# Patient Record
Sex: Female | Born: 1935
Health system: Southern US, Community
[De-identification: ages and names within clinical notes are randomized; demographics above are authoritative.]

## PROBLEM LIST (undated history)

## (undated) DIAGNOSIS — E785 Hyperlipidemia, unspecified: Secondary | ICD-10-CM

## (undated) DIAGNOSIS — F419 Anxiety disorder, unspecified: Secondary | ICD-10-CM

## (undated) DIAGNOSIS — I10 Essential (primary) hypertension: Secondary | ICD-10-CM

## (undated) DIAGNOSIS — K219 Gastro-esophageal reflux disease without esophagitis: Secondary | ICD-10-CM

## (undated) DIAGNOSIS — R42 Dizziness and giddiness: Secondary | ICD-10-CM

## (undated) DIAGNOSIS — H269 Unspecified cataract: Secondary | ICD-10-CM

## (undated) HISTORY — DX: Dizziness and giddiness: R42

## (undated) HISTORY — DX: Gastro-esophageal reflux disease without esophagitis: K21.9

## (undated) HISTORY — DX: Essential (primary) hypertension: I10

## (undated) HISTORY — DX: Anxiety disorder, unspecified: F41.9

## (undated) HISTORY — PX: SQUAMOUS CELL CARCINOMA EXCISION: SHX2433

## (undated) HISTORY — PX: CATARACT EXTRACTION: SUR2

## (undated) HISTORY — PX: EYE SURGERY: SHX253

## (undated) HISTORY — PX: KNEE ARTHROSCOPY: SUR90

## (undated) HISTORY — PX: ROTATOR CUFF REPAIR: SHX139

## (undated) HISTORY — DX: Unspecified cataract: H26.9

## (undated) HISTORY — DX: Hyperlipidemia, unspecified: E78.5

## (undated) HISTORY — PX: TONSILLECTOMY: SUR1361

---

## 1978-08-28 HISTORY — PX: ABDOMINAL HYSTERECTOMY: SHX81

## 1999-04-28 ENCOUNTER — Other Ambulatory Visit: Admission: RE | Admit: 1999-04-28 | Discharge: 1999-04-28 | Payer: Self-pay | Admitting: Family Medicine

## 1999-05-19 ENCOUNTER — Inpatient Hospital Stay (HOSPITAL_COMMUNITY): Admission: RE | Admit: 1999-05-19 | Discharge: 1999-05-21 | Payer: Self-pay | Admitting: Obstetrics and Gynecology

## 2002-07-29 ENCOUNTER — Other Ambulatory Visit: Admission: RE | Admit: 2002-07-29 | Discharge: 2002-07-29 | Payer: Self-pay | Admitting: Obstetrics and Gynecology

## 2003-10-05 ENCOUNTER — Encounter: Admission: RE | Admit: 2003-10-05 | Discharge: 2003-10-05 | Payer: Self-pay | Admitting: Orthopedic Surgery

## 2004-09-06 ENCOUNTER — Other Ambulatory Visit: Admission: RE | Admit: 2004-09-06 | Discharge: 2004-09-06 | Payer: Self-pay | Admitting: Obstetrics and Gynecology

## 2009-01-19 ENCOUNTER — Ambulatory Visit: Payer: Self-pay | Admitting: Internal Medicine

## 2009-01-27 ENCOUNTER — Ambulatory Visit: Payer: Self-pay | Admitting: Internal Medicine

## 2010-02-03 ENCOUNTER — Ambulatory Visit (HOSPITAL_BASED_OUTPATIENT_CLINIC_OR_DEPARTMENT_OTHER): Admission: RE | Admit: 2010-02-03 | Discharge: 2010-02-04 | Payer: Self-pay | Admitting: Orthopedic Surgery

## 2010-11-14 LAB — POCT I-STAT 4, (NA,K, GLUC, HGB,HCT)
HCT: 42 % (ref 36.0–46.0)
Hemoglobin: 14.3 g/dL (ref 12.0–15.0)
Potassium: 3.7 mEq/L (ref 3.5–5.1)
Sodium: 142 mEq/L (ref 135–145)

## 2011-02-27 ENCOUNTER — Ambulatory Visit (HOSPITAL_BASED_OUTPATIENT_CLINIC_OR_DEPARTMENT_OTHER)
Admission: RE | Admit: 2011-02-27 | Discharge: 2011-02-27 | Disposition: A | Discharge status: Home / Self Care | Payer: PRIVATE HEALTH INSURANCE | Source: Ambulatory Visit | Attending: Orthopedic Surgery | Admitting: Orthopedic Surgery

## 2011-02-27 DIAGNOSIS — IMO0002 Reserved for concepts with insufficient information to code with codable children: Secondary | ICD-10-CM | POA: Insufficient documentation

## 2011-02-27 DIAGNOSIS — R9431 Abnormal electrocardiogram [ECG] [EKG]: Secondary | ICD-10-CM | POA: Insufficient documentation

## 2011-02-27 DIAGNOSIS — X58XXXA Exposure to other specified factors, initial encounter: Secondary | ICD-10-CM | POA: Insufficient documentation

## 2011-02-27 DIAGNOSIS — Z01810 Encounter for preprocedural cardiovascular examination: Secondary | ICD-10-CM | POA: Insufficient documentation

## 2011-02-27 DIAGNOSIS — S83289A Other tear of lateral meniscus, current injury, unspecified knee, initial encounter: Secondary | ICD-10-CM | POA: Insufficient documentation

## 2011-02-27 DIAGNOSIS — Z01812 Encounter for preprocedural laboratory examination: Secondary | ICD-10-CM | POA: Insufficient documentation

## 2011-02-27 LAB — POCT I-STAT 4, (NA,K, GLUC, HGB,HCT): HCT: 41 % (ref 36.0–46.0)

## 2011-03-09 NOTE — Op Note (Signed)
  NAMELEXANDRA, RETTKE             ACCOUNT NO.:  [id number]  MEDICAL RECORD NO.:  [id number]  LOCATION:                                 FACILITY:  PHYSICIAN:  Marlowe Kays, M.D.  DATE OF BIRTH:  24-May-1933  DATE OF PROCEDURE:  02/27/2011 DATE OF DISCHARGE:                              OPERATIVE REPORT   PREOPERATIVE DIAGNOSES:  Torn medial and lateral menisci, left knee.  POSTOPERATIVE DIAGNOSIS:  Torn medial and lateral menisci, left knee.  OPERATION:  Left knee arthroscopy with partial medial and lateral meniscectomy.  SURGEON:  Marlowe Kays, MD  ASSISTANT:  Nurse.  ANESTHESIA:  General.  PATHOLOGY AND JUSTIFICATION FOR PROCEDURE:  Because of painful left knee, she had an MRI performed on Jan 18, 2011, demonstrating the torn menisci and consequently she is here today for the above-mentioned surgery.  PROCEDURE:  Satisfied general anesthesia, Ace wrap and knee support to right lower extremity, pneumatic tourniquet applied to left lower extremity with leg Esmarch'd out nonsterilely and tourniquet inflated to 300 mmHg, thigh stabilizer applied and left leg was prepped with DuraPrep from stabilizer to ankle and draped in sterile field.  Time-out performed.  Superior medial saline inflow.  First through the anterolateral portal, medial compartment of knee joint was evaluated. There was some reactive synovitis anteriorly, which I resected with a 3.5 shaver.  ACL was noted to be intact.  Posteriorly, she had fairly extensive tear involving the entire posterior third, which I resected back to a stable rim with a combination of baskets and shaving down until smooth with a 3.5 shaver.  When this had been completed, I looked up the medial gutter and suprapatellar area.  There was some minimal wear of the patella, which was pictured but nothing arthroscopically treatable was noted.  I then reversed portals laterally.  There was a tear that appeared to be a small amount of  the lateral portion of the ACL, but also the anterior horn of the lateral meniscus as depicted on the MRI.  She also had fairly extensive tear along the mid and posterior curve areas of the lateral meniscus.  I shaved all this down until smooth with 3.5 shaver and also trimmed a small amount of the posterior curve irregularity with baskets.  The joint was then irrigated until clear and all fluid possibly removed.  I closed 2 entry portals with 4-0 nylon and then injected 20 cc of 0.5% Marcaine with adrenaline through the inflow apparatus which I closed, and I closed this portal with 4-0 nylon as well.  Betadine, Adaptic, dry sterile dressing were applied.  Tourniquet was released and at the time of this dictation she was on her way to recovery room in satisfactory condition with no known complications.          ______________________________ Marlowe Kays, M.D.     JA/MEDQ  D:  02/27/2011  T:  02/27/2011  Job:  [id number]  Electronically Signed by Marlowe Kays M.D. on 03/09/2011 12:17:27 PM

## 2012-04-22 ENCOUNTER — Other Ambulatory Visit: Payer: Self-pay | Admitting: Family Medicine

## 2012-04-22 DIAGNOSIS — Z1231 Encounter for screening mammogram for malignant neoplasm of breast: Secondary | ICD-10-CM

## 2012-05-14 ENCOUNTER — Ambulatory Visit: Payer: PRIVATE HEALTH INSURANCE

## 2013-01-08 ENCOUNTER — Ambulatory Visit (INDEPENDENT_AMBULATORY_CARE_PROVIDER_SITE_OTHER): Payer: Medicare Other | Admitting: Physician Assistant

## 2013-01-08 ENCOUNTER — Encounter: Payer: Self-pay | Admitting: Physician Assistant

## 2013-01-08 VITALS — BP 131/73 | HR 79 | Temp 98.2°F | Ht 64.5 in | Wt 134.0 lb

## 2013-01-08 DIAGNOSIS — I10 Essential (primary) hypertension: Secondary | ICD-10-CM | POA: Insufficient documentation

## 2013-01-08 DIAGNOSIS — K219 Gastro-esophageal reflux disease without esophagitis: Secondary | ICD-10-CM | POA: Insufficient documentation

## 2013-01-08 DIAGNOSIS — F411 Generalized anxiety disorder: Secondary | ICD-10-CM

## 2013-01-08 DIAGNOSIS — N959 Unspecified menopausal and perimenopausal disorder: Secondary | ICD-10-CM

## 2013-01-08 DIAGNOSIS — E785 Hyperlipidemia, unspecified: Secondary | ICD-10-CM

## 2013-01-08 LAB — BASIC METABOLIC PANEL WITH GFR
BUN: 10 mg/dL (ref 6–23)
CO2: 26 mEq/L (ref 19–32)
Calcium: 9.4 mg/dL (ref 8.4–10.5)
Creat: 0.69 mg/dL (ref 0.50–1.10)

## 2013-01-08 LAB — HEPATIC FUNCTION PANEL
ALT: 10 U/L (ref 0–35)
AST: 17 U/L (ref 0–37)
Albumin: 3.9 g/dL (ref 3.5–5.2)
Alkaline Phosphatase: 66 U/L (ref 39–117)
Total Bilirubin: 0.5 mg/dL (ref 0.3–1.2)
Total Protein: 6.5 g/dL (ref 6.0–8.3)

## 2013-01-08 LAB — LIPID PANEL
Cholesterol: 253 mg/dL — ABNORMAL HIGH (ref 0–200)
HDL: 61 mg/dL (ref >39)
LDL Cholesterol: 153 mg/dL — ABNORMAL HIGH (ref 0–99)
Triglycerides: 197 mg/dL — ABNORMAL HIGH (ref <150)

## 2013-01-08 MED ORDER — DIAZEPAM 2 MG PO TABS: 2.0000 mg | ORAL_TABLET | Freq: Two times a day (BID) | ORAL | Status: DC | PRN

## 2013-01-08 MED ORDER — LISINOPRIL 10 MG PO TABS: 10.0000 mg | ORAL_TABLET | Freq: Every day | ORAL | Status: DC

## 2013-01-08 MED ORDER — ESTRADIOL 1 MG PO TABS: 1.0000 mg | ORAL_TABLET | Freq: Every day | ORAL | Status: DC

## 2013-01-08 MED ORDER — OMEPRAZOLE 20 MG PO CPDR: 20.0000 mg | DELAYED_RELEASE_CAPSULE | Freq: Every day | ORAL | Status: DC

## 2013-01-08 NOTE — Patient Instructions (Signed)

## 2013-01-08 NOTE — Progress Notes (Signed)
Subjective:     Patient ID: Christina Silva, female   DOB: [birth year], 79 y.o.   MRN: [id number]  Hypertension This is a chronic problem. The current episode started more than 1 year ago. The problem has been resolved since onset. The problem is controlled. Risk factors for coronary artery disease include dyslipidemia, family history and post-menopausal state. Past treatments include ACE inhibitors and diuretics. There are no compliance problems.      Review of Systems  All other systems reviewed and are negative.       Objective:   Physical Exam  Neck: Trachea normal. No JVD present. Carotid bruit is not present.  Cardiovascular: Normal rate, regular rhythm, S1 normal, S2 normal and normal heart sounds.   Pulmonary/Chest: Effort normal and breath sounds normal.  No lower ext edema     Assessment:     1. GERD (gastroesophageal reflux disease)   2. Other and unspecified hyperlipidemia   3. HTN (hypertension)   4. Anxiety state, unspecified   5. Menopausal and perimenopausal disorder        Plan:     Cont current meds Complete labs today Appt for mammo/dexa Pt refuses colonoscopy F/U in 6 mos

## 2013-01-14 ENCOUNTER — Telehealth: Payer: Self-pay | Admitting: Physician Assistant

## 2013-01-14 NOTE — Telephone Encounter (Signed)
Called to discuss labs but patient was not at home.  Left message with her husband to have her call back tomorrow for results.

## 2013-01-15 ENCOUNTER — Telehealth: Payer: Self-pay | Admitting: Physician Assistant

## 2013-01-15 NOTE — Telephone Encounter (Signed)
Patient aware. Copy is sent in the mail she will review it and let us know later is she wants to go on a statin

## 2013-01-16 ENCOUNTER — Encounter: Payer: Self-pay | Admitting: *Deleted

## 2013-03-12 ENCOUNTER — Other Ambulatory Visit: Payer: Medicare Other

## 2013-03-12 ENCOUNTER — Ambulatory Visit: Payer: Medicare Other

## 2013-06-30 ENCOUNTER — Ambulatory Visit (INDEPENDENT_AMBULATORY_CARE_PROVIDER_SITE_OTHER): Payer: Medicare Other | Admitting: General Practice

## 2013-06-30 ENCOUNTER — Encounter: Payer: Self-pay | Admitting: General Practice

## 2013-06-30 VITALS — BP 140/76 | HR 80 | Temp 97.3°F | Ht 64.5 in | Wt 137.0 lb

## 2013-06-30 DIAGNOSIS — F411 Generalized anxiety disorder: Secondary | ICD-10-CM

## 2013-06-30 DIAGNOSIS — I10 Essential (primary) hypertension: Secondary | ICD-10-CM

## 2013-06-30 DIAGNOSIS — Z780 Asymptomatic menopausal state: Secondary | ICD-10-CM

## 2013-06-30 DIAGNOSIS — E785 Hyperlipidemia, unspecified: Secondary | ICD-10-CM

## 2013-06-30 DIAGNOSIS — N951 Menopausal and female climacteric states: Secondary | ICD-10-CM

## 2013-06-30 DIAGNOSIS — K219 Gastro-esophageal reflux disease without esophagitis: Secondary | ICD-10-CM

## 2013-06-30 DIAGNOSIS — Z09 Encounter for follow-up examination after completed treatment for conditions other than malignant neoplasm: Secondary | ICD-10-CM

## 2013-06-30 LAB — POCT CBC
Granulocyte percent: 55.6 %G (ref 37–80)
Lymph, poc: 1.6 (ref 0.6–3.4)
MCV: 86.1 fL (ref 80–97)
MPV: 6.7 fL (ref 0–99.8)
POC LYMPH PERCENT: 35.6 %L (ref 10–50)
Platelet Count, POC: 315 10*3/uL (ref 142–424)
RBC: 4.7 M/uL (ref 4.04–5.48)
RDW, POC: 13.1 %
WBC: 4.5 10*3/uL — AB (ref 4.6–10.2)

## 2013-06-30 MED ORDER — OMEPRAZOLE 20 MG PO CPDR: 20.0000 mg | DELAYED_RELEASE_CAPSULE | Freq: Every day | ORAL | Status: DC

## 2013-06-30 MED ORDER — LISINOPRIL 10 MG PO TABS: 10.0000 mg | ORAL_TABLET | Freq: Every day | ORAL | Status: DC

## 2013-06-30 MED ORDER — ESTRADIOL 1 MG PO TABS: 1.0000 mg | ORAL_TABLET | Freq: Every day | ORAL | Status: DC

## 2013-06-30 MED ORDER — DIAZEPAM 2 MG PO TABS: 2.0000 mg | ORAL_TABLET | Freq: Two times a day (BID) | ORAL | Status: DC | PRN

## 2013-06-30 NOTE — Patient Instructions (Signed)

## 2013-06-30 NOTE — Progress Notes (Signed)
  Subjective:    Patient ID: Christina Silva, female    DOB: 1932-08-31, 79 y.o.   MRN: [id number]  HPI Patient presents today for chronic health follow up. She has a history of hypertension, anxiety, vasomotor symptoms, and hyperlipidemia. She reports medications are effective and mood swings are well controlled.     Review of Systems  Constitutional: Negative for fever and chills.  Respiratory: Negative for cough, chest tightness, shortness of breath and wheezing.   Cardiovascular: Negative for chest pain and palpitations.  Gastrointestinal: Negative for abdominal pain, diarrhea, constipation and blood in stool.  Genitourinary: Negative for dysuria, hematuria and difficulty urinating.  Musculoskeletal: Negative for back pain, neck pain and neck stiffness.  Neurological: Negative for dizziness, weakness and headaches.       Objective:   Physical Exam  Constitutional: She is oriented to person, place, and time. She appears well-developed and well-nourished.  HENT:  Head: Normocephalic and atraumatic.  Right Ear: External ear normal.  Left Ear: External ear normal.  Nose: Nose normal.  Mouth/Throat: Oropharynx is clear and moist.  Eyes: EOM are normal. Pupils are equal, round, and reactive to light.  Neck: Normal range of motion. Neck supple. No thyromegaly present.  Cardiovascular: Normal rate, regular rhythm and normal heart sounds.   Pulmonary/Chest: Effort normal and breath sounds normal. No respiratory distress. She exhibits no tenderness.  Abdominal: Soft. Bowel sounds are normal. She exhibits no distension. There is no tenderness.  Musculoskeletal: She exhibits no edema and no tenderness.  Lymphadenopathy:    She has no cervical adenopathy.  Neurological: She is alert and oriented to person, place, and time.  Skin: Skin is warm and dry.  Psychiatric: She has a normal mood and affect.          Assessment & Plan:  1. Hyperlipidemia  - NMR, lipoprofile  2.  Hypertension  - CMP14+EGFR - lisinopril (PRINIVIL,ZESTRIL) 10 MG tablet; Take 1 tablet (10 mg total) by mouth daily.  Dispense: 30 tablet; Refill: 5  3. GERD (gastroesophageal reflux disease)  - omeprazole (PRILOSEC) 20 MG capsule; Take 1 capsule (20 mg total) by mouth daily.  Dispense: 30 capsule; Refill: 5  4. Generalized anxiety disorder  - diazepam (VALIUM) 2 MG tablet; Take 1 tablet (2 mg total) by mouth every 12 (twelve) hours as needed for anxiety.  Dispense: 60 tablet; Refill: 1  5. Follow-up exam, 3-6 months since previous exam  - POCT CBC  6. Menopause   7. Menopausal symptoms  - estradiol (ESTRACE) 1 MG tablet; Take 1 tablet (1 mg total) by mouth daily.  Dispense: 30 tablet; Refill: 5 -Continue all current medications Labs pending F/u in 6 months Discussed exercise and diet  Patient verbalized understanding Coralie Keens, FNP-C

## 2013-07-02 LAB — CMP14+EGFR
ALT: 9 IU/L (ref 0–32)
AST: 15 IU/L (ref 0–40)
CO2: 25 mmol/L (ref 18–29)
Calcium: 9.7 mg/dL (ref 8.6–10.2)
Chloride: 101 mmol/L (ref 97–108)
GFR calc non Af Amer: 84 mL/min/{1.73_m2} (ref >59)
Glucose: 89 mg/dL (ref 65–99)
Potassium: 4.6 mmol/L (ref 3.5–5.2)
Sodium: 141 mmol/L (ref 134–144)
Total Protein: 6.6 g/dL (ref 6.0–8.5)

## 2013-07-02 LAB — NMR, LIPOPROFILE
HDL Cholesterol by NMR: 71 mg/dL (ref >=40)
LDL Particle Number: 2238 nmol/L — ABNORMAL HIGH (ref <1000)
LDLC SERPL CALC-MCNC: 138 mg/dL — ABNORMAL HIGH (ref <100)
Small LDL Particle Number: 801 nmol/L — ABNORMAL HIGH (ref <=527)
Triglycerides by NMR: 247 mg/dL — ABNORMAL HIGH (ref <150)

## 2013-07-08 ENCOUNTER — Other Ambulatory Visit: Payer: Self-pay

## 2013-07-08 DIAGNOSIS — F411 Generalized anxiety disorder: Secondary | ICD-10-CM

## 2013-07-08 NOTE — Telephone Encounter (Signed)
Done on 06/30/13  Mae but on [employer] so it didn't go through  Last seen 06/30/13  Mae  If approved route to nurse to call into [facility]

## 2013-07-09 MED ORDER — DIAZEPAM 2 MG PO TABS: 2.0000 mg | ORAL_TABLET | Freq: Two times a day (BID) | ORAL | Status: DC | PRN

## 2013-07-09 NOTE — Telephone Encounter (Signed)
Please call into pharmacy. thx 

## 2013-07-10 NOTE — Telephone Encounter (Signed)
Rx called to pharmacy

## 2013-07-15 ENCOUNTER — Other Ambulatory Visit: Payer: Self-pay | Admitting: *Deleted

## 2013-07-15 DIAGNOSIS — I10 Essential (primary) hypertension: Secondary | ICD-10-CM

## 2013-07-15 MED ORDER — LISINOPRIL 10 MG PO TABS: 10.0000 mg | ORAL_TABLET | Freq: Every day | ORAL | Status: DC

## 2013-12-11 ENCOUNTER — Other Ambulatory Visit: Payer: Self-pay | Admitting: Nurse Practitioner

## 2014-01-07 ENCOUNTER — Other Ambulatory Visit: Payer: Self-pay | Admitting: General Practice

## 2014-02-09 ENCOUNTER — Other Ambulatory Visit: Payer: Self-pay | Admitting: Nurse Practitioner

## 2014-02-09 ENCOUNTER — Other Ambulatory Visit: Payer: Self-pay | Admitting: General Practice

## 2014-02-26 ENCOUNTER — Encounter (INDEPENDENT_AMBULATORY_CARE_PROVIDER_SITE_OTHER): Payer: Self-pay

## 2014-02-26 ENCOUNTER — Ambulatory Visit (INDEPENDENT_AMBULATORY_CARE_PROVIDER_SITE_OTHER): Payer: Medicare Other | Admitting: Nurse Practitioner

## 2014-02-26 ENCOUNTER — Encounter: Payer: Self-pay | Admitting: Nurse Practitioner

## 2014-02-26 VITALS — BP 104/56 | HR 68 | Temp 97.8°F | Ht 64.0 in | Wt 136.0 lb

## 2014-02-26 DIAGNOSIS — K219 Gastro-esophageal reflux disease without esophagitis: Secondary | ICD-10-CM

## 2014-02-26 DIAGNOSIS — E785 Hyperlipidemia, unspecified: Secondary | ICD-10-CM

## 2014-02-26 DIAGNOSIS — I10 Essential (primary) hypertension: Secondary | ICD-10-CM

## 2014-02-26 DIAGNOSIS — F411 Generalized anxiety disorder: Secondary | ICD-10-CM

## 2014-02-26 MED ORDER — OMEPRAZOLE 20 MG PO CPDR: DELAYED_RELEASE_CAPSULE | ORAL | Status: DC

## 2014-02-26 MED ORDER — DIAZEPAM 2 MG PO TABS: 2.0000 mg | ORAL_TABLET | Freq: Two times a day (BID) | ORAL | Status: DC | PRN

## 2014-02-26 MED ORDER — LISINOPRIL 10 MG PO TABS: ORAL_TABLET | ORAL | Status: DC

## 2014-02-26 NOTE — Patient Instructions (Signed)

## 2014-02-26 NOTE — Progress Notes (Signed)
Subjective:    Patient ID: Christina Silva, female    DOB: 03/23/1933, 80 y.o.   MRN: [id number]  Patient here today for follow up of chronic medical problems.  Hypertension This is a chronic problem. The current episode started more than 1 year ago. The problem has been resolved since onset. The problem is controlled. Pertinent negatives include no blurred vision, chest pain, headaches, neck pain, palpitations, shortness of breath or sweats. Risk factors for coronary artery disease include post-menopausal state. Past treatments include ACE inhibitors. The current treatment provides moderate improvement. There are no compliance problems.   Hyperlipidemia This is a chronic problem. The current episode started more than 1 year ago. The problem is uncontrolled. Recent lipid tests were reviewed and are high. She has no history of diabetes, hypothyroidism or obesity. Pertinent negatives include no chest pain or shortness of breath. She is currently on no antihyperlipidemic treatment. There are no compliance problems.   GERD Patient taking omeprazole daily which keeps her symptoms under control GAD Valium as needed- no side effects  Review of Systems  Eyes: Negative for blurred vision.  Respiratory: Negative for shortness of breath.   Cardiovascular: Negative for chest pain and palpitations.  Musculoskeletal: Negative for neck pain.  Neurological: Negative for headaches.       Objective:   Physical Exam  Constitutional: She is oriented to person, place, and time. She appears well-developed and well-nourished.  HENT:  Nose: Nose normal.  Mouth/Throat: Oropharynx is clear and moist.  Eyes: EOM are normal.  Neck: Trachea normal, normal range of motion and full passive range of motion without pain. Neck supple. No JVD present. Carotid bruit is not present. No thyromegaly present.  Cardiovascular: Normal rate, regular rhythm, normal heart sounds and intact distal pulses.  Exam reveals no  gallop and no friction rub.   No murmur heard. Pulmonary/Chest: Effort normal and breath sounds normal.  Abdominal: Soft. Bowel sounds are normal. She exhibits no distension and no mass. There is no tenderness.  Musculoskeletal: Normal range of motion.  Lymphadenopathy:    She has no cervical adenopathy.  Neurological: She is alert and oriented to person, place, and time. She has normal reflexes.  Skin: Skin is warm and dry.  Psychiatric: She has a normal mood and affect. Her behavior is normal. Judgment and thought content normal.   BP 104/56  Pulse 68  Temp(Src) 97.8 F (36.6 C) (Oral)  Ht 5' 4" (1.626 m)  Wt 136 lb (61.689 kg)  BMI 23.33 kg/m2        Assessment & Plan:   1. Essential hypertension   2. Other and unspecified hyperlipidemia   3. Gastroesophageal reflux disease without esophagitis   4. Anxiety state, unspecified   5. Generalized anxiety disorder    Orders Placed This Encounter  Procedures  . CMP14+EGFR  . NMR, lipoprofile   Meds ordered this encounter  Medications  . diazepam (VALIUM) 2 MG tablet    Sig: Take 1 tablet (2 mg total) by mouth every 12 (twelve) hours as needed for anxiety.    Dispense:  60 tablet    Refill:  1    Order Specific Question:  Supervising Provider    Answer:  Chipper Herb [[id number]]  . omeprazole (PRILOSEC) 20 MG capsule    Sig: TAKE ONE (1) CAPSULE EACH DAY    Dispense:  30 capsule    Refill:  5    Order Specific Question:  Supervising Provider    Answer:  Redge Gainer W [[id number]]  . lisinopril (PRINIVIL,ZESTRIL) 10 MG tablet    Sig: TAKE ONE (1) TABLET EACH DAY    Dispense:  30 tablet    Refill:  5    Order Specific Question:  Supervising Provider    Answer:  Chipper Herb [[id number]]    Labs pending Health maintenance reviewed Diet and exercise encouraged Continue all meds Follow up  In 3 months    [place], FNP

## 2014-02-27 LAB — CMP14+EGFR
A/G RATIO: 1.8 (ref 1.1–2.5)
ALBUMIN: 4.2 g/dL (ref 3.5–4.8)
ALT: 10 IU/L (ref 0–32)
AST: 18 IU/L (ref 0–40)
Alkaline Phosphatase: 64 IU/L (ref 39–117)
BUN/Creatinine Ratio: 19 (ref 11–26)
BUN: 12 mg/dL (ref 8–27)
CALCIUM: 9.5 mg/dL (ref 8.7–10.3)
CO2: 24 mmol/L (ref 18–29)
CREATININE: 0.62 mg/dL (ref 0.57–1.00)
Chloride: 102 mmol/L (ref 97–108)
GFR calc Af Amer: 100 mL/min/{1.73_m2} (ref >59)
GFR, EST NON AFRICAN AMERICAN: 87 mL/min/{1.73_m2} (ref >59)
GLOBULIN, TOTAL: 2.3 g/dL (ref 1.5–4.5)
Glucose: 89 mg/dL (ref 65–99)
Potassium: 4.6 mmol/L (ref 3.5–5.2)
SODIUM: 139 mmol/L (ref 134–144)
TOTAL PROTEIN: 6.5 g/dL (ref 6.0–8.5)
Total Bilirubin: 0.3 mg/dL (ref 0.0–1.2)

## 2014-02-27 LAB — NMR, LIPOPROFILE
CHOLESTEROL: 242 mg/dL — AB (ref 100–199)
HDL CHOLESTEROL BY NMR: 68 mg/dL (ref >39)
HDL PARTICLE NUMBER: 46.9 umol/L (ref >=30.5)
LDL Particle Number: 1317 nmol/L — ABNORMAL HIGH (ref <1000)
LDL Size: 19.7 nm (ref >20.5)
LDLC SERPL CALC-MCNC: 144 mg/dL — ABNORMAL HIGH (ref 0–99)
LP-IR Score: 54 — ABNORMAL HIGH (ref <=45)
SMALL LDL PARTICLE NUMBER: 480 nmol/L (ref <=527)
TRIGLYCERIDES BY NMR: 149 mg/dL (ref 0–149)

## 2014-03-02 ENCOUNTER — Telehealth: Payer: Self-pay | Admitting: *Deleted

## 2014-03-02 NOTE — Telephone Encounter (Signed)
Aware of results. 

## 2014-03-02 NOTE — Telephone Encounter (Signed)
Message copied by Shelbie Ammons on Mon Mar 02, 2014  3:49 PM ------      Message from: [name]      Created: Mon Mar 02, 2014  7:40 AM       Kidney and liver function stable      LDL particle numbers are much better but LDL unchanged      Continue current meds- low fat diet and exercise and recheck in 3 months      If no better in 3 months we may need to  Make changes to meds ------

## 2014-03-16 ENCOUNTER — Other Ambulatory Visit: Payer: Self-pay | Admitting: General Practice

## 2014-07-09 ENCOUNTER — Other Ambulatory Visit: Payer: Self-pay | Admitting: Nurse Practitioner

## 2014-08-18 ENCOUNTER — Other Ambulatory Visit: Payer: Self-pay | Admitting: Nurse Practitioner

## 2014-08-18 ENCOUNTER — Telehealth: Payer: Self-pay | Admitting: *Deleted

## 2014-08-18 NOTE — Telephone Encounter (Signed)
Please call in valium with 0 refills

## 2014-08-18 NOTE — Telephone Encounter (Signed)
Last seen 02/26/14  MMM If approved route to nurse to call into [facility]

## 2014-08-18 NOTE — Telephone Encounter (Signed)
Script for valium called in to [facility].

## 2014-10-13 ENCOUNTER — Other Ambulatory Visit: Payer: Self-pay | Admitting: Nurse Practitioner

## 2014-10-13 NOTE — Telephone Encounter (Signed)
Last seen 02/26/14  MMM

## 2014-10-13 NOTE — Telephone Encounter (Signed)
no more refills without being seen  

## 2014-11-16 ENCOUNTER — Other Ambulatory Visit: Payer: Self-pay | Admitting: Nurse Practitioner

## 2014-11-17 DIAGNOSIS — C44320 Squamous cell carcinoma of skin of unspecified parts of face: Secondary | ICD-10-CM | POA: Diagnosis not present

## 2014-11-17 DIAGNOSIS — D485 Neoplasm of uncertain behavior of skin: Secondary | ICD-10-CM | POA: Diagnosis not present

## 2014-11-17 DIAGNOSIS — D044 Carcinoma in situ of skin of scalp and neck: Secondary | ICD-10-CM | POA: Diagnosis not present

## 2014-11-17 DIAGNOSIS — C44329 Squamous cell carcinoma of skin of other parts of face: Secondary | ICD-10-CM | POA: Diagnosis not present

## 2014-11-17 DIAGNOSIS — D0461 Carcinoma in situ of skin of right upper limb, including shoulder: Secondary | ICD-10-CM | POA: Diagnosis not present

## 2014-11-17 DIAGNOSIS — L570 Actinic keratosis: Secondary | ICD-10-CM | POA: Diagnosis not present

## 2014-11-24 ENCOUNTER — Encounter: Payer: Self-pay | Admitting: Family

## 2014-11-24 ENCOUNTER — Ambulatory Visit (INDEPENDENT_AMBULATORY_CARE_PROVIDER_SITE_OTHER): Payer: Medicare Other | Admitting: Family

## 2014-11-24 VITALS — BP 125/69 | HR 83 | Temp 98.4°F | Ht 64.0 in | Wt 137.4 lb

## 2014-11-24 DIAGNOSIS — T7840XA Allergy, unspecified, initial encounter: Secondary | ICD-10-CM

## 2014-11-24 MED ORDER — METHYLPREDNISOLONE ACETATE 80 MG/ML IJ SUSP
80.0000 mg | Freq: Once | INTRAMUSCULAR | Status: AC
  Administered 2014-11-24: 80 mg via INTRAMUSCULAR

## 2014-11-24 MED ORDER — METOPROLOL SUCCINATE ER 25 MG PO TB24: 25.0000 mg | ORAL_TABLET | Freq: Every day | ORAL | Status: DC

## 2014-11-24 MED ORDER — METHYLPREDNISOLONE (PAK) 4 MG PO TABS: ORAL_TABLET | ORAL | Status: DC

## 2014-11-24 NOTE — Patient Instructions (Signed)
Anaphylactic Reaction An anaphylactic reaction is a sudden, severe allergic reaction that involves the whole body. It can be life threatening. A hospital stay is often required. People with asthma, eczema, or hay fever are slightly more likely to have an anaphylactic reaction. CAUSES  An anaphylactic reaction may be caused by anything to which you are allergic. After being exposed to the allergic substance, your immune system becomes sensitized to it. When you are exposed to that allergic substance again, an allergic reaction can occur. Common causes of an anaphylactic reaction include:  Medicines.  Foods, especially peanuts, wheat, shellfish, milk, and eggs.  Insect bites or stings.  Blood products.  Chemicals, such as dyes, latex, and contrast material used for imaging tests. SYMPTOMS  When an allergic reaction occurs, the body releases histamine and other substances. These substances cause symptoms such as tightening of the airway. Symptoms often develop within seconds or minutes of exposure. Symptoms may include:  Skin rash or hives.  Itching.  Chest tightness.  Swelling of the eyes, tongue, or lips.  Trouble breathing or swallowing.  Lightheadedness or fainting.  Anxiety or confusion.  Stomach pains, vomiting, or diarrhea.  Nasal congestion.  A fast or irregular heartbeat (palpitations). DIAGNOSIS  Diagnosis is based on your history of recent exposure to allergic substances, your symptoms, and a physical exam. Your caregiver may also perform blood or urine tests to confirm the diagnosis. TREATMENT  Epinephrine medicine is the main treatment for an anaphylactic reaction. Other medicines that may be used for treatment include antihistamines, steroids, and albuterol. In severe cases, fluids and medicine to support blood pressure may be given through an intravenous line (IV). Even if you improve after treatment, you need to be observed to make sure your condition does not get  worse. This may require a stay in the hospital. HOME CARE INSTRUCTIONS   Wear a medical alert bracelet or necklace stating your allergy.  You and your family must learn how to use an anaphylaxis kit or give an epinephrine injection to temporarily treat an emergency allergic reaction. Always carry your epinephrine injection or anaphylaxis kit with you. This can be lifesaving if you have a severe reaction.  Do not drive or perform tasks after treatment until the medicines used to treat your reaction have worn off, or until your caregiver says it is okay.  If you have hives or a rash:  Take medicines as directed by your caregiver.  You may use an over-the-counter antihistamine (diphenhydramine) as needed.  Apply cold compresses to the skin or take baths in cool water. Avoid hot baths or showers. SEEK MEDICAL CARE IF:   You develop symptoms of an allergic reaction to a new substance. Symptoms may start right away or minutes later.  You develop a rash, hives, or itching.  You develop new symptoms. SEEK IMMEDIATE MEDICAL CARE IF:   You have swelling of the mouth, difficulty breathing, or wheezing.  You have a tight feeling in your chest or throat.  You develop hives, swelling, or itching all over your body.  You develop severe vomiting or diarrhea.  You feel faint or pass out. This is an emergency. Use your epinephrine injection or anaphylaxis kit as you have been instructed. Call your local emergency services (911 in [place] Even if you improve after the injection, you need to be examined at a hospital emergency department. MAKE SURE YOU:   Understand these instructions.  Will watch your condition.  Will get help right away if you are not   doing well or get worse. Document Released: 08/14/2005 Document Revised: 08/19/2013 Document Reviewed: 11/15/2011 Exit[employer] Patient Information 2015 [employer]. This information is not intended to replace advice given to you by your health  care provider. Make sure you discuss any questions you have with your health care provider.  

## 2014-11-24 NOTE — Progress Notes (Signed)
   Subjective:    Patient ID: [name], female    DOB: 1932/09/13, 81 y.o.   MRN: [id number]  Allergic Reaction This is a new problem. The current episode started today. The problem occurs constantly. The problem is unchanged. The problem is moderate. Pertinent negatives include no chest pain, chest pressure, diarrhea, eye redness, globus sensation, itching, rash, vomiting or wheezing. (Pt's top lip swollen) Swelling is present on the lips. The treatment provided mild relief.      Review of Systems  Constitutional: Negative.   HENT: Negative.   Eyes: Negative.  Negative for redness.  Respiratory: Negative.  Negative for shortness of breath and wheezing.   Cardiovascular: Negative.  Negative for chest pain and palpitations.  Gastrointestinal: Negative.  Negative for vomiting and diarrhea.  Endocrine: Negative.   Genitourinary: Negative.   Musculoskeletal: Negative.   Skin: Negative for itching and rash.  Neurological: Negative.  Negative for headaches.  Hematological: Negative.   Psychiatric/Behavioral: Negative.   All other systems reviewed and are negative.      Objective:   Physical Exam  Constitutional: She is oriented to person, place, and time. She appears well-developed and well-nourished. No distress.  HENT:  Head: Normocephalic and atraumatic.  Right Ear: External ear normal.  Mouth/Throat: Oropharynx is clear and moist.  Top lip very swollen and tight  Eyes: Pupils are equal, round, and reactive to light.  Neck: Normal range of motion. Neck supple. No thyromegaly present.  Cardiovascular: Normal rate, regular rhythm, normal heart sounds and intact distal pulses.   No murmur heard. Pulmonary/Chest: Effort normal and breath sounds normal. No respiratory distress. She has no wheezes.  Abdominal: Soft. Bowel sounds are normal. She exhibits no distension. There is no tenderness.  Musculoskeletal: Normal range of motion. She exhibits no edema or tenderness.    Neurological: She is alert and oriented to person, place, and time. She has normal reflexes. No cranial nerve deficit.  Skin: Skin is warm and dry.  Psychiatric: She has a normal mood and affect. Her behavior is normal. Judgment and thought content normal.  Vitals reviewed.   BP 125/69 mmHg  Pulse 83  Temp(Src) 98.4 F (36.9 C) (Oral)  Ht 5\' 4"  (1.626 m)  Wt 137 lb 6.4 oz (62.324 kg)  BMI 23.57 kg/m2       Assessment & Plan:  1. Allergic reaction, initial encounter Avoid allergens- I believe this is lisinopril- Added to pt's allergy list- Pt to be careful of eating or doing anything she has done the last 12 hours or so to makes sure this a reaction to lisinopril -Pt to take benadryl prn -RTO in 2 weeks to look at BP - methylPREDNISolone acetate (DEPO-MEDROL) injection 80 mg; Inject 1 mL (80 mg total) into the muscle once. - methylPREDNIsolone (MEDROL DOSPACK) 4 MG tablet; follow package directions  Dispense: 21 tablet; Refill: 0  Evelina Dun, FNP

## 2014-12-22 DIAGNOSIS — C44329 Squamous cell carcinoma of skin of other parts of face: Secondary | ICD-10-CM | POA: Diagnosis not present

## 2015-01-11 ENCOUNTER — Other Ambulatory Visit: Payer: Self-pay | Admitting: Nurse Practitioner

## 2015-02-04 ENCOUNTER — Other Ambulatory Visit: Payer: Self-pay | Admitting: Nurse Practitioner

## 2015-03-14 ENCOUNTER — Emergency Department (HOSPITAL_COMMUNITY): Payer: Medicare Other

## 2015-03-14 ENCOUNTER — Encounter (HOSPITAL_COMMUNITY): Payer: Self-pay | Admitting: *Deleted

## 2015-03-14 ENCOUNTER — Emergency Department (HOSPITAL_COMMUNITY)
Admission: EM | Admit: 2015-03-14 | Discharge: 2015-03-15 | Disposition: A | Discharge status: Home / Self Care | Payer: Medicare Other | Attending: Emergency Medicine | Admitting: Emergency Medicine

## 2015-03-14 DIAGNOSIS — R0602 Shortness of breath: Secondary | ICD-10-CM | POA: Diagnosis not present

## 2015-03-14 DIAGNOSIS — Z8639 Personal history of other endocrine, nutritional and metabolic disease: Secondary | ICD-10-CM | POA: Diagnosis not present

## 2015-03-14 DIAGNOSIS — Z79899 Other long term (current) drug therapy: Secondary | ICD-10-CM | POA: Insufficient documentation

## 2015-03-14 DIAGNOSIS — R42 Dizziness and giddiness: Secondary | ICD-10-CM

## 2015-03-14 DIAGNOSIS — R531 Weakness: Secondary | ICD-10-CM | POA: Diagnosis present

## 2015-03-14 DIAGNOSIS — I10 Essential (primary) hypertension: Secondary | ICD-10-CM | POA: Insufficient documentation

## 2015-03-14 DIAGNOSIS — K219 Gastro-esophageal reflux disease without esophagitis: Secondary | ICD-10-CM | POA: Diagnosis not present

## 2015-03-14 DIAGNOSIS — Z87891 Personal history of nicotine dependence: Secondary | ICD-10-CM | POA: Insufficient documentation

## 2015-03-14 DIAGNOSIS — R404 Transient alteration of awareness: Secondary | ICD-10-CM | POA: Diagnosis not present

## 2015-03-14 LAB — CBC WITH DIFFERENTIAL/PLATELET
Basophils Absolute: 0 10*3/uL (ref 0.0–0.1)
Basophils Relative: 1 % (ref 0–1)
EOS PCT: 3 % (ref 0–5)
Eosinophils Absolute: 0.2 10*3/uL (ref 0.0–0.7)
HEMATOCRIT: 39.4 % (ref 36.0–46.0)
HEMOGLOBIN: 12.9 g/dL (ref 12.0–15.0)
LYMPHS ABS: 2.2 10*3/uL (ref 0.7–4.0)
Lymphocytes Relative: 36 % (ref 12–46)
MCH: 29.5 pg (ref 26.0–34.0)
MCHC: 32.7 g/dL (ref 30.0–36.0)
MCV: 90.2 fL (ref 78.0–100.0)
MONO ABS: 0.5 10*3/uL (ref 0.1–1.0)
MONOS PCT: 9 % (ref 3–12)
NEUTROS ABS: 3.2 10*3/uL (ref 1.7–7.7)
NEUTROS PCT: 51 % (ref 43–77)
Platelets: 269 10*3/uL (ref 150–400)
RBC: 4.37 MIL/uL (ref 3.87–5.11)
RDW: 13 % (ref 11.5–15.5)
WBC: 6.1 10*3/uL (ref 4.0–10.5)

## 2015-03-14 LAB — BRAIN NATRIURETIC PEPTIDE: B Natriuretic Peptide: 49.4 pg/mL (ref 0.0–100.0)

## 2015-03-14 LAB — COMPREHENSIVE METABOLIC PANEL
ALBUMIN: 3.3 g/dL — AB (ref 3.5–5.0)
ALT: 13 U/L — ABNORMAL LOW (ref 14–54)
AST: 21 U/L (ref 15–41)
Alkaline Phosphatase: 53 U/L (ref 38–126)
Anion gap: 8 (ref 5–15)
BUN: 8 mg/dL (ref 6–20)
CALCIUM: 8.9 mg/dL (ref 8.9–10.3)
CO2: 24 mmol/L (ref 22–32)
CREATININE: 0.72 mg/dL (ref 0.44–1.00)
Chloride: 106 mmol/L (ref 101–111)
GFR calc Af Amer: >60 mL/min (ref >60)
GFR calc non Af Amer: >60 mL/min (ref >60)
Glucose, Bld: 110 mg/dL — ABNORMAL HIGH (ref 65–99)
Potassium: 3.3 mmol/L — ABNORMAL LOW (ref 3.5–5.1)
Sodium: 138 mmol/L (ref 135–145)
Total Bilirubin: 0.6 mg/dL (ref 0.3–1.2)
Total Protein: 6.3 g/dL — ABNORMAL LOW (ref 6.5–8.1)

## 2015-03-14 LAB — TROPONIN I

## 2015-03-14 MED ORDER — LABETALOL HCL 5 MG/ML IV SOLN
10.0000 mg | Freq: Once | INTRAVENOUS | Status: AC
  Administered 2015-03-14: 10 mg via INTRAVENOUS
  Filled 2015-03-14: qty 4

## 2015-03-14 NOTE — Discharge Instructions (Signed)
Dizziness °Dizziness is a common problem. It is a feeling of unsteadiness or light-headedness. You may feel like you are about to faint. Dizziness can lead to injury if you stumble or fall. A person of any age group can suffer from dizziness, but dizziness is more common in older adults. °CAUSES  °Dizziness can be caused by many different things, including: °· Middle ear problems. °· Standing for too long. °· Infections. °· An allergic reaction. °· Aging. °· An emotional response to something, such as the sight of blood. °· Side effects of medicines. °· Tiredness. °· Problems with circulation or blood pressure. °· Excessive use of alcohol or medicines, or illegal drug use. °· Breathing too fast (hyperventilation). °· An irregular heart rhythm (arrhythmia). °· A low red blood cell count (anemia). °· Pregnancy. °· Vomiting, diarrhea, fever, or other illnesses that cause body fluid loss (dehydration). °· Diseases or conditions such as Parkinson's disease, high blood pressure (hypertension), diabetes, and thyroid problems. °· Exposure to extreme heat. °DIAGNOSIS  °Your health care provider will ask about your symptoms, perform a physical exam, and perform an electrocardiogram (ECG) to record the electrical activity of your heart. Your health care provider may also perform other heart or blood tests to determine the cause of your dizziness. These may include: °· Transthoracic echocardiogram (TTE). During echocardiography, sound waves are used to evaluate how blood flows through your heart. °· Transesophageal echocardiogram (TEE). °· Cardiac monitoring. This allows your health care provider to monitor your heart rate and rhythm in real time. °· Holter monitor. This is a portable device that records your heartbeat and can help diagnose heart arrhythmias. It allows your health care provider to track your heart activity for several days if needed. °· Stress tests by exercise or by giving medicine that makes the heart beat  faster. °TREATMENT  °Treatment of dizziness depends on the cause of your symptoms and can vary greatly. °HOME CARE INSTRUCTIONS  °· Drink enough fluids to keep your urine clear or pale yellow. This is especially important in very hot weather. In older adults, it is also important in cold weather. °· Take your medicine exactly as directed if your dizziness is caused by medicines. When taking blood pressure medicines, it is especially important to get up slowly. °· Rise slowly from chairs and steady yourself until you feel okay. °· In the morning, first sit up on the side of the bed. When you feel okay, stand slowly while holding onto something until you know your balance is fine. °· Move your legs often if you need to stand in one place for a long time. Tighten and relax your muscles in your legs while standing. °· Have someone stay with you for 1-2 days if dizziness continues to be a problem. Do this until you feel you are well enough to stay alone. Have the person call your health care provider if he or she notices changes in you that are concerning. °· Do not drive or use heavy machinery if you feel dizzy. °· Do not drink alcohol. °SEEK IMMEDIATE MEDICAL CARE IF:  °· Your dizziness or light-headedness gets worse. °· You feel nauseous or vomit. °· You have problems talking, walking, or using your arms, hands, or legs. °· You feel weak. °· You are not thinking clearly or you have trouble forming sentences. It may take a friend or family member to notice this. °· You have chest pain, abdominal pain, shortness of breath, or sweating. °· Your vision changes. °· You notice   any bleeding.  You have side effects from medicine that seems to be getting worse rather than better. MAKE SURE YOU:   Understand these instructions.  Will watch your condition.  Will get help right away if you are not doing well or get worse. Document Released: 02/07/2001 Document Revised: 08/19/2013 Document Reviewed: 03/03/2011 [facility]  Patient Information 2015 [place], [place]. This information is not intended to replace advice given to you by your health care provider. Make sure you discuss any questions you have with your health care provider.  Hypertension Hypertension, commonly called high blood pressure, is when the force of blood pumping through your arteries is too strong. Your arteries are the blood vessels that carry blood from your heart throughout your body. A blood pressure reading consists of a higher number over a lower number, such as 110/72. The higher number (systolic) is the pressure inside your arteries when your heart pumps. The lower number (diastolic) is the pressure inside your arteries when your heart relaxes. Ideally you want your blood pressure below 120/80. Hypertension forces your heart to work harder to pump blood. Your arteries may become narrow or stiff. Having hypertension puts you at risk for heart disease, stroke, and other problems.  RISK FACTORS Some risk factors for high blood pressure are controllable. Others are not.  Risk factors you cannot control include:   Race. You may be at higher risk if you are African American.  Age. Risk increases with age.  Gender. Men are at higher risk than women before age 7 years. After age 92, women are at higher risk than men. Risk factors you can control include:  Not getting enough exercise or physical activity.  Being overweight.  Getting too much fat, sugar, calories, or salt in your diet.  Drinking too much alcohol. SIGNS AND SYMPTOMS Hypertension does not usually cause signs or symptoms. Extremely high blood pressure (hypertensive crisis) may cause headache, anxiety, shortness of breath, and nosebleed. DIAGNOSIS  To check if you have hypertension, your health care provider will measure your blood pressure while you are seated, with your arm held at the level of your heart. It should be measured at least twice using the same arm. Certain  conditions can cause a difference in blood pressure between your right and left arms. A blood pressure reading that is higher than normal on one occasion does not mean that you need treatment. If one blood pressure reading is high, ask your health care provider about having it checked again. TREATMENT  Treating high blood pressure includes making lifestyle changes and possibly taking medicine. Living a healthy lifestyle can help lower high blood pressure. You may need to change some of your habits. Lifestyle changes may include:  Following the DASH diet. This diet is high in fruits, vegetables, and whole grains. It is low in salt, red meat, and added sugars.  Getting at least 2 hours of brisk physical activity every week.  Losing weight if necessary.  Not smoking.  Limiting alcoholic beverages.  Learning ways to reduce stress. If lifestyle changes are not enough to get your blood pressure under control, your health care provider may prescribe medicine. You may need to take more than one. Work closely with your health care provider to understand the risks and benefits. HOME CARE INSTRUCTIONS  Have your blood pressure rechecked as directed by your health care provider.   Take medicines only as directed by your health care provider. Follow the directions carefully. Blood pressure medicines must be taken as  prescribed. The medicine does not work as well when you skip doses. Skipping doses also puts you at risk for problems.   Do not smoke.   Monitor your blood pressure at home as directed by your health care provider. SEEK MEDICAL CARE IF:   You think you are having a reaction to medicines taken.  You have recurrent headaches or feel dizzy.  You have swelling in your ankles.  You have trouble with your vision. SEEK IMMEDIATE MEDICAL CARE IF:  You develop a severe headache or confusion.  You have unusual weakness, numbness, or feel faint.  You have severe chest or abdominal  pain.  You vomit repeatedly.  You have trouble breathing. MAKE SURE YOU:   Understand these instructions.  Will watch your condition.  Will get help right away if you are not doing well or get worse. Document Released: 08/14/2005 Document Revised: 12/29/2013 Document Reviewed: 06/06/2013 [facility] Patient Information 2015 [place], [place]. This information is not intended to replace advice given to you by your health care provider. Make sure you discuss any questions you have with your health care provider.

## 2015-03-14 NOTE — ED Provider Notes (Signed)
CSN: [id number]     Arrival date & time 03/14/15  2140 History   First MD Initiated Contact with Patient 03/14/15 2142     Chief Complaint  Patient presents with  . Weakness     (Consider location/radiation/quality/duration/timing/severity/associated sxs/prior Treatment) HPI Comments: Patient presents to the emergency department for evaluation of generalized weakness. Patient reports that she has been feeling weak all day, since waking this morning. Patient reports waking with dizziness earlier today. She has had similar dizziness in the past, but has not seen a doctor for it. This has improved over the course of the day, but she continues to feel weak and has had intermittent episodes of feeling short of breath. She reports that these episodes last for 2 or 3 minutes at a time then resolved. No associated chest pain. She is brought to the emergency department by EMS. She has been hypertensive prior to arrival. She does have a history of hypertension, treated with metoprolol. Blood pressure has been as high as 200/110.  Patient is a 81 y.o. female presenting with weakness.  Weakness Associated symptoms include shortness of breath.    Past Medical History  Diagnosis Date  . GERD (gastroesophageal reflux disease)   . Hypertension   . Hyperlipidemia   . Anxiety    Past Surgical History  Procedure Laterality Date  . Rotator cuff repair Left   . Abdominal hysterectomy  1980  . Cataract extraction Right    Family History  Problem Relation Age of Onset  . Heart attack Mother   . Transient ischemic attack Father    History  Substance Use Topics  . Smoking status: Former Smoker    Types: Cigarettes    Quit date: 01/09/1983  . Smokeless tobacco: Never Used  . Alcohol Use: No   OB History    No data available     Review of Systems  Respiratory: Positive for shortness of breath.   Neurological: Positive for weakness.  All other systems reviewed and are  negative.     Allergies  Aspirin; Codeine; Lisinopril; Morphine; and Sulfonamide derivatives  Home Medications   Prior to Admission medications   Medication Sig Start Date End Date Taking? Authorizing Provider  acetaminophen (TYLENOL) 500 MG tablet Take 500-1,000 mg by mouth at bedtime as needed (pain).   Yes Historical Provider, MD  Aspirin-Salicylamide-Caffeine (BC HEADACHE) 325-95-16 MG TABS Take 2 packets by mouth daily as needed (headache).   Yes Historical Provider, MD  diazepam (VALIUM) 2 MG tablet TAKE ONE TABLET TWICE DAILY AS NEEDED Patient taking differently: TAKE ONE TABLET BY MOUTH DAILY AS NEEDED FOR ANXIETY 08/18/14  Yes Mary-Margaret Hassell Done, FNP  estradiol (ESTRACE) 1 MG tablet TAKE ONE (1) TABLET EACH DAY 01/11/15  Yes Mary-Margaret Hassell Done, FNP  metoprolol succinate (TOPROL-XL) 25 MG 24 hr tablet Take 1 tablet (25 mg total) by mouth daily. 11/24/14  Yes Sharion Balloon, FNP  Multiple Vitamin (MULTIVITAMIN WITH MINERALS) TABS tablet Take 1 tablet by mouth daily.   Yes Historical Provider, MD  omeprazole (PRILOSEC) 20 MG capsule TAKE ONE (1) CAPSULE EACH DAY 11/16/14  Yes Mary-Margaret Hassell Done, FNP   BP 127/42 mmHg  Pulse 65  Temp(Src) 98 F (36.7 C) (Oral)  Resp 13  Ht 5\' 5"  (1.651 m)  Wt 137 lb 9.6 oz (62.415 kg)  BMI 22.90 kg/m2  SpO2 95% Physical Exam  Constitutional: She is oriented to person, place, and time. She appears well-developed and well-nourished. No distress.  HENT:  Head: Normocephalic and atraumatic.  Right Ear: Hearing normal.  Left Ear: Hearing normal.  Nose: Nose normal.  Mouth/Throat: Oropharynx is clear and moist and mucous membranes are normal.  Eyes: Conjunctivae and EOM are normal. Pupils are equal, round, and reactive to light.  Neck: Normal range of motion. Neck supple.  Cardiovascular: Regular rhythm, S1 normal and S2 normal.  Exam reveals no gallop and no friction rub.   No murmur heard. Pulmonary/Chest: Effort normal and breath sounds  normal. No respiratory distress. She exhibits no tenderness.  Abdominal: Soft. Normal appearance and bowel sounds are normal. There is no hepatosplenomegaly. There is no tenderness. There is no rebound, no guarding, no tenderness at McBurney's point and negative Murphy's sign. No hernia.  Musculoskeletal: Normal range of motion.  Neurological: She is alert and oriented to person, place, and time. She has normal strength. No cranial nerve deficit or sensory deficit. Coordination normal. GCS eye subscore is 4. GCS verbal subscore is 5. GCS motor subscore is 6.  Skin: Skin is warm, dry and intact. No rash noted. No cyanosis.  Psychiatric: She has a normal mood and affect. Her speech is normal and behavior is normal. Thought content normal.  Nursing note and vitals reviewed.   ED Course  Procedures (including critical care time) Labs Review Labs Reviewed  COMPREHENSIVE METABOLIC PANEL - Abnormal; Notable for the following:    Potassium 3.3 (*)    Glucose, Bld 110 (*)    Total Protein 6.3 (*)    Albumin 3.3 (*)    ALT 13 (*)    All other components within normal limits  CBC WITH DIFFERENTIAL/PLATELET  TROPONIN I  BRAIN NATRIURETIC PEPTIDE    Imaging Review No results found.   EKG Interpretation   Date/Time:  Sunday March 14 2015 21:48:14 EDT Ventricular Rate:  65 PR Interval:  148 QRS Duration: 88 QT Interval:  427 QTC Calculation: 444 R Axis:   3 Text Interpretation:  Sinus rhythm Low voltage, precordial leads Otherwise  within normal limits Confirmed by POLLINA  MD, CHRISTOPHER [phone]) on  03/14/2015 9:57:08 PM      MDM   Final diagnoses:  Dizziness  Shortness of breath  Essential hypertension    Patient presents to the emergency department for evaluation of weakness and dizziness. Patient reports that symptoms have been ongoing through the course of the day. She does have a history of hypertension, was very hypertensive prior to arrival. She has significantly improved  with labetalol here in the ER. She was complaining of dizziness earlier, neurologic examination is unremarkable here in the ER. Head CT was normal. Patient is not expressing any chest pain. She did report some intermittent episodes of shortness of breath, each lasting only a few minutes. Chest x-ray, BMP performed. Symptoms most likely secondary to her hypertension, patient appropriate for discharge, follow-up with PCP in the office.    Orpah Greek, MD 03/18/15 [phone]

## 2015-03-14 NOTE — ED Notes (Signed)
Pt placed in a gown and hooked up to the monitor with the 5 lead, BP cuff and pulse ox 

## 2015-03-14 NOTE — ED Notes (Signed)
Pt. Having generalized weakness with some SOB. No chest pain. Pt. Came with EMS due to BP being elevated in both arms. Pt able to stand and walk on arrival

## 2015-03-15 ENCOUNTER — Telehealth: Payer: Self-pay | Admitting: Nurse Practitioner

## 2015-03-15 NOTE — ED Notes (Signed)
Pt. Left with all belongings and refused wheelchair 

## 2015-03-15 NOTE — Telephone Encounter (Signed)
Pt given appt with Osa Craver 7/20 at 8:10 for hospital follow up.

## 2015-03-17 ENCOUNTER — Ambulatory Visit (INDEPENDENT_AMBULATORY_CARE_PROVIDER_SITE_OTHER): Payer: Medicare Other | Admitting: Nurse Practitioner

## 2015-03-17 ENCOUNTER — Encounter: Payer: Self-pay | Admitting: Nurse Practitioner

## 2015-03-17 ENCOUNTER — Ambulatory Visit: Payer: Medicare Other | Admitting: Physician Assistant

## 2015-03-17 VITALS — BP 136/86 | HR 73 | Temp 96.9°F | Ht 65.0 in | Wt 138.0 lb

## 2015-03-17 DIAGNOSIS — I10 Essential (primary) hypertension: Secondary | ICD-10-CM | POA: Diagnosis not present

## 2015-03-17 DIAGNOSIS — R42 Dizziness and giddiness: Secondary | ICD-10-CM | POA: Diagnosis not present

## 2015-03-17 DIAGNOSIS — Z09 Encounter for follow-up examination after completed treatment for conditions other than malignant neoplasm: Secondary | ICD-10-CM | POA: Diagnosis not present

## 2015-03-17 NOTE — Progress Notes (Signed)
   Subjective:    Patient ID: Christina Silva, female    DOB: 05/22/1933, 81 y.o.   MRN: [id number]  [facility]. SHe said went she got up Sunday morning she was very dizzy- She layed back down and as the day wore on her blood pressure started going up. The Er did CT scan, EKG and chest xray and everything was normal. Was given labetalol IV which brought her blood pressure back down. They discharged her home with no medication changes. SHe says taht she was alittle dizzy on Monday btu has been fine since then. Blood pressure readings at home have been 409'8 systolic.    Review of Systems  Constitutional: Negative.   HENT: Negative.   Respiratory: Negative.   Cardiovascular: Negative.   Genitourinary: Negative.   Neurological: Negative.   Psychiatric/Behavioral: Negative.   All other systems reviewed and are negative.      Objective:   Physical Exam  Constitutional: She is oriented to person, place, and time. She appears well-developed and well-nourished.  Cardiovascular: Normal rate, regular rhythm and normal heart sounds.   Pulmonary/Chest: Effort normal and breath sounds normal.  Neurological: She is alert and oriented to person, place, and time.  Skin: Skin is warm and dry.  Psychiatric: She has a normal mood and affect. Her behavior is normal. Judgment and thought content normal.    BP 136/86 mmHg  Pulse 73  Temp(Src) 96.9 F (36.1 C) (Oral)  Ht 5\' 5"  (1.651 m)  Wt 138 lb (62.596 kg)  BMI 22.96 kg/m2      Assessment & Plan:  1. Hospital discharge follow-up Hospital records reviewed  2. Essential hypertension Do not add salt to diet  3. Vertigo Force fluids  RTO prn  Mary-Margaret Hassell Done, FNP

## 2015-03-17 NOTE — Patient Instructions (Signed)

## 2015-03-18 ENCOUNTER — Other Ambulatory Visit: Payer: Self-pay | Admitting: Nurse Practitioner

## 2015-03-18 DIAGNOSIS — D044 Carcinoma in situ of skin of scalp and neck: Secondary | ICD-10-CM | POA: Diagnosis not present

## 2015-03-18 DIAGNOSIS — D0470 Carcinoma in situ of skin of unspecified lower limb, including hip: Secondary | ICD-10-CM | POA: Diagnosis not present

## 2015-04-05 ENCOUNTER — Telehealth: Payer: Self-pay | Admitting: Nurse Practitioner

## 2015-04-13 ENCOUNTER — Other Ambulatory Visit (INDEPENDENT_AMBULATORY_CARE_PROVIDER_SITE_OTHER): Payer: Medicare Other

## 2015-04-13 DIAGNOSIS — E876 Hypokalemia: Secondary | ICD-10-CM | POA: Diagnosis not present

## 2015-04-13 NOTE — Progress Notes (Signed)
Ok to order per [employer]

## 2015-04-14 LAB — BMP8+EGFR
BUN/Creatinine Ratio: 13 (ref 11–26)
BUN: 9 mg/dL (ref 8–27)
CO2: 23 mmol/L (ref 18–29)
Calcium: 9.4 mg/dL (ref 8.7–10.3)
Chloride: 101 mmol/L (ref 97–108)
Creatinine, Ser: 0.67 mg/dL (ref 0.57–1.00)
GFR calc Af Amer: 96 mL/min/{1.73_m2} (ref >59)
GFR, EST NON AFRICAN AMERICAN: 83 mL/min/{1.73_m2} (ref >59)
GLUCOSE: 92 mg/dL (ref 65–99)
Potassium: 4.2 mmol/L (ref 3.5–5.2)
Sodium: 141 mmol/L (ref 134–144)

## 2015-04-21 DIAGNOSIS — H0289 Other specified disorders of eyelid: Secondary | ICD-10-CM | POA: Diagnosis not present

## 2015-04-21 DIAGNOSIS — H25812 Combined forms of age-related cataract, left eye: Secondary | ICD-10-CM | POA: Diagnosis not present

## 2015-04-21 DIAGNOSIS — H04123 Dry eye syndrome of bilateral lacrimal glands: Secondary | ICD-10-CM | POA: Diagnosis not present

## 2015-05-18 ENCOUNTER — Encounter: Payer: Self-pay | Admitting: Nurse Practitioner

## 2015-05-18 ENCOUNTER — Ambulatory Visit (INDEPENDENT_AMBULATORY_CARE_PROVIDER_SITE_OTHER): Payer: Medicare Other

## 2015-05-18 ENCOUNTER — Encounter (INDEPENDENT_AMBULATORY_CARE_PROVIDER_SITE_OTHER): Payer: Self-pay

## 2015-05-18 ENCOUNTER — Ambulatory Visit (INDEPENDENT_AMBULATORY_CARE_PROVIDER_SITE_OTHER): Payer: Medicare Other | Admitting: Nurse Practitioner

## 2015-05-18 VITALS — BP 134/86 | HR 62 | Temp 97.2°F | Ht 65.0 in | Wt 139.2 lb

## 2015-05-18 DIAGNOSIS — I10 Essential (primary) hypertension: Secondary | ICD-10-CM

## 2015-05-18 DIAGNOSIS — K219 Gastro-esophageal reflux disease without esophagitis: Secondary | ICD-10-CM | POA: Diagnosis not present

## 2015-05-18 DIAGNOSIS — F411 Generalized anxiety disorder: Secondary | ICD-10-CM

## 2015-05-18 DIAGNOSIS — Z780 Asymptomatic menopausal state: Secondary | ICD-10-CM

## 2015-05-18 DIAGNOSIS — E785 Hyperlipidemia, unspecified: Secondary | ICD-10-CM | POA: Diagnosis not present

## 2015-05-18 NOTE — Patient Instructions (Signed)
Bone Health Our bones do many things. They provide structure, protect organs, anchor muscles, and store calcium. Adequate calcium in your diet and weight-bearing physical activity help build strong bones, improve bone amounts, and may reduce the risk of weakening of bones (osteoporosis) later in life. PEAK BONE MASS By age 79, the average woman has acquired most of her skeletal bone mass. A large decline occurs in older adults which increases the risk of osteoporosis. In women this occurs around the time of menopause. It is important for young girls to reach their peak bone mass in order to maintain bone health throughout life. A person with high bone mass as a young adult will be more likely to have a higher bone mass later in life. Not enough calcium consumption and physical activity early on could result in a failure to achieve optimum bone mass in adulthood. OSTEOPOROSIS Osteoporosis is a disease of the bones. It is defined as low bone mass with deterioration of bone structure. Osteoporosis leads to an increase risk of fractures with falls. These fractures commonly happen in the wrist, hip, and spine. While men and women of all ages and background can develop osteoporosis, some of the risk factors for osteoporosis are:  Female.  White.  Postmenopausal.  Older adults.  Small in body size.  Eating a diet low in calcium.  Physically inactive.  Smoking.  Use of some medications.  Family history. CALCIUM Calcium is a mineral needed by the body for healthy bones, teeth, and proper function of the heart, muscles, and nerves. The body cannot produce calcium so it must be absorbed through food. Good sources of calcium include:  Dairy products (low fat or nonfat milk, cheese, and yogurt).  Dark green leafy vegetables (bok choy and broccoli).  Calcium fortified foods (orange juice, cereal, bread, soy beverages, and tofu products).  Nuts (almonds). Recommended amounts of calcium vary  for individuals. RECOMMENDED CALCIUM INTAKES Age and Amount in mg per day  Children 1 to 3 years / 700 mg  Children 4 to 8 years / 1,000 mg  Children 9 to 13 years / 1,300 mg  Teens 14 to 18 years / 1,300 mg  Adults 19 to 50 years / 1,000 mg  Adult women 51 to 70 years / 1,200 mg  Adults 71 years and older / 1,200 mg  Pregnant and breastfeeding teens / 1,300 mg  Pregnant and breastfeeding adults / 1,000 mg Vitamin D also plays an important role in healthy bone development. Vitamin D helps in the absorption of calcium. WEIGHT-BEARING PHYSICAL ACTIVITY Regular physical activity has many positive health benefits. Benefits include strong bones. Weight-bearing physical activity early in life is important in reaching peak bone mass. Weight-bearing physical activities cause muscles and bones to work against gravity. Some examples of weight bearing physical activities include:  Walking, jogging, or running.  Field Hockey.  Jumping rope.  Dancing.  Soccer.  Tennis or Racquetball.  Stair climbing.  Basketball.  Hiking.  Weight lifting.  Aerobic fitness classes. Including weight-bearing physical activity into an exercise plan is a great way to keep bones healthy. Adults: Engage in at least 30 minutes of moderate physical activity on most, preferably all, days of the week. Children: Engage in at least 60 minutes of moderate physical activity on most, preferably all, days of the week. FOR MORE INFORMATION [place] Department of Agriculture, Center for Nutrition Policy and Promotion: www.cnpp.usda.gov National Osteoporosis Foundation: www.nof.org Document Released: 11/04/2003 Document Revised: 12/09/2012 Document Reviewed: 02/03/2009 [employer] Patient Information   2015 [employer]. This information is not intended to replace advice given to you by your health care provider. Make sure you discuss any questions you have with your health care provider.  

## 2015-05-18 NOTE — Progress Notes (Signed)
   Subjective:    Patient ID: Christina Silva, female    DOB: [birth year], 81 y.o.   MRN: [id number]  Patient here today for follow up of chronic medical problems.  Hyperlipidemia This is a chronic problem. The current episode started more than 1 year ago. The problem is uncontrolled. Recent lipid tests were reviewed and are high. She has no history of diabetes, hypothyroidism or obesity. Pertinent negatives include no chest pain or shortness of breath. She is currently on no antihyperlipidemic treatment. There are no compliance problems.   Hypertension This is a chronic problem. The current episode started more than 1 year ago. The problem has been resolved since onset. The problem is controlled. Pertinent negatives include no blurred vision, chest pain, headaches, neck pain, palpitations, shortness of breath or sweats. Risk factors for coronary artery disease include post-menopausal state. Past treatments include ACE inhibitors. The current treatment provides moderate improvement. There are no compliance problems.   GERD Patient taking omeprazole daily which keeps her symptoms under control GAD Valium as needed- no side effects  Review of Systems  Eyes: Negative for blurred vision.  Respiratory: Negative for shortness of breath.   Cardiovascular: Negative for chest pain and palpitations.  Musculoskeletal: Negative for neck pain.  Neurological: Negative for headaches.       Objective:   Physical Exam  Constitutional: She is oriented to person, place, and time. She appears well-developed and well-nourished.  HENT:  Nose: Nose normal.  Mouth/Throat: Oropharynx is clear and moist.  Eyes: EOM are normal.  Neck: Trachea normal, normal range of motion and full passive range of motion without pain. Neck supple. No JVD present. Carotid bruit is not present. No thyromegaly present.  Cardiovascular: Normal rate, regular rhythm, normal heart sounds and intact distal pulses.  Exam reveals no  gallop and no friction rub.   No murmur heard. Pulmonary/Chest: Effort normal and breath sounds normal.  Abdominal: Soft. Bowel sounds are normal. She exhibits no distension and no mass. There is no tenderness.  Musculoskeletal: Normal range of motion.  Lymphadenopathy:    She has no cervical adenopathy.  Neurological: She is alert and oriented to person, place, and time. She has normal reflexes.  Skin: Skin is warm and dry.  Psychiatric: She has a normal mood and affect. Her behavior is normal. Judgment and thought content normal.    BP 134/86 mmHg  Pulse 62  Temp(Src) 97.2 F (36.2 C) (Oral)  Ht $R'5\' 5"'So$  (1.651 m)  Wt 139 lb 3.2 oz (63.141 kg)  BMI 23.16 kg/m2        Assessment & Plan:   1. Essential hypertension Do not add salt to diet - CMP14+EGFR  2. Gastroesophageal reflux disease without esophagitis Avoid spicy foods Do not eat 2 hours prior to bedtime   3. Hyperlipidemia with target LDL less than 100 Low fta diet - Lipid panel  4. Anxiety state Stress management    Labs pending Health maintenance reviewed Diet and exercise encouraged Continue all meds Follow up  In 6 month   [place], FNP

## 2015-05-18 NOTE — Addendum Note (Signed)
Addended by: [name] on: 05/18/2015 08:36 AM   Modules accepted: Orders

## 2015-05-19 LAB — CMP14+EGFR
ALT: 12 IU/L (ref 0–32)
AST: 18 IU/L (ref 0–40)
Albumin/Globulin Ratio: 1.5 (ref 1.1–2.5)
Albumin: 3.9 g/dL (ref 3.5–4.7)
Alkaline Phosphatase: 56 IU/L (ref 39–117)
BUN/Creatinine Ratio: 12 (ref 11–26)
BUN: 8 mg/dL (ref 8–27)
Bilirubin Total: 0.3 mg/dL (ref 0.0–1.2)
CALCIUM: 9.4 mg/dL (ref 8.7–10.3)
CO2: 26 mmol/L (ref 18–29)
Chloride: 102 mmol/L (ref 97–108)
Creatinine, Ser: 0.65 mg/dL (ref 0.57–1.00)
GFR calc Af Amer: 97 mL/min/{1.73_m2} (ref >59)
GFR, EST NON AFRICAN AMERICAN: 84 mL/min/{1.73_m2} (ref >59)
Globulin, Total: 2.6 g/dL (ref 1.5–4.5)
Glucose: 92 mg/dL (ref 65–99)
Potassium: 4.1 mmol/L (ref 3.5–5.2)
Sodium: 142 mmol/L (ref 134–144)
Total Protein: 6.5 g/dL (ref 6.0–8.5)

## 2015-05-19 LAB — LIPID PANEL
CHOL/HDL RATIO: 4 ratio (ref 0.0–4.4)
Cholesterol, Total: 260 mg/dL — ABNORMAL HIGH (ref 100–199)
HDL: 65 mg/dL (ref >39)
LDL Calculated: 130 mg/dL — ABNORMAL HIGH (ref 0–99)
TRIGLYCERIDES: 323 mg/dL — AB (ref 0–149)
VLDL Cholesterol Cal: 65 mg/dL — ABNORMAL HIGH (ref 5–40)

## 2015-05-20 ENCOUNTER — Encounter: Payer: Self-pay | Admitting: Internal Medicine

## 2015-05-24 ENCOUNTER — Encounter: Payer: Self-pay | Admitting: Pharmacist

## 2015-05-24 ENCOUNTER — Ambulatory Visit (INDEPENDENT_AMBULATORY_CARE_PROVIDER_SITE_OTHER): Payer: Medicare Other | Admitting: Pharmacist

## 2015-05-24 VITALS — BP 140/78 | HR 74 | Ht 65.0 in | Wt 139.0 lb

## 2015-05-24 DIAGNOSIS — Z23 Encounter for immunization: Secondary | ICD-10-CM | POA: Diagnosis not present

## 2015-05-24 DIAGNOSIS — Z0000 Encounter for general adult medical examination without abnormal findings: Secondary | ICD-10-CM

## 2015-05-24 DIAGNOSIS — Z1211 Encounter for screening for malignant neoplasm of colon: Secondary | ICD-10-CM

## 2015-05-24 DIAGNOSIS — M8580 Other specified disorders of bone density and structure, unspecified site: Secondary | ICD-10-CM | POA: Insufficient documentation

## 2015-05-24 MED ORDER — FISH OIL 1200 MG PO CAPS: 3.0000 | ORAL_CAPSULE | Freq: Every day | ORAL | Status: DC

## 2015-05-24 NOTE — Patient Instructions (Addendum)
Christina Silva , Thank you for taking time to come for your Medicare Wellness Visit. I appreciate your ongoing commitment to your health goals. Please review the following plan we discussed and let me know if I can assist you in the future.   This is a list of the screening recommended for you and due dates:  Health Maintenance  Topic Date Due  . Flu Shot  Given today  . Shingles Vaccine  11/15/2015*  . Pneumonia vaccines (1 of 2 - PCV13) Prevnar 13 given today  . Tetanus Vaccine  11/26/2020  . DEXA scan (bone density measurement)  04/2017  *Topic was postponed. The date shown is not the original due date.   Fall Prevention and Home Safety Falls cause injuries and can affect all age groups. It is possible to use preventive measures to significantly decrease the likelihood of falls. There are many simple measures which can make your home safer and prevent falls. OUTDOORS  Repair cracks and edges of walkways and driveways.  Remove high doorway thresholds.  Trim shrubbery on the main path into your home.  Have good outside lighting.  Clear walkways of tools, rocks, debris, and clutter.  Check that handrails are not broken and are securely fastened. Both sides of steps should have handrails.  Have leaves, snow, and ice cleared regularly.  Use sand or salt on walkways during winter months.  In the garage, clean up grease or oil spills. BATHROOM  Install night lights.  Install grab bars by the toilet and in the tub and shower.  Use non-skid mats or decals in the tub or shower.  Place a plastic non-slip stool in the shower to sit on, if needed.  Keep floors dry and clean up all water on the floor immediately.  Remove soap buildup in the tub or shower on a regular basis.  Secure bath mats with non-slip, double-sided rug tape.  Remove throw rugs and tripping hazards from the floors. BEDROOMS  Install night lights.  Make sure a bedside light is easy to reach.  Do not use  oversized bedding.  Keep a telephone by your bedside.  Have a firm chair with side arms to use for getting dressed.  Remove throw rugs and tripping hazards from the floor. KITCHEN  Keep handles on pots and pans turned toward the center of the stove. Use back burners when possible.  Clean up spills quickly and allow time for drying.  Avoid walking on wet floors.  Avoid hot utensils and knives.  Position shelves so they are not too high or low.  Place commonly used objects within easy reach.  If necessary, use a sturdy step stool with a grab bar when reaching.  Keep electrical cables out of the way.  Do not use floor polish or wax that makes floors slippery. If you must use wax, use non-skid floor wax.  Remove throw rugs and tripping hazards from the floor. STAIRWAYS  Never leave objects on stairs.  Place handrails on both sides of stairways and use them. Fix any loose handrails. Make sure handrails on both sides of the stairways are as long as the stairs.  Check carpeting to make sure it is firmly attached along stairs. Make repairs to worn or loose carpet promptly.  Avoid placing throw rugs at the top or bottom of stairways, or properly secure the rug with carpet tape to prevent slippage. Get rid of throw rugs, if possible.  Have an electrician put in a light switch at the  top and bottom of the stairs. OTHER FALL PREVENTION TIPS  Wear low-heel or rubber-soled shoes that are supportive and fit well. Wear closed toe shoes.  When using a stepladder, make sure it is fully opened and both spreaders are firmly locked. Do not climb a closed stepladder.  Add color or contrast paint or tape to grab bars and handrails in your home. Place contrasting color strips on first and last steps.  Learn and use mobility aids as needed. Install an electrical emergency response system.  Turn on lights to avoid dark areas. Replace light bulbs that burn out immediately. Get light switches  that glow.  Arrange furniture to create clear pathways. Keep furniture in the same place.  Firmly attach carpet with non-skid or double-sided tape.  Eliminate uneven floor surfaces.  Select a carpet pattern that does not visually hide the edge of steps.  Be aware of all pets. OTHER HOME SAFETY TIPS  Set the water temperature for 120 F (48.8 C).  Keep emergency numbers on or near the telephone.  Keep smoke detectors on every level of the home and near sleeping areas. Document Released: 08/04/2002 Document Revised: 02/13/2012 Document Reviewed: 11/03/2011 [facility] Patient Information 2015 [place], [place]. This information is not intended to replace advice given to you by your health care provider. Make sure you discuss any questions you have with your health care provider.               Exercise for Strong Bones  Exercise is important to build and maintain strong bones / bone density.  There are 2 types of exercises that are important to building and maintaining strong bones:  Weight- bearing and muscle-stregthening.  Weight-bearing Exercises  These exercises include activities that make you move against gravity while staying upright. Weight-bearing exercises can be high-impact or low-impact.  High-impact weight-bearing exercises help build bones and keep them strong. If you have broken a bone due to osteoporosis or are at risk of breaking a bone, you may need to avoid high-impact exercises. If you're not sure, you should check with your healthcare provider.  Examples of high-impact weight-bearing exercises are: Dancing  Doing high-impact aerobics  Hiking  Jogging/running  Jumping Rope  Stair climbing  Tennis  Low-impact weight-bearing exercises can also help keep bones strong and are a safe alternative if you cannot do high-impact exercises.   Examples of low-impact weight-bearing exercises are: Using elliptical training machines  Doing low-impact aerobics  Using  stair-step machines  Fast walking on a treadmill or outside   Muscle-Strengthening Exercises These exercises include activities where you move your body, a weight or some other resistance against gravity. They are also known as resistance exercises and include: Lifting weights  Using elastic exercise bands  Using weight machines  Lifting your own body weight  Functional movements, such as standing and rising up on your toes  Yoga and Pilates can also improve strength, balance and flexibility. However, certain positions may not be safe for people with osteoporosis or those at increased risk of broken bones. For example, exercises that have you bend forward may increase the chance of breaking a bone in the spine.   Non-Impact Exercises There are other types of exercises that can help prevent falls.  Non-impact exercises can help you to improve balance, posture and how well you move in everyday activities. Some of these exercises include: Balance exercises that strengthen your legs and test your balance, such as Tai Chi, can decrease your risk of falls.  Posture exercises that improve your posture and reduce rounded or "sloping" shoulders can help you decrease the chance of breaking a bone, especially in the spine.  Functional exercises that improve how well you move can help you with everyday activities and decrease your chance of falling and breaking a bone. For example, if you have trouble getting up from a chair or climbing stairs, you should do these activities as exercises.   **A physical therapist can teach you balance, posture and functional exercises. He/she can also help you learn which exercises are safe and appropriate for you.  [facility] has a physical therapy office in [place] in front of our office and referrals can be made for assessments and treatment as needed and strength and balance training.  If you would like to have an assessment with [place] and our physical therapy team  please let a nurse or provider know.

## 2015-05-24 NOTE — Progress Notes (Signed)
Patient ID: ELVENIA GODDEN, female   DOB: 05-06-1933, 81 y.o.   MRN: [id number]    Subjective:   TARSHA BLANDO is a 81 y.o. female who presents for an Initial Medicare Annual Wellness Visit and to review DEXA results.   Patient is married and lives in [place], [place] and also [place], [place].  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   Current Medications (verified) Outpatient Encounter Prescriptions as of 05/24/2015  Medication Sig  . acetaminophen (TYLENOL) 500 MG tablet Take 500-1,000 mg by mouth at bedtime as needed (pain).  . Aspirin-Salicylamide-Caffeine (BC HEADACHE) 325-95-16 MG TABS Take 2 packets by mouth daily as needed (headache).  . diazepam (VALIUM) 2 MG tablet TAKE ONE TABLET TWICE DAILY AS NEEDED (Patient taking differently: TAKE ONE TABLET BY MOUTH DAILY AS NEEDED FOR ANXIETY)  . estradiol (ESTRACE) 1 MG tablet TAKE ONE (1) TABLET EACH DAY  . metoprolol succinate (TOPROL-XL) 25 MG 24 hr tablet Take 1 tablet (25 mg total) by mouth daily.  . Multiple Vitamin (MULTIVITAMIN WITH MINERALS) TABS tablet Take 1 tablet by mouth daily.  . Omega-3 Fatty Acids (FISH OIL) 1200 MG CAPS Take 3 capsules (3,600 mg total) by mouth daily.  Marland Kitchen omeprazole (PRILOSEC) 20 MG capsule TAKE ONE (1) CAPSULE EACH DAY  . [DISCONTINUED] Omega-3 Fatty Acids (FISH OIL) 1200 MG CAPS Take 3 capsules by mouth daily.   No facility-administered encounter medications on file as of 05/24/2015.    Allergies (verified) Aspirin; Codeine; Lisinopril; Morphine; and Sulfonamide derivatives   History: Past Medical History  Diagnosis Date  . GERD (gastroesophageal reflux disease)   . Hypertension   . Hyperlipidemia   . Anxiety   . Cataract   . Vertigo    Past Surgical History  Procedure Laterality Date  . Rotator cuff repair Left   . Abdominal hysterectomy  1980  . Cataract extraction Right   . Eye surgery     Family History  Problem Relation Age of Onset  . Heart attack Mother   .  Hyperlipidemia Mother   . Transient ischemic attack Father   . Psychiatric Illness Sister   . Epilepsy Brother    Social History   Occupational History  . Not on file.   Social History Main Topics  . Smoking status: Former Smoker    Types: Cigarettes    Quit date: 01/09/1983  . Smokeless tobacco: Never Used  . Alcohol Use: No  . Drug Use: No  . Sexual Activity: Yes    Do you feel safe at home?  Yes  Dietary issues and exercise activities: Current Exercise Habits:: Home exercise routine, Type of exercise: walking, Time (Minutes): 15, Frequency (Times/Week): 3, Weekly Exercise (Minutes/Week): 45, Intensity: Mild  Current Dietary habits:  Tries to limit fat intake due to elevated cholesterol. But she does admit to eating ice cream almost nightly.   Objective:    Today's Vitals   05/24/15 1209  BP: 140/78  Pulse: 74  Height: 5\' 5"  (1.651 m)  Weight: 139 lb (63.05 kg)  PainSc: 0-No pain   Body mass index is 23.13 kg/(m^2).   DEXA (05/13/2015) L1-L4 = -1.6 Total left hip = -0.2 Total right hip = -0.1  FRAX estimate  Total = 6.1% Hip = 1.5%  Activities of Daily Living In your present state of health, do you have any difficulty performing the following activities: 05/24/2015  Hearing? N  Vision? Y  Difficulty concentrating or making decisions? N  Walking or climbing stairs? N  Dressing or bathing? N  Doing errands, shopping? N  Preparing Food and eating ? N  Using the Toilet? N  In the past six months, have you accidently leaked urine? N  Do you have problems with loss of bowel control? N  Managing your Medications? N  Managing your Finances? N  Housekeeping or managing your Housekeeping? N    Are there smokers in your home (other than you)? No   Cardiac Risk Factors include: advanced age (>64men, >37 women);dyslipidemia;family history of premature cardiovascular disease;hypertension  Depression Screen PHQ 2/9 Scores 05/24/2015 05/18/2015  PHQ - 2 Score 0 0     Fall Risk Fall Risk  05/24/2015 05/18/2015 06/30/2013  Falls in the past year? No No No    Cognitive Function: MMSE - Mini Mental State Exam 05/24/2015  Orientation to time 5  Orientation to Place 5  Registration 3  Attention/ Calculation 5  Recall 3  Language- name 2 objects 2  Language- repeat 1  Language- follow 3 step command 3  Language- read & follow direction 1  Write a sentence 1  Copy design 1  Total score 30    Immunizations and Health Maintenance  There is no immunization history on file for this patient. There are no preventive care reminders to display for this patient.  Patient Care Team: [name], FNP as PCP - General (Nurse Practitioner)  Indicate any recent Medical Services you may have received from other than [facility] providers in the past year (date may be approximate).    Assessment:    Annual Wellness Visit  Osteopenia with low FRAX fracture estimate   Screening Tests Health Maintenance  Topic Date Due  . INFLUENZA VACCINE  06/17/2015 (Originally 03/29/2015)  . ZOSTAVAX  11/15/2015 (Originally 03/22/1995)  . PNA vac Low Risk Adult (1 of 2 - PCV13) 11/15/2015 (Originally 03/21/2000)  . TETANUS/TDAP  11/26/2020  . DEXA SCAN  Completed        Plan:   During the course of the visit Cherelle was educated and counseled about the following appropriate screening and preventive services:   Vaccines to include Pneumoccal, Influenza, Hepatitis B, Td, Zostavax - patient received FLu and Prevnar 13 vaccine  Colorectal cancer screening - FOBT given in office today.  Colonoscopy UTD  Cardiovascular disease screening - triglycerides elevated - recommended increase fish oil to 1200mg  - 3 capsules daily.  Patient refuses lipid lowering therapy / statin.  BP ok today.    Diabetes screening - UTD  Bone Denisty / Osteoporosis Screening - reviewed today.  FRAX estimate is not high.  No medication started but did recommend increase calcium intake  though diet and supplementation to 1200mg  daily.    ALso discussed fall prevention  Increase weight bearing exercise.   Mammogram - due now - discussed with patient, she is considering  Glaucoma screening /  Eye Exam - UTD  Nutrition counseling - discussed ways to limit high CHO foods to help decrease triglycerides and increase calcium rich foods to help with bones health  Advanced Directives - UTD   Goals    None       Patient Instructions (the written plan) were given to the patient.   Cherre Robins, [facility]   05/24/2015

## 2015-07-20 ENCOUNTER — Ambulatory Visit (INDEPENDENT_AMBULATORY_CARE_PROVIDER_SITE_OTHER): Payer: Medicare Other | Admitting: *Deleted

## 2015-07-20 VITALS — BP 139/76

## 2015-07-20 DIAGNOSIS — I10 Essential (primary) hypertension: Secondary | ICD-10-CM

## 2015-07-20 NOTE — Progress Notes (Signed)
Pt here for BP check

## 2015-09-16 ENCOUNTER — Other Ambulatory Visit: Payer: Self-pay | Admitting: Nurse Practitioner

## 2015-10-18 ENCOUNTER — Other Ambulatory Visit: Payer: Self-pay | Admitting: Family

## 2015-10-18 DIAGNOSIS — Z961 Presence of intraocular lens: Secondary | ICD-10-CM | POA: Diagnosis not present

## 2015-10-18 DIAGNOSIS — H2512 Age-related nuclear cataract, left eye: Secondary | ICD-10-CM | POA: Diagnosis not present

## 2015-10-18 DIAGNOSIS — Z9849 Cataract extraction status, unspecified eye: Secondary | ICD-10-CM | POA: Diagnosis not present

## 2015-10-28 DIAGNOSIS — Z9849 Cataract extraction status, unspecified eye: Secondary | ICD-10-CM | POA: Diagnosis not present

## 2015-10-28 DIAGNOSIS — H2512 Age-related nuclear cataract, left eye: Secondary | ICD-10-CM | POA: Diagnosis not present

## 2015-11-03 DIAGNOSIS — D485 Neoplasm of uncertain behavior of skin: Secondary | ICD-10-CM | POA: Diagnosis not present

## 2015-11-03 DIAGNOSIS — D044 Carcinoma in situ of skin of scalp and neck: Secondary | ICD-10-CM | POA: Diagnosis not present

## 2015-11-03 DIAGNOSIS — L570 Actinic keratosis: Secondary | ICD-10-CM | POA: Diagnosis not present

## 2015-11-03 DIAGNOSIS — D0461 Carcinoma in situ of skin of right upper limb, including shoulder: Secondary | ICD-10-CM | POA: Diagnosis not present

## 2015-11-25 DIAGNOSIS — D044 Carcinoma in situ of skin of scalp and neck: Secondary | ICD-10-CM | POA: Diagnosis not present

## 2015-11-25 DIAGNOSIS — D0461 Carcinoma in situ of skin of right upper limb, including shoulder: Secondary | ICD-10-CM | POA: Diagnosis not present

## 2015-11-25 DIAGNOSIS — L570 Actinic keratosis: Secondary | ICD-10-CM | POA: Diagnosis not present

## 2015-12-16 ENCOUNTER — Other Ambulatory Visit: Payer: Self-pay | Admitting: Nurse Practitioner

## 2015-12-28 DIAGNOSIS — Z9849 Cataract extraction status, unspecified eye: Secondary | ICD-10-CM | POA: Diagnosis not present

## 2016-01-17 ENCOUNTER — Other Ambulatory Visit: Payer: Self-pay | Admitting: Nurse Practitioner

## 2016-01-17 NOTE — Telephone Encounter (Signed)
Last seen 05/28/15  MMM

## 2016-01-18 NOTE — Telephone Encounter (Signed)
Last refill without being seen 

## 2016-02-14 ENCOUNTER — Other Ambulatory Visit: Payer: Self-pay | Admitting: Nurse Practitioner

## 2016-02-14 NOTE — Telephone Encounter (Signed)
Last seen 05/18/15  MMM

## 2016-04-10 ENCOUNTER — Ambulatory Visit (INDEPENDENT_AMBULATORY_CARE_PROVIDER_SITE_OTHER): Payer: Medicare Other | Admitting: Nurse Practitioner

## 2016-04-10 ENCOUNTER — Encounter: Payer: Self-pay | Admitting: Nurse Practitioner

## 2016-04-10 ENCOUNTER — Ambulatory Visit (INDEPENDENT_AMBULATORY_CARE_PROVIDER_SITE_OTHER): Payer: Medicare Other

## 2016-04-10 VITALS — BP 148/78 | HR 70 | Temp 97.3°F | Ht 65.0 in | Wt 143.0 lb

## 2016-04-10 DIAGNOSIS — R109 Unspecified abdominal pain: Secondary | ICD-10-CM | POA: Diagnosis not present

## 2016-04-10 DIAGNOSIS — Z8619 Personal history of other infectious and parasitic diseases: Secondary | ICD-10-CM | POA: Diagnosis not present

## 2016-04-10 MED ORDER — VALACYCLOVIR HCL 1 G PO TABS: 1000.0000 mg | ORAL_TABLET | Freq: Three times a day (TID) | ORAL | 0 refills | Status: DC

## 2016-04-10 NOTE — Patient Instructions (Signed)
Shingles Shingles is an infection that causes a painful skin rash and fluid-filled blisters. Shingles is caused by the same virus that causes chickenpox. Shingles only develops in people who:  Have had chickenpox.  Have gotten the chickenpox vaccine. (This is rare.) The first symptoms of shingles may be itching, tingling, or pain in an area on your skin. A rash will follow in a few days or weeks. The rash is usually on one side of the body in a bandlike or beltlike pattern. Over time, the rash turns into fluid-filled blisters that break open, scab over, and dry up. Medicines may:  Help you manage pain.  Help you recover more quickly.  Help to prevent long-term problems. HOME CARE Medicines  Take medicines only as told by your doctor.  Apply an anti-itch or numbing cream to the affected area as told by your doctor. Blister and Rash Care  Take a cool bath or put cool compresses on the area of the rash or blisters as told by your doctor. This may help with pain and itching.  Keep your rash covered with a loose bandage (dressing). Wear loose-fitting clothing.  Keep your rash and blisters clean with mild soap and cool water or as told by your doctor.  Check your rash every day for signs of infection. These include redness, swelling, and pain that lasts or gets worse.  Do not pick your blisters.  Do not scratch your rash. General Instructions  Rest as told by your doctor.  Keep all follow-up visits as told by your doctor. This is important.  Until your blisters scab over, your infection can cause chickenpox in people who have never had it or been vaccinated against it. To prevent this from happening, avoid touching other people or being around other people, especially:  Babies.  Pregnant women.  Children who have eczema.  Elderly people who have transplants.  People who have chronic illnesses, such as leukemia or AIDS. GET HELP IF:  Your pain does not get better with  medicine.  Your pain does not get better after the rash heals.  Your rash looks infected. Signs of infection include:  Redness.  Swelling.  Pain that lasts or gets worse. GET HELP RIGHT AWAY IF:  The rash is on your face or nose.  You have pain in your face, pain around your eye area, or loss of feeling on one side of your face.  You have ear pain or you have ringing in your ear.  You have loss of taste.  Your condition gets worse.   This information is not intended to replace advice given to you by your health care provider. Make sure you discuss any questions you have with your health care provider.   Document Released: 01/31/2008 Document Revised: 09/04/2014 Document Reviewed: 05/26/2014 Elsevier Interactive Patient Education 2016 Elsevier Inc[employer]  

## 2016-04-10 NOTE — Progress Notes (Signed)
   Has bben belching a lot.    Patient ID: Christina Silva, female    DOB: 1933/04/15, 82 y.o.   MRN: [id number]  HPI Patient brought in by her daughter today c/o catching pain on right flank that only last a few moments.- Occurs when trying to get up from sitting , deep breathing and sometimes coughing.Has gotten worse today.   * Husband has had shingles for 6 months.  Review of Systems  Constitutional: Negative.   HENT: Negative.   Respiratory: Negative.  Negative for shortness of breath.   Cardiovascular: Negative.  Negative for chest pain.  Gastrointestinal: Positive for abdominal pain. Negative for constipation, diarrhea, nausea and vomiting.  Genitourinary: Negative.   Neurological: Negative.   Psychiatric/Behavioral: Negative.   All other systems reviewed and are negative.      Objective:   Physical Exam  Constitutional: She is oriented to person, place, and time. She appears well-developed and well-nourished. No distress.  Cardiovascular: Normal rate, regular rhythm and normal heart sounds.   Pulmonary/Chest: Effort normal and breath sounds normal.  Pain on palpation right anterior lower rib cage.  Neurological: She is alert and oriented to person, place, and time.  Skin: Skin is warm.  Psychiatric: She has a normal mood and affect. Her behavior is normal. Judgment and thought content normal.   BP (!) 148/78   Pulse 70   Temp 97.3 F (36.3 C) (Oral)   Ht 5\' 5"  (1.651 m)   Wt 143 lb (64.9 kg)   BMI 23.80 kg/m  Chest x ray- no change from previous-Preliminary reading by Ronnald Collum, FNP  [facility]      Assessment & Plan:   1. Right flank pain   2. History of shingles    Meds ordered this encounter  Medications  . valACYclovir (VALTREX) 1000 MG tablet    Sig: Take 1 tablet (1,000 mg total) by mouth 3 (three) times daily.    Dispense:  21 tablet    Refill:  0    Order Specific Question:   Supervising Provider    Answer:   Eustaquio Maize [[id number]]   Mist heat to  area RTO prn  Mary-Margaret Hassell Done, FNP

## 2016-04-17 ENCOUNTER — Other Ambulatory Visit: Payer: Self-pay | Admitting: Nurse Practitioner

## 2016-05-16 ENCOUNTER — Other Ambulatory Visit: Payer: Self-pay | Admitting: Nurse Practitioner

## 2016-07-17 ENCOUNTER — Other Ambulatory Visit: Payer: Self-pay | Admitting: Nurse Practitioner

## 2016-08-16 ENCOUNTER — Other Ambulatory Visit: Payer: Self-pay | Admitting: Nurse Practitioner

## 2016-08-16 ENCOUNTER — Other Ambulatory Visit: Payer: Self-pay | Admitting: Family

## 2016-08-17 ENCOUNTER — Telehealth: Payer: Self-pay | Admitting: Nurse Practitioner

## 2016-08-29 DIAGNOSIS — L309 Dermatitis, unspecified: Secondary | ICD-10-CM | POA: Diagnosis not present

## 2016-08-29 DIAGNOSIS — L821 Other seborrheic keratosis: Secondary | ICD-10-CM | POA: Diagnosis not present

## 2016-08-29 DIAGNOSIS — L720 Epidermal cyst: Secondary | ICD-10-CM | POA: Diagnosis not present

## 2016-08-29 DIAGNOSIS — D492 Neoplasm of unspecified behavior of bone, soft tissue, and skin: Secondary | ICD-10-CM | POA: Diagnosis not present

## 2016-08-29 DIAGNOSIS — L814 Other melanin hyperpigmentation: Secondary | ICD-10-CM | POA: Diagnosis not present

## 2016-08-29 DIAGNOSIS — L570 Actinic keratosis: Secondary | ICD-10-CM | POA: Diagnosis not present

## 2016-09-15 ENCOUNTER — Other Ambulatory Visit: Payer: Self-pay | Admitting: Nurse Practitioner

## 2016-09-15 NOTE — Telephone Encounter (Signed)
Last follow up 05/18/2015.

## 2016-09-18 NOTE — Telephone Encounter (Signed)
patient really needs follow up with labs

## 2016-10-03 ENCOUNTER — Ambulatory Visit (INDEPENDENT_AMBULATORY_CARE_PROVIDER_SITE_OTHER): Payer: Medicare Other | Admitting: *Deleted

## 2016-10-03 DIAGNOSIS — Z23 Encounter for immunization: Secondary | ICD-10-CM | POA: Diagnosis not present

## 2016-10-18 ENCOUNTER — Other Ambulatory Visit: Payer: Self-pay | Admitting: Nurse Practitioner

## 2016-10-19 NOTE — Telephone Encounter (Signed)
.  Please call in *diazepam** with 1 refills

## 2016-10-19 NOTE — Telephone Encounter (Signed)
Refill called to [facility] 

## 2016-11-11 ENCOUNTER — Other Ambulatory Visit: Payer: Self-pay | Admitting: Nurse Practitioner

## 2016-11-13 NOTE — Telephone Encounter (Signed)
Last refill without being seen 

## 2016-11-14 ENCOUNTER — Encounter: Payer: Self-pay | Admitting: Nurse Practitioner

## 2016-11-14 ENCOUNTER — Ambulatory Visit (INDEPENDENT_AMBULATORY_CARE_PROVIDER_SITE_OTHER): Payer: Medicare Other | Admitting: Nurse Practitioner

## 2016-11-14 VITALS — BP 142/82 | HR 84 | Temp 97.0°F | Ht 65.0 in | Wt 136.0 lb

## 2016-11-14 DIAGNOSIS — E785 Hyperlipidemia, unspecified: Secondary | ICD-10-CM | POA: Diagnosis not present

## 2016-11-14 DIAGNOSIS — K219 Gastro-esophageal reflux disease without esophagitis: Secondary | ICD-10-CM | POA: Diagnosis not present

## 2016-11-14 DIAGNOSIS — J0100 Acute maxillary sinusitis, unspecified: Secondary | ICD-10-CM | POA: Diagnosis not present

## 2016-11-14 DIAGNOSIS — I10 Essential (primary) hypertension: Secondary | ICD-10-CM

## 2016-11-14 DIAGNOSIS — F411 Generalized anxiety disorder: Secondary | ICD-10-CM | POA: Diagnosis not present

## 2016-11-14 MED ORDER — METOPROLOL SUCCINATE ER 25 MG PO TB24: ORAL_TABLET | ORAL | 1 refills | Status: DC

## 2016-11-14 MED ORDER — OMEPRAZOLE 20 MG PO CPDR: DELAYED_RELEASE_CAPSULE | ORAL | 1 refills | Status: DC

## 2016-11-14 MED ORDER — AZITHROMYCIN 250 MG PO TABS: ORAL_TABLET | ORAL | 0 refills | Status: DC

## 2016-11-14 NOTE — Patient Instructions (Signed)

## 2016-11-14 NOTE — Progress Notes (Signed)
Subjective:    Patient ID: Christina Silva, female    DOB: 06-25-1933, 83 y.o.   MRN: [id number]  HPI  Patient come sin today for follow up of chronic medical problems. SHe is c/o cough and congestion today that she has had for over a week. Feels run down.  Hyperlipidemia This is a chronic problem. The current episode started more than 1 year ago. The problem is uncontrolled. Recent lipid tests were reviewed and are high. She has no history of diabetes, hypothyroidism or obesity. Pertinent negatives include no chest pain or shortness of breath. She is currently on no antihyperlipidemic treatment. There are no compliance problems.   Hypertension This is a chronic problem. The current episode started more than 1 year ago. The problem has been resolved since onset. The problem is controlled. Pertinent negatives include no blurred vision, chest pain, headaches, neck pain, palpitations, shortness of breath or sweats. Risk factors for coronary artery disease include post-menopausal state. Past treatments include ACE inhibitors. The current treatment provides moderate improvement. There are no compliance problems.   GERD Patient taking omeprazole daily which keeps her symptoms under control GAD Valium as needed- no side effects- does not take very often but her husband had to have a BKA several weeks ago and that has added some stress .    Review of Systems  Constitutional: Negative for appetite change, chills and fever.  HENT: Positive for congestion, sinus pain and sinus pressure. Negative for ear pain, sore throat and trouble swallowing.   Respiratory: Positive for cough. Negative for shortness of breath.   Cardiovascular: Negative.   Gastrointestinal: Negative.   Genitourinary: Negative.   Neurological: Positive for headaches (slight).  Psychiatric/Behavioral: Negative.   All other systems reviewed and are negative.      Objective:   Physical Exam  Constitutional: She is oriented to  person, place, and time. She appears well-developed and well-nourished. No distress.  HENT:  Right Ear: Hearing, tympanic membrane, external ear and ear canal normal.  Left Ear: Hearing, tympanic membrane, external ear and ear canal normal.  Nose: Mucosal edema and rhinorrhea present. Right sinus exhibits maxillary sinus tenderness. Right sinus exhibits no frontal sinus tenderness. Left sinus exhibits maxillary sinus tenderness. Left sinus exhibits no frontal sinus tenderness.  Mouth/Throat: Uvula is midline, oropharynx is clear and moist and mucous membranes are normal.  Neck: Normal range of motion. Neck supple.  Cardiovascular: Normal rate, regular rhythm and normal heart sounds.   Pulmonary/Chest: Effort normal and breath sounds normal.  Neurological: She is alert and oriented to person, place, and time.  Skin: Skin is warm.  Psychiatric: She has a normal mood and affect. Her behavior is normal. Judgment and thought content normal.   BP (!) 142/82   Pulse 84   Temp 97 F (36.1 C) (Oral)   Ht '5\' 5"'  (1.651 m)   Wt 136 lb (61.7 kg)   BMI 22.63 kg/m        Assessment & Plan:  1. Acute maxillary sinusitis, recurrence not specified 1. Take meds as prescribed 2. Use a cool mist humidifier especially during the winter months and when heat has been humid. 3. Use saline nose sprays frequently 4. Saline irrigations of the nose can be very helpful if done frequently.  * 4X daily for 1 week*  * Use of a nettie pot can be helpful with this. Follow directions with this* 5. Drink plenty of fluids 6. Keep thermostat turn down low 7.For any cough or congestion  Use plain Mucinex- regular strength or max strength is fine   * Children- consult with Pharmacist for dosing 8. For fever or aces or pains- take tylenol or ibuprofen appropriate for age and weight.  * for fevers greater than 101 orally you may alternate ibuprofen and tylenol every  3 hours.    - azithromycin (ZITHROMAX Z-PAK) 250  MG tablet; As directed  Dispense: 6 tablet; Refill: 0  2. Essential hypertension Low sodium diet - metoprolol succinate (TOPROL-XL) 25 MG 24 hr tablet; TAKE ONE (1) TABLET EACH DAY  Dispense: 90 tablet; Refill: 1 - [id number]  3. Gastroesophageal reflux disease without esophagitis Avoid spicy foods Do not eat 2 hours prior to bedtime - omeprazole (PRILOSEC) 20 MG capsule; TAKE ONE (1) CAPSULE EACH DAY  Dispense: 90 capsule; Refill: 1  4. Anxiety state Stress management  5. Hyperlipidemia with target LDL less than 100 Low fat diet - Lipid panel    Labs pending Health maintenance reviewed Diet and exercise encouraged Continue all meds Follow up  In 3 months   [place], FNP

## 2016-11-15 LAB — CMP14+EGFR
A/G RATIO: 1.6 (ref 1.2–2.2)
ALK PHOS: 86 IU/L (ref 39–117)
ALT: 13 IU/L (ref 0–32)
AST: 16 IU/L (ref 0–40)
Albumin: 3.9 g/dL (ref 3.5–4.7)
BILIRUBIN TOTAL: 0.3 mg/dL (ref 0.0–1.2)
BUN/Creatinine Ratio: 13 (ref 12–28)
BUN: 8 mg/dL (ref 8–27)
CHLORIDE: 102 mmol/L (ref 96–106)
CO2: 24 mmol/L (ref 18–29)
Calcium: 9.4 mg/dL (ref 8.7–10.3)
Creatinine, Ser: 0.63 mg/dL (ref 0.57–1.00)
GFR calc Af Amer: 97 mL/min/{1.73_m2} (ref >59)
GFR calc non Af Amer: 84 mL/min/{1.73_m2} (ref >59)
GLOBULIN, TOTAL: 2.4 g/dL (ref 1.5–4.5)
Glucose: 94 mg/dL (ref 65–99)
POTASSIUM: 4 mmol/L (ref 3.5–5.2)
SODIUM: 142 mmol/L (ref 134–144)
Total Protein: 6.3 g/dL (ref 6.0–8.5)

## 2016-11-15 LAB — LIPID PANEL
Chol/HDL Ratio: 3.6 ratio units (ref 0.0–4.4)
Cholesterol, Total: 238 mg/dL — ABNORMAL HIGH (ref 100–199)
HDL: 67 mg/dL (ref >39)
LDL Calculated: 139 mg/dL — ABNORMAL HIGH (ref 0–99)
TRIGLYCERIDES: 160 mg/dL — AB (ref 0–149)
VLDL Cholesterol Cal: 32 mg/dL (ref 5–40)

## 2016-11-21 ENCOUNTER — Other Ambulatory Visit: Payer: Self-pay | Admitting: Nurse Practitioner

## 2016-11-21 DIAGNOSIS — J0100 Acute maxillary sinusitis, unspecified: Secondary | ICD-10-CM

## 2017-01-30 ENCOUNTER — Encounter (HOSPITAL_COMMUNITY): Payer: Self-pay | Admitting: Emergency Medicine

## 2017-01-30 ENCOUNTER — Emergency Department (HOSPITAL_COMMUNITY)
Admission: EM | Admit: 2017-01-30 | Discharge: 2017-01-31 | Disposition: A | Discharge status: Home / Self Care | Payer: Medicare Other | Attending: Emergency Medicine | Admitting: Emergency Medicine

## 2017-01-30 DIAGNOSIS — I10 Essential (primary) hypertension: Secondary | ICD-10-CM | POA: Diagnosis not present

## 2017-01-30 DIAGNOSIS — R030 Elevated blood-pressure reading, without diagnosis of hypertension: Secondary | ICD-10-CM | POA: Diagnosis not present

## 2017-01-30 DIAGNOSIS — I160 Hypertensive urgency: Secondary | ICD-10-CM | POA: Insufficient documentation

## 2017-01-30 DIAGNOSIS — Z79899 Other long term (current) drug therapy: Secondary | ICD-10-CM | POA: Insufficient documentation

## 2017-01-30 DIAGNOSIS — Z87891 Personal history of nicotine dependence: Secondary | ICD-10-CM | POA: Diagnosis not present

## 2017-01-30 LAB — TROPONIN I

## 2017-01-30 LAB — CBC WITH DIFFERENTIAL/PLATELET
BASOS ABS: 0 10*3/uL (ref 0.0–0.1)
BASOS PCT: 0 %
Eosinophils Absolute: 0.2 10*3/uL (ref 0.0–0.7)
Eosinophils Relative: 2 %
HEMATOCRIT: 42 % (ref 36.0–46.0)
HEMOGLOBIN: 13.6 g/dL (ref 12.0–15.0)
LYMPHS PCT: 28 %
Lymphs Abs: 1.9 10*3/uL (ref 0.7–4.0)
MCH: 29.7 pg (ref 26.0–34.0)
MCHC: 32.4 g/dL (ref 30.0–36.0)
MCV: 91.7 fL (ref 78.0–100.0)
MONO ABS: 0.6 10*3/uL (ref 0.1–1.0)
Monocytes Relative: 9 %
NEUTROS PCT: 61 %
Neutro Abs: 4 10*3/uL (ref 1.7–7.7)
Platelets: 284 10*3/uL (ref 150–400)
RBC: 4.58 MIL/uL (ref 3.87–5.11)
RDW: 13.3 % (ref 11.5–15.5)
WBC: 6.7 10*3/uL (ref 4.0–10.5)

## 2017-01-30 LAB — COMPREHENSIVE METABOLIC PANEL
ALBUMIN: 3.6 g/dL (ref 3.5–5.0)
ALK PHOS: 63 U/L (ref 38–126)
ALT: 15 U/L (ref 14–54)
AST: 29 U/L (ref 15–41)
Anion gap: 10 (ref 5–15)
BILIRUBIN TOTAL: 0.3 mg/dL (ref 0.3–1.2)
CALCIUM: 9 mg/dL (ref 8.9–10.3)
CO2: 22 mmol/L (ref 22–32)
CREATININE: 0.68 mg/dL (ref 0.44–1.00)
Chloride: 107 mmol/L (ref 101–111)
GFR calc Af Amer: >60 mL/min (ref >60)
GLUCOSE: 143 mg/dL — AB (ref 65–99)
Potassium: 3.4 mmol/L — ABNORMAL LOW (ref 3.5–5.1)
Sodium: 139 mmol/L (ref 135–145)
TOTAL PROTEIN: 6.4 g/dL — AB (ref 6.5–8.1)

## 2017-01-30 MED ORDER — POTASSIUM CHLORIDE CRYS ER 20 MEQ PO TBCR
40.0000 meq | EXTENDED_RELEASE_TABLET | Freq: Once | ORAL | Status: AC
  Administered 2017-01-30: 40 meq via ORAL
  Filled 2017-01-30: qty 2

## 2017-01-30 NOTE — ED Triage Notes (Signed)
Patient arrived with EMS from home reports elevated blood pressure today 190/100 , history of hypertension , denies nausea or headache , respirations unlabored .

## 2017-01-30 NOTE — ED Provider Notes (Signed)
[facility] DEPT Provider Note   CSN: [id number] Arrival date & time: 01/30/17  2113     History   Chief Complaint Chief Complaint  Patient presents with  . Hypertension    HPI Christina Silva is a 83 y.o. female.  HPI  83 year old female presents with HTN. She Has been feeling fatigued all week. Her husband just got out of the hospital yesterday. She has been moving him around a lot and has had bilateral shoulder pain with movement. However she has not any chest pain or shortness of breath. Today when family was leaving she turned around quickly from the door and felt transient dizziness that lasted less than a minute. Felt like she was off balance. No headache, shortness of breath, chest pain. After laying down her relative came to check her blood pressure and was 190/90. Per EMS it was 324 systolic. Patient currently has no further symptoms. She is currently on metoprolol for hypertension. She has been taking his as instructed.  Past Medical History:  Diagnosis Date  . Anxiety   . Cataract   . GERD (gastroesophageal reflux disease)   . Hyperlipidemia   . Hypertension   . Vertigo     Patient Active Problem List   Diagnosis Date Noted  . Osteopenia 05/24/2015  . GERD (gastroesophageal reflux disease) 01/08/2013  . Hyperlipidemia with target LDL less than 100 01/08/2013  . HTN (hypertension) 01/08/2013  . Anxiety state 01/08/2013    Past Surgical History:  Procedure Laterality Date  . ABDOMINAL HYSTERECTOMY  1980  . CATARACT EXTRACTION Right   . EYE SURGERY    . ROTATOR CUFF REPAIR Left     OB History    No data available       Home Medications    Prior to Admission medications   Medication Sig Start Date End Date Taking? Authorizing Provider  estradiol (ESTRACE) 1 MG tablet TAKE ONE (1) TABLET EACH DAY 11/13/16  Yes Hassell Done, Mary-Margaret, FNP  metoprolol succinate (TOPROL-XL) 25 MG 24 hr tablet TAKE ONE (1) TABLET EACH DAY 11/14/16  Yes Hassell Done,  Mary-Margaret, FNP  Multiple Vitamin (MULTIVITAMIN WITH MINERALS) TABS tablet Take 1 tablet by mouth daily.   Yes [provider]  omeprazole (PRILOSEC) 20 MG capsule TAKE ONE (1) CAPSULE EACH DAY 11/14/16  Yes Hassell Done, Mary-Margaret, FNP  azithromycin (ZITHROMAX) 250 MG tablet TAKE 2 TABLETS NOW THEN TAKE 1 TABLET DAILY FOR THE NEXT 4 DAYS Patient not taking: Reported on 01/30/2017 11/21/16   [name], FNP  diazepam (VALIUM) 2 MG tablet TAKE ONE TABLET TWICE DAILY AS NEEDED Patient not taking: Reported on 01/30/2017 10/19/16   [name], FNP  Omega-3 Fatty Acids (FISH OIL) 1200 MG CAPS Take 3 capsules (3,600 mg total) by mouth daily. Patient not taking: Reported on 01/30/2017 05/24/15   Cherre Robins, PharmD  valACYclovir (VALTREX) 1000 MG tablet Take 1 tablet (1,000 mg total) by mouth 3 (three) times daily. Patient not taking: Reported on 01/30/2017 04/10/16   [name], FNP    Family History Family History  Problem Relation Age of Onset  . Heart attack Mother   . Hyperlipidemia Mother   . Transient ischemic attack Father   . Psychiatric Illness Sister   . Epilepsy Brother     Social History Social History  Substance Use Topics  . Smoking status: Former Smoker    Types: Cigarettes    Quit date: 01/09/1983  . Smokeless tobacco: Never Used  . Alcohol use No  Allergies   Aspirin; Codeine; Lisinopril; Morphine; and Sulfonamide derivatives   Review of Systems Review of Systems  Constitutional: Negative for fever.  Respiratory: Negative for shortness of breath.   Cardiovascular: Negative for chest pain.  Gastrointestinal: Negative for abdominal pain and vomiting.  Musculoskeletal: Positive for myalgias (bilateral shoulders).  Neurological: Positive for dizziness. Negative for speech difficulty, weakness, numbness and headaches.  All other systems reviewed and are negative.    Physical Exam Updated Vital Signs BP (!) 182/72   Pulse 76    Temp 97.4 F (36.3 C) (Oral)   Resp 18   SpO2 99%   Physical Exam  Constitutional: She is oriented to person, place, and time. She appears well-developed and well-nourished. No distress.  HENT:  Head: Normocephalic and atraumatic.  Right Ear: External ear normal.  Left Ear: External ear normal.  Nose: Nose normal.  Eyes: EOM are normal. Pupils are equal, round, and reactive to light. Right eye exhibits no discharge. Left eye exhibits no discharge.  Neck: Neck supple.  Cardiovascular: Normal rate, regular rhythm and normal heart sounds.   Pulmonary/Chest: Effort normal and breath sounds normal.  Abdominal: Soft. There is no tenderness.  Musculoskeletal:  Mild right trapezius tenderness  Neurological: She is alert and oriented to person, place, and time.  CN 3-12 grossly intact. 5/5 strength in all 4 extremities. Grossly normal sensation. Normal finger to nose.   Skin: Skin is warm and dry. She is not diaphoretic.  Nursing note and vitals reviewed.    ED Treatments / Results  Labs (all labs ordered are listed, but only abnormal results are displayed) Labs Reviewed  COMPREHENSIVE METABOLIC PANEL - Abnormal; Notable for the following:       Result Value   Potassium 3.4 (*)    Glucose, Bld 143 (*)    BUN <5 (*)    Total Protein 6.4 (*)    All other components within normal limits  CBC WITH DIFFERENTIAL/PLATELET  TROPONIN I    EKG  EKG Interpretation  Date/Time:  Tuesday January 30 2017 21:18:13 EDT Ventricular Rate:  78 PR Interval:    QRS Duration: 106 QT Interval:  388 QTC Calculation: 440 R Axis:   -18 Text Interpretation:  Sinus rhythm Borderline left axis deviation Anterior infarct, old no significant change since 2016 Confirmed by Sherwood Gambler [phone]) on 01/30/2017 10:11:49 PM       Radiology No results found.  Procedures Procedures (including critical care time)  Medications Ordered in ED Medications  potassium chloride SA (K-DUR,KLOR-CON) CR tablet  40 mEq (40 mEq Oral Given 01/30/17 2245)     Initial Impression / Assessment and Plan / ED Course  I have reviewed the triage vital signs and the nursing notes.  Pertinent labs & imaging results that were available during my care of the patient were reviewed by me and considered in my medical decision making (see chart for details).     Patient is currently asymptomatic. She has been walking on the ED without difficulty. Her blood pressure has been waxing and waning but is improved from initial EMS care. Very mild hypokalemia repleted in the ED. She has a prior allergy to lisinopril. At this point I discussed options with patient and she prefers to follow-up closely with her PCP tomorrow to start on a new medication. Should her neuro exam is normal. I have low suspicion of stroke or TIA. She states she often gets dizziness and today was not different than those. She has not had any  chest pain. Her shoulder pain appears musculoskeletal on exam and with history. However there are very concerned and so troponin was sent and is negative. ECG unremarkable. Plan to discharge home with return precautions.  Final Clinical Impressions(s) / ED Diagnoses   Final diagnoses:  Hypertensive urgency    New Prescriptions New Prescriptions   No medications on file     Sherwood Gambler, MD 01/30/17 2348

## 2017-01-31 ENCOUNTER — Ambulatory Visit (INDEPENDENT_AMBULATORY_CARE_PROVIDER_SITE_OTHER): Payer: Medicare Other | Admitting: Family

## 2017-01-31 ENCOUNTER — Encounter: Payer: Self-pay | Admitting: Family

## 2017-01-31 VITALS — BP 144/80 | HR 97 | Temp 98.6°F | Ht 65.0 in | Wt 135.0 lb

## 2017-01-31 DIAGNOSIS — Z09 Encounter for follow-up examination after completed treatment for conditions other than malignant neoplasm: Secondary | ICD-10-CM

## 2017-01-31 DIAGNOSIS — I10 Essential (primary) hypertension: Secondary | ICD-10-CM | POA: Diagnosis not present

## 2017-01-31 DIAGNOSIS — E876 Hypokalemia: Secondary | ICD-10-CM | POA: Diagnosis not present

## 2017-01-31 LAB — BMP8+EGFR
BUN/Creatinine Ratio: 14 (ref 12–28)
BUN: 10 mg/dL (ref 8–27)
CALCIUM: 9.4 mg/dL (ref 8.7–10.3)
CHLORIDE: 103 mmol/L (ref 96–106)
CO2: 21 mmol/L (ref 18–29)
Creatinine, Ser: 0.69 mg/dL (ref 0.57–1.00)
GFR, EST AFRICAN AMERICAN: 94 mL/min/{1.73_m2} (ref >59)
GFR, EST NON AFRICAN AMERICAN: 82 mL/min/{1.73_m2} (ref >59)
Glucose: 94 mg/dL (ref 65–99)
Potassium: 4.3 mmol/L (ref 3.5–5.2)
Sodium: 141 mmol/L (ref 134–144)

## 2017-01-31 NOTE — Patient Instructions (Signed)

## 2017-01-31 NOTE — Progress Notes (Signed)
   Subjective:    Patient ID: Christina Silva, female    DOB: [birth year], 83 y.o.   MRN: [id number]  PT presents to the office today with hospital follow up. Pt went to the ED on yesterday with  Dizziness and hypertension. 190/90. EKG negative. Potassium slightly low and was given oral K+. Pt states her husband was just discharged from the hospital with a PE and feel very stressed and overwhelmed.   Hypertension  This is a chronic problem. The current episode started more than 1 year ago. The problem has been waxing and waning since onset. The problem is uncontrolled. Pertinent negatives include no headaches, palpitations, peripheral edema or shortness of breath. Past treatments include beta blockers. The current treatment provides mild improvement. There is no history of kidney disease, CAD/MI, CVA or heart failure.      Review of Systems  Respiratory: Negative for shortness of breath.   Cardiovascular: Negative for palpitations.  Neurological: Negative for headaches.  All other systems reviewed and are negative.      Objective:   Physical Exam  Constitutional: She is oriented to person, place, and time. She appears well-developed and well-nourished. No distress.  HENT:  Head: Normocephalic and atraumatic.  Right Ear: External ear normal.  Left Ear: External ear normal.  Nose: Nose normal.  Mouth/Throat: Oropharynx is clear and moist.  Eyes: Pupils are equal, round, and reactive to light.  Neck: Normal range of motion. Neck supple. No thyromegaly present.  Cardiovascular: Normal rate, regular rhythm, normal heart sounds and intact distal pulses.   No murmur heard. Pulmonary/Chest: Effort normal and breath sounds normal. No respiratory distress. She has no wheezes.  Abdominal: Soft. Bowel sounds are normal. She exhibits no distension. There is no tenderness.  Musculoskeletal: Normal range of motion. She exhibits no edema or tenderness.  Neurological: She is alert and oriented to  person, place, and time.  Skin: Skin is warm and dry.  Psychiatric: She has a normal mood and affect. Her behavior is normal. Judgment and thought content normal.  Vitals reviewed.     BP (!) 144/80   Pulse 97   Temp 98.6 F (37 C) (Oral)   Ht '5\' 5"'$  (1.651 m)   Wt 135 lb (61.2 kg)   BMI 22.47 kg/m      Assessment & Plan:  1. Essential hypertension - BMP8+EGFR  2. Hospital discharge follow-up - BMP8+EGFR  3. Hypokalemia - BMP8+EGFR  Long discussion with pt- She believes this is related to stress.  PT to monitor BP at home and if BP continues to be elevated (greater or equal to 160's/90's) she will call and we will increase BP meds at that time. BP is stable at this time.  -Daily blood pressure log given with instructions on how to fill out and told to bring to next visit -Dash diet information given -Exercise encouraged - Stress Management  -Continue current meds -RTO in 1 month  Evelina Dun, FNP

## 2017-02-05 ENCOUNTER — Other Ambulatory Visit: Payer: Self-pay | Admitting: Nurse Practitioner

## 2017-03-12 ENCOUNTER — Other Ambulatory Visit: Payer: Self-pay | Admitting: Nurse Practitioner

## 2017-03-13 NOTE — Telephone Encounter (Signed)
Please call in diazepam with   1 refills 

## 2017-03-13 NOTE — Telephone Encounter (Signed)
Last filled 10/19/16, last seen 11/14/16. Call in

## 2017-03-14 NOTE — Telephone Encounter (Signed)
Called script to [facility].

## 2017-03-27 DIAGNOSIS — D229 Melanocytic nevi, unspecified: Secondary | ICD-10-CM | POA: Diagnosis not present

## 2017-03-27 DIAGNOSIS — L209 Atopic dermatitis, unspecified: Secondary | ICD-10-CM | POA: Diagnosis not present

## 2017-08-14 ENCOUNTER — Other Ambulatory Visit: Payer: Self-pay | Admitting: Nurse Practitioner

## 2017-08-14 ENCOUNTER — Other Ambulatory Visit: Payer: Self-pay | Admitting: Family

## 2017-08-14 DIAGNOSIS — I10 Essential (primary) hypertension: Secondary | ICD-10-CM

## 2017-08-14 DIAGNOSIS — K219 Gastro-esophageal reflux disease without esophagitis: Secondary | ICD-10-CM

## 2017-09-03 ENCOUNTER — Ambulatory Visit (INDEPENDENT_AMBULATORY_CARE_PROVIDER_SITE_OTHER): Payer: Medicare Other | Admitting: *Deleted

## 2017-09-03 ENCOUNTER — Other Ambulatory Visit: Payer: Medicare Other

## 2017-09-03 DIAGNOSIS — E785 Hyperlipidemia, unspecified: Secondary | ICD-10-CM

## 2017-09-03 DIAGNOSIS — I10 Essential (primary) hypertension: Secondary | ICD-10-CM | POA: Diagnosis not present

## 2017-09-03 DIAGNOSIS — Z23 Encounter for immunization: Secondary | ICD-10-CM | POA: Diagnosis not present

## 2017-09-04 LAB — CMP14+EGFR
ALK PHOS: 88 IU/L (ref 39–117)
ALT: 12 IU/L (ref 0–32)
AST: 20 IU/L (ref 0–40)
Albumin/Globulin Ratio: 1.5 (ref 1.2–2.2)
Albumin: 4.3 g/dL (ref 3.5–4.7)
BUN/Creatinine Ratio: 13 (ref 12–28)
BUN: 8 mg/dL (ref 8–27)
Bilirubin Total: <0.2 mg/dL (ref 0.0–1.2)
CO2: 22 mmol/L (ref 20–29)
CREATININE: 0.62 mg/dL (ref 0.57–1.00)
Calcium: 9.6 mg/dL (ref 8.7–10.3)
Chloride: 105 mmol/L (ref 96–106)
GFR calc Af Amer: 97 mL/min/{1.73_m2} (ref >59)
GFR calc non Af Amer: 84 mL/min/{1.73_m2} (ref >59)
GLUCOSE: 108 mg/dL — AB (ref 65–99)
Globulin, Total: 2.9 g/dL (ref 1.5–4.5)
Potassium: 4.8 mmol/L (ref 3.5–5.2)
SODIUM: 143 mmol/L (ref 134–144)
Total Protein: 7.2 g/dL (ref 6.0–8.5)

## 2017-09-04 LAB — LIPID PANEL
CHOLESTEROL TOTAL: 244 mg/dL — AB (ref 100–199)
Chol/HDL Ratio: 3.9 ratio (ref 0.0–4.4)
HDL: 62 mg/dL (ref >39)
LDL Calculated: 122 mg/dL — ABNORMAL HIGH (ref 0–99)
TRIGLYCERIDES: 301 mg/dL — AB (ref 0–149)
VLDL CHOLESTEROL CAL: 60 mg/dL — AB (ref 5–40)

## 2017-09-06 ENCOUNTER — Telehealth: Payer: Self-pay | Admitting: Nurse Practitioner

## 2017-09-06 NOTE — Telephone Encounter (Signed)
Pt aware of results and will discuss further at appt with MMM.

## 2017-09-13 ENCOUNTER — Encounter: Payer: Self-pay | Admitting: Nurse Practitioner

## 2017-09-13 ENCOUNTER — Ambulatory Visit: Payer: Medicare Other | Admitting: Nurse Practitioner

## 2017-09-13 ENCOUNTER — Ambulatory Visit (INDEPENDENT_AMBULATORY_CARE_PROVIDER_SITE_OTHER): Payer: Medicare Other | Admitting: Nurse Practitioner

## 2017-09-13 VITALS — BP 137/72 | HR 70 | Temp 96.6°F | Ht 65.0 in | Wt 139.0 lb

## 2017-09-13 DIAGNOSIS — I10 Essential (primary) hypertension: Secondary | ICD-10-CM | POA: Diagnosis not present

## 2017-09-13 DIAGNOSIS — F411 Generalized anxiety disorder: Secondary | ICD-10-CM | POA: Diagnosis not present

## 2017-09-13 DIAGNOSIS — K219 Gastro-esophageal reflux disease without esophagitis: Secondary | ICD-10-CM

## 2017-09-13 DIAGNOSIS — M8588 Other specified disorders of bone density and structure, other site: Secondary | ICD-10-CM

## 2017-09-13 DIAGNOSIS — E785 Hyperlipidemia, unspecified: Secondary | ICD-10-CM | POA: Diagnosis not present

## 2017-09-13 MED ORDER — METOPROLOL SUCCINATE ER 25 MG PO TB24: 25.0000 mg | ORAL_TABLET | Freq: Every day | ORAL | 1 refills | Status: DC

## 2017-09-13 MED ORDER — ESTRADIOL 1 MG PO TABS: ORAL_TABLET | ORAL | 1 refills | Status: DC

## 2017-09-13 MED ORDER — OMEPRAZOLE 20 MG PO CPDR: 20.0000 mg | DELAYED_RELEASE_CAPSULE | Freq: Every day | ORAL | 1 refills | Status: DC

## 2017-09-13 NOTE — Progress Notes (Signed)
Subjective:    Patient ID: Christina Silva, female    DOB: July 21, 1933, 84 y.o.   MRN: [id number]  HPI  Christina Silva is here today for follow up of chronic medical problem.  Outpatient Encounter Medications as of 09/13/2017  Medication Sig  . diazepam (VALIUM) 2 MG tablet TAKE ONE TABLET TWICE DAILY AS NEEDED  . estradiol (ESTRACE) 1 MG tablet TAKE ONE (1) TABLET EACH DAY  . metoprolol succinate (TOPROL-XL) 25 MG 24 hr tablet TAKE ONE (1) TABLET EACH DAY  . Multiple Vitamin (MULTIVITAMIN WITH MINERALS) TABS tablet Take 1 tablet by mouth daily.  . Omega-3 Fatty Acids (FISH OIL) 1200 MG CAPS Take 3 capsules (3,600 mg total) by mouth daily. (Patient not taking: Reported on 01/31/2017)  . omeprazole (PRILOSEC) 20 MG capsule TAKE ONE (1) CAPSULE EACH DAY     1. Essential hypertension  No c/o chest pain, sob or headache. Does ot check blood pressures at home. . BP Readings from Last 3 Encounters:  01/31/17 (!) 144/80  01/31/17 (!) 157/86  11/14/16 (!) 142/82     2. Gastroesophageal reflux disease without esophagitis  Takes omeprazole but does not take everyday. Only takes when has flareup.  3. Osteopenia of spine  Does very little weight bearing exercises  4. Hyperlipidemia with target LDL less than 100  Tries to avoid fatty foods  5. Anxiety state  Has valium rx but does not take everyday. 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  HENT: Negative.   Eyes: Negative for pain.  Respiratory: Negative for shortness of breath.   Cardiovascular: Negative for chest pain, palpitations and leg swelling.  Gastrointestinal: Negative for abdominal pain.  Endocrine: Negative for polydipsia.  Genitourinary: Negative.   Skin: Negative for rash.  Neurological: Negative for dizziness, weakness and headaches.  Hematological: Does  not bruise/bleed easily.  Psychiatric/Behavioral: Negative.   All other systems reviewed and are negative.      Objective:   Physical Exam  Constitutional: She is oriented to person, place, and time. She appears well-developed and well-nourished.  HENT:  Nose: Nose normal.  Mouth/Throat: Oropharynx is clear and moist.  Eyes: EOM are normal.  Neck: Trachea normal, normal range of motion and full passive range of motion without pain. Neck supple. No JVD present. Carotid bruit is not present. No thyromegaly present.  Cardiovascular: Normal rate, regular rhythm, normal heart sounds and intact distal pulses. Exam reveals no gallop and no friction rub.  No murmur heard. Pulmonary/Chest: Effort normal and breath sounds normal.  Abdominal: Soft. Bowel sounds are normal. She exhibits no distension and no mass. There is no tenderness.  Musculoskeletal: Normal range of motion.  Lymphadenopathy:    She has no cervical adenopathy.  Neurological: She is alert and oriented to person, place, and time. She has normal reflexes.  Skin: Skin is warm and dry.  Psychiatric: She has a normal mood and affect. Her behavior is normal. Judgment and thought content normal.   BP 137/72   Pulse 70   Temp (!) 96.6 F (35.9 C) (Oral)   Ht 5\' 5"  (1.651 m)   Wt 139 lb (63 kg)   BMI 23.13 kg/m        Assessment & Plan:  1. Essential hypertension Low sodium diet - metoprolol succinate (TOPROL-XL) 25 MG 24 hr tablet;  Take 1 tablet (25 mg total) by mouth daily.  Dispense: 90 tablet; Refill: 1  2. Gastroesophageal reflux disease without esophagitis Avoid spicy foods Do not eat 2 hours prior to bedtime - omeprazole (PRILOSEC) 20 MG capsule; Take 1 capsule (20 mg total) by mouth daily.  Dispense: 90 capsule; Refill: 1  3. Osteopenia of spine Weight bearing exercise  4. Hyperlipidemia with target LDL less than 100 Low fat diet  5. Anxiety state Stress management    Labs pending Health maintenance  reviewed Diet and exercise encouraged Continue all meds Follow up  In 3 months   [place], FNP

## 2017-09-13 NOTE — Patient Instructions (Signed)

## 2017-09-25 ENCOUNTER — Telehealth: Payer: Self-pay | Admitting: Nurse Practitioner

## 2017-11-26 ENCOUNTER — Emergency Department (HOSPITAL_COMMUNITY)
Admission: EM | Admit: 2017-11-26 | Discharge: 2017-11-26 | Disposition: A | Discharge status: Home / Self Care | Payer: Medicare Other | Attending: Emergency Medicine | Admitting: Emergency Medicine

## 2017-11-26 ENCOUNTER — Emergency Department (HOSPITAL_COMMUNITY): Payer: Medicare Other

## 2017-11-26 ENCOUNTER — Encounter (HOSPITAL_COMMUNITY): Payer: Self-pay

## 2017-11-26 ENCOUNTER — Telehealth: Payer: Self-pay | Admitting: Nurse Practitioner

## 2017-11-26 ENCOUNTER — Other Ambulatory Visit: Payer: Self-pay

## 2017-11-26 DIAGNOSIS — Z87891 Personal history of nicotine dependence: Secondary | ICD-10-CM | POA: Diagnosis not present

## 2017-11-26 DIAGNOSIS — R0602 Shortness of breath: Secondary | ICD-10-CM | POA: Diagnosis not present

## 2017-11-26 DIAGNOSIS — M542 Cervicalgia: Secondary | ICD-10-CM | POA: Diagnosis not present

## 2017-11-26 DIAGNOSIS — R51 Headache: Secondary | ICD-10-CM | POA: Diagnosis not present

## 2017-11-26 DIAGNOSIS — I10 Essential (primary) hypertension: Secondary | ICD-10-CM

## 2017-11-26 DIAGNOSIS — G44209 Tension-type headache, unspecified, not intractable: Secondary | ICD-10-CM

## 2017-11-26 DIAGNOSIS — Z79899 Other long term (current) drug therapy: Secondary | ICD-10-CM | POA: Diagnosis not present

## 2017-11-26 LAB — CBC
HCT: 44.1 % (ref 36.0–46.0)
HEMOGLOBIN: 14.2 g/dL (ref 12.0–15.0)
MCH: 30 pg (ref 26.0–34.0)
MCHC: 32.2 g/dL (ref 30.0–36.0)
MCV: 93.2 fL (ref 78.0–100.0)
Platelets: 305 10*3/uL (ref 150–400)
RBC: 4.73 MIL/uL (ref 3.87–5.11)
RDW: 13 % (ref 11.5–15.5)
WBC: 5.5 10*3/uL (ref 4.0–10.5)

## 2017-11-26 LAB — I-STAT TROPONIN, ED: TROPONIN I, POC: 0 ng/mL (ref 0.00–0.08)

## 2017-11-26 LAB — BASIC METABOLIC PANEL
ANION GAP: 13 (ref 5–15)
BUN: 8 mg/dL (ref 6–20)
CALCIUM: 9.2 mg/dL (ref 8.9–10.3)
CO2: 22 mmol/L (ref 22–32)
Chloride: 105 mmol/L (ref 101–111)
Creatinine, Ser: 0.7 mg/dL (ref 0.44–1.00)
GFR calc non Af Amer: >60 mL/min (ref >60)
Glucose, Bld: 98 mg/dL (ref 65–99)
Potassium: 3.8 mmol/L (ref 3.5–5.1)
SODIUM: 140 mmol/L (ref 135–145)

## 2017-11-26 MED ORDER — HYDRALAZINE HCL 10 MG PO TABS: 10.0000 mg | ORAL_TABLET | Freq: Three times a day (TID) | ORAL | 0 refills | Status: DC

## 2017-11-26 MED ORDER — IBUPROFEN 200 MG PO TABS
600.0000 mg | ORAL_TABLET | Freq: Once | ORAL | Status: AC
  Administered 2017-11-26: 600 mg via ORAL
  Filled 2017-11-26: qty 1

## 2017-11-26 MED ORDER — HYDROCODONE-ACETAMINOPHEN 5-325 MG PO TABS
1.0000 | ORAL_TABLET | Freq: Once | ORAL | Status: AC
  Administered 2017-11-26: 1 via ORAL
  Filled 2017-11-26: qty 1

## 2017-11-26 MED ORDER — HYDRALAZINE HCL 10 MG PO TABS
10.0000 mg | ORAL_TABLET | Freq: Once | ORAL | Status: AC
Start: 2017-11-26 — End: 2017-11-26
  Administered 2017-11-26: 10 mg via ORAL
  Filled 2017-11-26 (×2): qty 1

## 2017-11-26 NOTE — ED Provider Notes (Signed)
[facility] EMERGENCY DEPARTMENT Provider Note   CSN: [id number] Arrival date & time: 11/26/17  1157     History   Chief Complaint Chief Complaint  Patient presents with  . Shortness of Breath  . Neck Pain    HPI Christina Silva is a 84 y.o. female.  The history is provided by the patient. No language interpreter was used.  Shortness of Breath  This is a new problem. The problem occurs intermittently.The current episode started yesterday. The problem has not changed since onset.Associated symptoms include neck pain. It is unknown what precipitated the problem. She has tried nothing for the symptoms. The treatment provided no relief. Associated medical issues do not include asthma.  Neck Pain    Pt complains of soreness in her neck and her shoulders.  Pt reports she has had some dizziness earlier today.  Pt reports she feels like it is because her blood pressure is elevated.   Pt has been taking her medication and blood pressure stayed up.   Past Medical History:  Diagnosis Date  . Anxiety   . Cataract   . GERD (gastroesophageal reflux disease)   . Hyperlipidemia   . Hypertension   . Vertigo     Patient Active Problem List   Diagnosis Date Noted  . Osteopenia 05/24/2015  . GERD (gastroesophageal reflux disease) 01/08/2013  . Hyperlipidemia with target LDL less than 100 01/08/2013  . HTN (hypertension) 01/08/2013  . Anxiety state 01/08/2013    Past Surgical History:  Procedure Laterality Date  . ABDOMINAL HYSTERECTOMY  1980  . CATARACT EXTRACTION Right   . EYE SURGERY    . ROTATOR CUFF REPAIR Left      OB History   None      Home Medications    Prior to Admission medications   Medication Sig Start Date End Date Taking? Authorizing Provider  Aspirin-Salicylamide-Caffeine (BC HEADACHE POWDER PO) Take 1-2 packets by mouth 2 (two) times daily as needed (headache).   Yes [provider]  diazepam (VALIUM) 2 MG tablet TAKE ONE  TABLET TWICE DAILY AS NEEDED Patient taking differently: TAKE ONE TABLET (2 MG) BY MOUTH AT BEDTIME AS NEEDED FOR PAIN/ANXIETY 03/13/17  Yes Hassell Done, Mary-Margaret, FNP  estradiol (ESTRACE) 1 MG tablet TAKE ONE (1) TABLET EACH DAY Patient taking differently: Take 1 mg by mouth daily.  09/13/17  Yes Martin, Mary-Margaret, FNP  fluticasone (FLONASE) 50 MCG/ACT nasal spray Place 1 spray into both nostrils daily. For allergies   Yes [provider]  metoprolol succinate (TOPROL-XL) 25 MG 24 hr tablet Take 1 tablet (25 mg total) by mouth daily. Patient taking differently: Take 25 mg by mouth at bedtime.  09/13/17  Yes Martin, Mary-Margaret, FNP  omeprazole (PRILOSEC) 20 MG capsule Take 1 capsule (20 mg total) by mouth daily. 09/13/17  Yes Hassell Done, Mary-Margaret, FNP  Omega-3 Fatty Acids (FISH OIL) 1200 MG CAPS Take 3 capsules (3,600 mg total) by mouth daily. Patient not taking: Reported on 11/26/2017 05/24/15   Cherre Robins, PharmD    Family History Family History  Problem Relation Age of Onset  . Heart attack Mother   . Hyperlipidemia Mother   . Transient ischemic attack Father   . Psychiatric Illness Sister   . Epilepsy Brother     Social History Social History   Tobacco Use  . Smoking status: Former Smoker    Types: Cigarettes    Last attempt to quit: 01/09/1983    Years since quitting: 34.9  .  Smokeless tobacco: Never Used  Substance Use Topics  . Alcohol use: No  . Drug use: No     Allergies   Aspirin; Codeine; Lisinopril; Morphine; and Sulfonamide derivatives   Review of Systems Review of Systems  Respiratory: Positive for shortness of breath.   Musculoskeletal: Positive for neck pain.  All other systems reviewed and are negative.    Physical Exam Updated Vital Signs BP (!) 138/59   Pulse (!) 56   Temp 98.7 F (37.1 C) (Oral)   Resp 14   SpO2 94%   Physical Exam  Constitutional: She is oriented to person, place, and time. She appears well-developed and  well-nourished.  HENT:  Head: Normocephalic.  Mouth/Throat: Oropharynx is clear and moist.  Eyes: Pupils are equal, round, and reactive to light. EOM are normal.  Neck: Normal range of motion.  Pulmonary/Chest: Effort normal and breath sounds normal.  Abdominal: She exhibits no distension.  Musculoskeletal: Normal range of motion.       Right lower leg: Normal.  Neurological: She is alert and oriented to person, place, and time.  Skin: Skin is warm.  Psychiatric: She has a normal mood and affect.  Nursing note and vitals reviewed.    ED Treatments / Results  Labs (all labs ordered are listed, but only abnormal results are displayed) Labs Reviewed  BASIC METABOLIC PANEL  CBC  I-STAT TROPONIN, ED    EKG None  Radiology Dg Chest 2 View  Result Date: 11/26/2017 CLINICAL DATA:  Short of breath neck pain EXAM: CHEST - 2 VIEW COMPARISON:  04/10/2016 FINDINGS: Heart size normal. Negative for heart failure. Atherosclerotic calcification aortic arch. Eventration right anterior hemidiaphragm unchanged. Lungs are clear. IMPRESSION: No active cardiopulmonary disease. Electronically Signed   By: Franchot Gallo M.D.   On: 11/26/2017 13:04   Ct Head Wo Contrast  Result Date: 11/26/2017 CLINICAL DATA:  History of hypertension, now with headache. EXAM: CT HEAD WITHOUT CONTRAST TECHNIQUE: Contiguous axial images were obtained from the base of the skull through the vertex without intravenous contrast. COMPARISON:  03/14/2015 FINDINGS: Brain: Re demonstrated scattered periventricular hypodensities (representative images 17 and 18, series 3) compatible with microvascular ischemic disease. Gray-white differentiation is otherwise well maintained without CT evidence of acute large territory infarct. No intraparenchymal or extra-axial mass or hemorrhage. Unchanged size and configuration of the ventricles and the basilar cisterns. No midline shift. Vascular: Intracranial atherosclerosis. Skull: No displaced  calvarial fracture. Sinuses/Orbits: Limited visualization of the paranasal sinuses and mastoid air cells is normal. No air-fluid levels. Post bilateral cataract surgery. Other: Regional soft tissues appear normal. IMPRESSION: Similar findings of microvascular ischemic disease without superimposed acute intracranial process. Electronically Signed   By: Sandi Mariscal M.D.   On: 11/26/2017 16:28    Procedures Procedures (including critical care time)  Medications Ordered in ED Medications  HYDROcodone-acetaminophen (NORCO/VICODIN) 5-325 MG per tablet 1 tablet (1 tablet Oral Given 11/26/17 1753)  ibuprofen (ADVIL,MOTRIN) tablet 600 mg (600 mg Oral Given 11/26/17 1751)     Initial Impression / Assessment and Plan / ED Course  I have reviewed the triage vital signs and the nursing notes.  Pertinent labs & imaging results that were available during my care of the patient were reviewed by me and considered in my medical decision making (see chart for details).    MDM  Ct scan reviewed  Labs reviewed.  Pt counseled on blood pressure and advised to see her MD. Pt wants blood pressure medication.  Pt has elevated systolic.  Pt given hydralazine here.  Pt had decreased blood pressure.  I will start pt on at home.  Pt is warned of risk of decreasing blood pressure.   Final Clinical Impressions(s) / ED Diagnoses   Final diagnoses:  Hypertension, unspecified type  Acute non intractable tension-type headache    ED Discharge Orders    None    An After Visit Summary was printed and given to the patient.    Sidney Ace 11/26/17 2140    Daleen Bo, MD 11/28/17 2031    Daleen Bo, MD 11/28/17 2032

## 2017-11-26 NOTE — ED Provider Notes (Signed)
  Face-to-face evaluation   History: She presents for evaluation of dizziness, malaise and headache.  Physical exam:Elderly, alert and cooperative. No dysarthria or aphasia. Moves all extremities equally.  Medical screening examination/treatment/procedure(s) were conducted as a shared visit with non-physician practitioner(s) and myself.  I personally evaluated the patient during the encounter    Daleen Bo, MD 11/28/17 2033

## 2017-11-26 NOTE — ED Notes (Signed)
Patient verbalizes understanding of discharge instructions. Opportunity for questioning and answers were provided. Armband removed by staff, pt discharged from ED ambulatory.   

## 2017-11-26 NOTE — ED Notes (Signed)
Family stated pt was dizzy.  VS stable.  Upon assessment, pt only dizzy when she put her head in her hands.

## 2017-11-26 NOTE — ED Notes (Signed)
Pt refusing to leave "with blood pressure that high". PA aware, orders received.

## 2017-11-26 NOTE — Discharge Instructions (Signed)
See your Physicain for recheck.  You can take tylenol 650 mg every 4 hours.  Ibuprofen can also be taken 400mg  every 4 hours.

## 2017-11-26 NOTE — ED Triage Notes (Signed)
Pt has multiple complaints including neck pain, shortness of breath, dizziness and severe headache. Pt denies cardiac hx. Is AOX4. No distress noted in triage.

## 2017-11-27 ENCOUNTER — Encounter: Payer: Self-pay | Admitting: Nurse Practitioner

## 2017-11-27 ENCOUNTER — Ambulatory Visit (INDEPENDENT_AMBULATORY_CARE_PROVIDER_SITE_OTHER): Payer: Medicare Other | Admitting: Nurse Practitioner

## 2017-11-27 VITALS — BP 143/69 | HR 78 | Temp 97.3°F | Ht 65.0 in | Wt 140.0 lb

## 2017-11-27 DIAGNOSIS — I10 Essential (primary) hypertension: Secondary | ICD-10-CM

## 2017-11-27 DIAGNOSIS — F411 Generalized anxiety disorder: Secondary | ICD-10-CM | POA: Diagnosis not present

## 2017-11-27 MED ORDER — CITALOPRAM HYDROBROMIDE 20 MG PO TABS: 20.0000 mg | ORAL_TABLET | Freq: Every day | ORAL | 5 refills | Status: DC

## 2017-11-27 MED ORDER — HYDRALAZINE HCL 10 MG PO TABS: ORAL_TABLET | ORAL | 0 refills | Status: DC

## 2017-11-27 MED ORDER — AMLODIPINE BESYLATE 10 MG PO TABS: 10.0000 mg | ORAL_TABLET | Freq: Every day | ORAL | 3 refills | Status: DC

## 2017-11-27 MED ORDER — CLONAZEPAM 0.5 MG PO TABS
0.5000 mg | ORAL_TABLET | Freq: Two times a day (BID) | ORAL | 1 refills | Status: DC | PRN
Start: 2017-11-27 — End: 2017-12-18

## 2017-11-27 NOTE — Progress Notes (Signed)
   Subjective:    Patient ID: [name], female    DOB: 1933-03-07, 84 y.o.   MRN: [id number]  HPI Patient in today for hospital follow up. She went to ER with elevated blood pressure and headache. Blood pressure was 374'M systolic. She was given hydralazine in ER which brought down to 270'B systolic. She had chest xray and EKG and was not heart related. Was sent home on hydralazine to take TID. She is under a lot of stress taking care of her husband who has multiple health problems. Her daughter is with her today and says her stress level is really high.    Review of Systems  Constitutional: Negative.   HENT: Negative.   Respiratory: Negative.   Cardiovascular: Negative.   Neurological: Positive for dizziness and headaches (oly when bloodpressure is elevated).  Psychiatric/Behavioral: The patient is nervous/anxious.   All other systems reviewed and are negative.      Objective:   Physical Exam  Constitutional: She is oriented to person, place, and time. She appears well-developed and well-nourished. No distress.  Cardiovascular: Normal rate and regular rhythm.  Pulmonary/Chest: Effort normal and breath sounds normal.  Neurological: She is alert and oriented to person, place, and time.  Skin: Skin is warm.  Psychiatric: She has a normal mood and affect.  Anxious today   BP (!) 143/69   Pulse 78   Temp (!) 97.3 F (36.3 C) (Oral)   Ht 5\' 5"  (1.651 m)   Wt 140 lb (63.5 kg)   BMI 23.30 kg/m        Assessment & Plan:  1. Essential hypertension Low sodium diet Discussed potential for swelling - amLODipine (NORVASC) 10 MG tablet; Take 1 tablet (10 mg total) by mouth daily.  Dispense: 90 tablet; Refill: 3  2. GAD (generalized anxiety disorder) Stress management - citalopram (CELEXA) 20 MG tablet; Take 1 tablet (20 mg total) by mouth daily.  Dispense: 30 tablet; Refill: 5 - clonazePAM (KLONOPIN) 0.5 MG tablet; Take 1 tablet (0.5 mg total) by mouth 2 (two) times daily  as needed for anxiety.  Dispense: 60 tablet; Refill: [place], FNP

## 2017-11-27 NOTE — Patient Instructions (Signed)
Stress and Stress Management Stress is a normal reaction to life events. It is what you feel when life demands more than you are used to or more than you can handle. Some stress can be useful. For example, the stress reaction can help you catch the last bus of the day, study for a test, or meet a deadline at work. But stress that occurs too often or for too long can cause problems. It can affect your emotional health and interfere with relationships and normal daily activities. Too much stress can weaken your immune system and increase your risk for physical illness. If you already have a medical problem, stress can make it worse. What are the causes? All sorts of life events may cause stress. An event that causes stress for one person may not be stressful for another person. Major life events commonly cause stress. These may be positive or negative. Examples include losing your job, moving into a new home, getting married, having a baby, or losing a loved one. Less obvious life events may also cause stress, especially if they occur day after day or in combination. Examples include working long hours, driving in traffic, caring for children, being in debt, or being in a difficult relationship. What are the signs or symptoms? Stress may cause emotional symptoms including, the following:  Anxiety. This is feeling worried, afraid, on edge, overwhelmed, or out of control.  Anger. This is feeling irritated or impatient.  Depression. This is feeling sad, down, helpless, or guilty.  Difficulty focusing, remembering, or making decisions.  Stress may cause physical symptoms, including the following:  Aches and pains. These may affect your head, neck, back, stomach, or other areas of your body.  Tight muscles or clenched jaw.  Low energy or trouble sleeping.  Stress may cause unhealthy behaviors, including the following:  Eating to feel better (overeating) or skipping meals.  Sleeping too little,  too much, or both.  Working too much or putting off tasks (procrastination).  Smoking, drinking alcohol, or using drugs to feel better.  How is this diagnosed? Stress is diagnosed through an assessment by your health care provider. Your health care provider will ask questions about your symptoms and any stressful life events.Your health care provider will also ask about your medical history and may order blood tests or other tests. Certain medical conditions and medicine can cause physical symptoms similar to stress. Mental illness can cause emotional symptoms and unhealthy behaviors similar to stress. Your health care provider may refer you to a mental health professional for further evaluation. How is this treated? Stress management is the recommended treatment for stress.The goals of stress management are reducing stressful life events and coping with stress in healthy ways. Techniques for reducing stressful life events include the following:  Stress identification. Self-monitor for stress and identify what causes stress for you. These skills may help you to avoid some stressful events.  Time management. Set your priorities, keep a calendar of events, and learn to say "no." These tools can help you avoid making too many commitments.  Techniques for coping with stress include the following:  Rethinking the problem. Try to think realistically about stressful events rather than ignoring them or overreacting. Try to find the positives in a stressful situation rather than focusing on the negatives.  Exercise. Physical exercise can release both physical and emotional tension. The key is to find a form of exercise you enjoy and do it regularly.  Relaxation techniques. These relax the body and  mind. Examples include yoga, meditation, tai chi, biofeedback, deep breathing, progressive muscle relaxation, listening to music, being out in nature, journaling, and other hobbies. Again, the key is to find  one or more that you enjoy and can do regularly.  Healthy lifestyle. Eat a balanced diet, get plenty of sleep, and do not smoke. Avoid using alcohol or drugs to relax.  Strong support network. Spend time with family, friends, or other people you enjoy being around.Express your feelings and talk things over with someone you trust.  Counseling or talktherapy with a mental health professional may be helpful if you are having difficulty managing stress on your own. Medicine is typically not recommended for the treatment of stress.Talk to your health care provider if you think you need medicine for symptoms of stress. Follow these instructions at home:  Keep all follow-up visits as directed by your health care provider.  Take all medicines as directed by your health care provider. Contact a health care provider if:  Your symptoms get worse or you start having new symptoms.  You feel overwhelmed by your problems and can no longer manage them on your own. Get help right away if:  You feel like hurting yourself or someone else. This information is not intended to replace advice given to you by your health care provider. Make sure you discuss any questions you have with your health care provider. Document Released: 02/07/2001 Document Revised: 01/20/2016 Document Reviewed: 04/08/2013 Elsevier Interactive Patient Education  2017 Elsevier Inc.  

## 2017-11-29 ENCOUNTER — Telehealth: Payer: Self-pay | Admitting: Nurse Practitioner

## 2017-11-30 ENCOUNTER — Other Ambulatory Visit: Payer: Self-pay

## 2017-11-30 DIAGNOSIS — I10 Essential (primary) hypertension: Secondary | ICD-10-CM

## 2017-12-07 ENCOUNTER — Telehealth: Payer: Self-pay | Admitting: Nurse Practitioner

## 2017-12-07 NOTE — Telephone Encounter (Signed)
If you have been taking then not allergic to it

## 2017-12-07 NOTE — Telephone Encounter (Signed)
Patient notified. Gave an appt for Monday and advised if ankle improved to call and cancel appt. Patient verbalized understanding.

## 2017-12-07 NOTE — Telephone Encounter (Signed)
Patient states that she has been taking IBU TID and she read on the bottle that if you are allergic to ASA (which she is) that you could have an allergic reaction. She is going to stop taking the IBU, Does she need to be seen or you to check?

## 2017-12-10 ENCOUNTER — Ambulatory Visit: Payer: Medicare Other | Admitting: Nurse Practitioner

## 2017-12-18 ENCOUNTER — Ambulatory Visit (INDEPENDENT_AMBULATORY_CARE_PROVIDER_SITE_OTHER): Payer: Medicare Other | Admitting: Nurse Practitioner

## 2017-12-18 ENCOUNTER — Encounter: Payer: Self-pay | Admitting: Nurse Practitioner

## 2017-12-18 VITALS — BP 138/77 | HR 98 | Temp 97.3°F | Ht 65.0 in | Wt 135.0 lb

## 2017-12-18 DIAGNOSIS — R609 Edema, unspecified: Secondary | ICD-10-CM | POA: Diagnosis not present

## 2017-12-18 MED ORDER — FUROSEMIDE 20 MG PO TABS: 20.0000 mg | ORAL_TABLET | Freq: Every day | ORAL | 3 refills | Status: DC

## 2017-12-18 NOTE — Progress Notes (Signed)
   Subjective:    Patient ID: Christina Silva, female    DOB: 1932-11-27, 84 y.o.   MRN: [id number]   HPI  Patient come sin today c/o of ankle edema. She was scheduled to be sen last week but her husband died on the day of appointment and she had to rescheduled. She says that her feet and ankles have been swelling for about 3 weeks. It started when she was taking ibuprofen for headache from blood pressure. She has tried some HCTZ which really has not helped with swelling. She denies any sob or headache.   Review of Systems  Respiratory: Negative.  Negative for chest tightness and shortness of breath.   Cardiovascular: Positive for leg swelling. Negative for chest pain and palpitations.  Genitourinary: Negative.   Neurological: Negative.   Psychiatric/Behavioral: Negative.   All other systems reviewed and are negative.      Objective:   Physical Exam  Constitutional: She is oriented to person, place, and time. She appears well-developed and well-nourished. No distress.  Cardiovascular: Normal rate.  Pulmonary/Chest: Effort normal.  Musculoskeletal: She exhibits edema (1+ edema bil ankles).  Neurological: She is alert and oriented to person, place, and time.  Skin: Skin is warm.  Psychiatric: She has a normal mood and affect. Her behavior is normal. Judgment and thought content normal.   BP 138/77   Pulse 98   Temp (!) 97.3 F (36.3 C) (Oral)   Ht 5\' 5"  (1.651 m)   Wt 135 lb (61.2 kg)   BMI 22.47 kg/m      Assessment & Plan:   1. Peripheral edema    Meds ordered this encounter  Medications  . furosemide (LASIX) 20 MG tablet    Sig: Take 1 tablet (20 mg total) by mouth daily.    Dispense:  30 tablet    Refill:  3    Order Specific Question:   Supervising Provider    Answer:   Eustaquio Maize [[id number]]   Elevate legs when sitting RTO prn  Mary-Margaret Hassell Done, FNP

## 2017-12-18 NOTE — Patient Instructions (Signed)

## 2017-12-19 ENCOUNTER — Encounter: Payer: Self-pay | Admitting: Cardiology

## 2017-12-24 ENCOUNTER — Telehealth: Payer: Self-pay | Admitting: Nurse Practitioner

## 2017-12-24 MED ORDER — LOSARTAN POTASSIUM 100 MG PO TABS: 100.0000 mg | ORAL_TABLET | Freq: Every day | ORAL | 3 refills | Status: DC

## 2017-12-24 NOTE — Telephone Encounter (Signed)
After doing some research and talking with someone they think that the Amolidipine she was put on is causing the swelling in her ankles. They would like to maybe try a different BP med sent to [facility] in [place]. Please contact patients daughter.

## 2017-12-24 NOTE — Telephone Encounter (Signed)
Please review

## 2017-12-24 NOTE — Telephone Encounter (Signed)
Way we can tell if coming from norvasc is if swelling dos ot resolve with lasix. Is it has not then coud be coming form blood pressure med. Stop  norvasc (amlodipine) and will change to losartan 100mg  1 po daily. rx sent to pharmacy

## 2017-12-25 ENCOUNTER — Telehealth: Payer: Self-pay | Admitting: Nurse Practitioner

## 2017-12-25 NOTE — Telephone Encounter (Signed)
I changed her to losartan yesterday. Had swelling with lisinopril. Patient aware

## 2017-12-30 NOTE — Progress Notes (Signed)
Cardiology Office Note   Date:  01/02/2018   ID:  SUAD [name], DOB 09-Jun-1933, MRN [id number]  PCP:  [name], FNP  Cardiologist:   Rhodesia Stanger [place], MD   Chief Complaint  Patient presents with  . Follow-up    pt c/o elevated BP and swelling in ankles and legs      History of Present Illness: Christina Silva is a 84 y.o. female who is seen at the request of [place] FNP for evaluation of HTN and edema. She reports that she has been to the ED 3 times in the last 3 years with severely elevated BP. Her daughter reports that she has been under a lot of stress caring for her husband who was chronically ill and just recently passed away 3 weeks ago. She was having bad HAs as well but these have resolved since her husband passed. She was having swelling in her ankles and started taking lasix daily 2 weeks ago with resolution. She denies any chest pain, dyspnea, palpitations. She has an abnormal Ecg and had some type of cardiac evaluation in 2010 at [facility] where everything checked out OK. She is active and lives alone now.     Past Medical History:  Diagnosis Date  . Anxiety   . Cataract   . GERD (gastroesophageal reflux disease)   . Hyperlipidemia   . Hypertension   . Vertigo     Past Surgical History:  Procedure Laterality Date  . ABDOMINAL HYSTERECTOMY  1980  . CATARACT EXTRACTION Right   . EYE SURGERY    . ROTATOR CUFF REPAIR Left      Current Outpatient Medications  Medication Sig Dispense Refill  . citalopram (CELEXA) 20 MG tablet Take 1 tablet (20 mg total) by mouth daily. 30 tablet 5  . estradiol (ESTRACE) 1 MG tablet TAKE ONE (1) TABLET EACH DAY (Patient taking differently: Take 1 mg by mouth daily. ) 90 tablet 1  . fluticasone (FLONASE) 50 MCG/ACT nasal spray Place 1 spray into both nostrils daily. For allergies    . furosemide (LASIX) 20 MG tablet Take 1 tablet (20 mg total) by mouth daily. 30 tablet 3  . losartan (COZAAR) 100  MG tablet Take 1 tablet (100 mg total) by mouth daily. 90 tablet 3  . Omega-3 Fatty Acids (FISH OIL) 1200 MG CAPS Take 3 capsules (3,600 mg total) by mouth daily.    Marland Kitchen omeprazole (PRILOSEC) 20 MG capsule Take 1 capsule (20 mg total) by mouth daily. 90 capsule 1   No current facility-administered medications for this visit.     Allergies:   Aspirin; Codeine; Lisinopril; Morphine; and Sulfonamide derivatives    Social History:  The patient  reports that she quit smoking about 35 years ago. Her smoking use included cigarettes. She has never used smokeless tobacco. She reports that she does not drink alcohol or use drugs.   Family History:  The patient's family history includes Epilepsy in her brother; Heart attack in her mother; Hyperlipidemia in her mother; Psychiatric Illness in her sister; Transient ischemic attack in her father.    ROS:  Please see the history of present illness.   Otherwise, review of systems are positive for none.   All other systems are reviewed and negative.    PHYSICAL EXAM: VS:  BP 130/78   Pulse 75   Ht 5\' 5"  (1.651 m)   Wt 134 lb 3.2 oz (60.9 kg)   BMI 22.33 kg/m  , BMI Body  mass index is 22.33 kg/m. GEN: Well nourished, well developed, in no acute distress  HEENT: normal  Neck: no JVD, carotid bruits, or masses Cardiac: RRR; no murmurs, rubs, or gallops,no edema  Respiratory:  clear to auscultation bilaterally, normal work of breathing GI: soft, nontender, nondistended, + BS MS: no deformity or atrophy  Skin: warm and dry, no rash Neuro:  Strength and sensation are intact Psych: euthymic mood, full affect   EKG:  EKG is not ordered today. The ekg ordered 11/26/17 demonstrates NSR with possible old septal infarct. I have personally reviewed and interpreted this study.    Recent Labs: 09/03/2017: ALT 12 11/26/2017: BUN 8; Creatinine, Ser 0.70; Hemoglobin 14.2; Platelets 305; Potassium 3.8; Sodium 140    Lipid Panel    Component Value Date/Time    CHOL 244 (H) 09/03/2017 1552   TRIG 301 (H) 09/03/2017 1552   TRIG 149 02/26/2014 0930   HDL 62 09/03/2017 1552   HDL 68 02/26/2014 0930   CHOLHDL 3.9 09/03/2017 1552   CHOLHDL 4.1 01/08/2013 0959   VLDL 39 01/08/2013 0959   LDLCALC 122 (H) 09/03/2017 1552   LDLCALC 144 (H) 02/26/2014 0930      Wt Readings from Last 3 Encounters:  01/02/18 134 lb 3.2 oz (60.9 kg)  12/18/17 135 lb (61.2 kg)  11/27/17 140 lb (63.5 kg)      Other studies Reviewed: Additional studies/ records that were reviewed today include:none   ASSESSMENT AND PLAN:  1.  HTN. BP is well controlled today. Continue current therapy. Stress may have been playing a role with her late husband's illness. 2. Edema. Resolved with taking lasix daily. Recommend sodium restriction. Will check Echo to assess LV function 3. ? Old Septal infarct on Ecg. This has not changed over the years and I doubt this represents a prior infarct. Will assess with Echo.   Current medicines are reviewed at length with the patient today.  The patient does not have concerns regarding medicines.  The following changes have been made:  no change  Labs/ tests ordered today include:   Orders Placed This Encounter  Procedures  . ECHOCARDIOGRAM COMPLETE     Disposition:   FU TBD  Signed, Renan Danese [place], MD  01/02/2018 8:26 AM    [facility] [address], [place], [place], [address] Phone [phone], Fax [phone]

## 2018-01-02 ENCOUNTER — Ambulatory Visit: Payer: Medicare Other | Admitting: Cardiology

## 2018-01-02 ENCOUNTER — Encounter: Payer: Self-pay | Admitting: Cardiology

## 2018-01-02 VITALS — BP 130/78 | HR 75 | Ht 65.0 in | Wt 134.2 lb

## 2018-01-02 DIAGNOSIS — I10 Essential (primary) hypertension: Secondary | ICD-10-CM | POA: Diagnosis not present

## 2018-01-02 DIAGNOSIS — R609 Edema, unspecified: Secondary | ICD-10-CM | POA: Diagnosis not present

## 2018-01-02 NOTE — Patient Instructions (Signed)
Continue your current therapy  We will schedule you for an Echocardiogram   

## 2018-01-11 ENCOUNTER — Ambulatory Visit (HOSPITAL_COMMUNITY): Payer: Medicare Other | Attending: Cardiology

## 2018-01-11 ENCOUNTER — Other Ambulatory Visit: Payer: Self-pay

## 2018-01-11 DIAGNOSIS — I10 Essential (primary) hypertension: Secondary | ICD-10-CM | POA: Insufficient documentation

## 2018-01-11 DIAGNOSIS — E785 Hyperlipidemia, unspecified: Secondary | ICD-10-CM | POA: Diagnosis not present

## 2018-01-11 DIAGNOSIS — R609 Edema, unspecified: Secondary | ICD-10-CM | POA: Diagnosis not present

## 2018-01-11 DIAGNOSIS — I083 Combined rheumatic disorders of mitral, aortic and tricuspid valves: Secondary | ICD-10-CM | POA: Insufficient documentation

## 2018-01-17 ENCOUNTER — Telehealth: Payer: Self-pay | Admitting: Cardiology

## 2018-01-17 NOTE — Telephone Encounter (Signed)
Returned call to patient's daughter [name] echo results given.

## 2018-01-17 NOTE — Telephone Encounter (Signed)
Pt's dte Miranda calling for Echo results done 01-11-18-pls call

## 2018-01-28 ENCOUNTER — Other Ambulatory Visit: Payer: Self-pay | Admitting: Nurse Practitioner

## 2018-01-28 DIAGNOSIS — F411 Generalized anxiety disorder: Secondary | ICD-10-CM

## 2018-01-28 DIAGNOSIS — R609 Edema, unspecified: Secondary | ICD-10-CM

## 2018-01-29 ENCOUNTER — Other Ambulatory Visit: Payer: Self-pay | Admitting: Nurse Practitioner

## 2018-01-29 DIAGNOSIS — F411 Generalized anxiety disorder: Secondary | ICD-10-CM

## 2018-01-29 DIAGNOSIS — R609 Edema, unspecified: Secondary | ICD-10-CM

## 2018-03-05 ENCOUNTER — Encounter: Payer: Self-pay | Admitting: Nurse Practitioner

## 2018-03-05 ENCOUNTER — Ambulatory Visit (INDEPENDENT_AMBULATORY_CARE_PROVIDER_SITE_OTHER): Payer: Medicare Other | Admitting: Nurse Practitioner

## 2018-03-05 VITALS — BP 113/61 | HR 78 | Temp 97.2°F | Ht 65.0 in | Wt 133.0 lb

## 2018-03-05 DIAGNOSIS — J069 Acute upper respiratory infection, unspecified: Secondary | ICD-10-CM | POA: Diagnosis not present

## 2018-03-05 MED ORDER — DOXYCYCLINE HYCLATE 100 MG PO TABS: 100.0000 mg | ORAL_TABLET | Freq: Two times a day (BID) | ORAL | 0 refills | Status: DC

## 2018-03-05 NOTE — Progress Notes (Signed)
   Subjective:    Patient ID: Christina Silva, female    DOB: [birth year], 84 y.o.   MRN: [id number]   Chief Complaint: Nasal Congestion   HPI Patient come sin today c/o cough and congestion that started Saturday. Started with sore throat and has gotten worse.    Review of Systems  Constitutional: Negative for appetite change, chills and fever.  HENT: Positive for congestion, postnasal drip, sinus pressure, sinus pain and sore throat. Negative for trouble swallowing.   Respiratory: Positive for cough (productive- turnd from clear to green).   Cardiovascular: Negative.   Gastrointestinal: Negative.   Genitourinary: Negative.   Neurological: Negative.   Psychiatric/Behavioral: Negative.   All other systems reviewed and are negative.      Objective:   Physical Exam  Constitutional: She is oriented to person, place, and time. She appears well-developed and well-nourished.  HENT:  Right Ear: Hearing, tympanic membrane, external ear and ear canal normal.  Left Ear: Hearing, tympanic membrane, external ear and ear canal normal.  Nose: Mucosal edema and rhinorrhea present. Right sinus exhibits maxillary sinus tenderness. Left sinus exhibits maxillary sinus tenderness.  Mouth/Throat: Uvula is midline, oropharynx is clear and moist and mucous membranes are normal.  Eyes: Pupils are equal, round, and reactive to light.  Neck: Normal range of motion. Neck supple.  Cardiovascular: Normal rate.  Pulmonary/Chest: Effort normal.  Lymphadenopathy:    She has no cervical adenopathy.  Neurological: She is alert and oriented to person, place, and time.  Skin: Skin is warm.  Psychiatric: She has a normal mood and affect. Her behavior is normal. Thought content normal.  Nursing note and vitals reviewed.   BP 113/61   Pulse 78   Temp (!) 97.2 F (36.2 C) (Oral)   Ht 5\' 5"  (1.651 m)   Wt 133 lb (60.3 kg)   BMI 22.13 kg/m        Assessment & Plan:  Christina Silva in today with  chief complaint of Nasal Congestion   1. Upper respiratory infection with cough and congestion 1. Take meds as prescribed 2. Use a cool mist humidifier especially during the winter months and when heat has been humid. 3. Use saline nose sprays frequently 4. Saline irrigations of the nose can be very helpful if done frequently.  * 4X daily for 1 week*  * Use of a nettie pot can be helpful with this. Follow directions with this* 5. Drink plenty of fluids 6. Keep thermostat turn down low 7.For any cough or congestion  Use plain Mucinex- regular strength or max strength is fine   * Children- consult with Pharmacist for dosing 8. For fever or aces or pains- take tylenol or ibuprofen appropriate for age and weight.  * for fevers greater than 101 orally you may alternate ibuprofen and tylenol every  3 hours.    - doxycycline (VIBRA-TABS) 100 MG tablet; Take 1 tablet (100 mg total) by mouth 2 (two) times daily. 1 po bid  Dispense: 14 tablet; Refill: 0  Mary-Margaret Hassell Done, FNP

## 2018-03-05 NOTE — Patient Instructions (Signed)

## 2018-03-08 ENCOUNTER — Telehealth: Payer: Self-pay | Admitting: Nurse Practitioner

## 2018-03-08 NOTE — Telephone Encounter (Signed)
Mother answered phone and will relay message that nurse called from [facility].

## 2018-03-13 ENCOUNTER — Encounter: Payer: Self-pay | Admitting: Pediatrics

## 2018-03-13 ENCOUNTER — Ambulatory Visit (INDEPENDENT_AMBULATORY_CARE_PROVIDER_SITE_OTHER): Payer: Medicare Other | Admitting: Pediatrics

## 2018-03-13 VITALS — BP 144/75 | HR 73 | Temp 97.5°F | Resp 20 | Ht 65.0 in | Wt 134.0 lb

## 2018-03-13 DIAGNOSIS — I10 Essential (primary) hypertension: Secondary | ICD-10-CM | POA: Diagnosis not present

## 2018-03-13 DIAGNOSIS — J069 Acute upper respiratory infection, unspecified: Secondary | ICD-10-CM | POA: Diagnosis not present

## 2018-03-13 DIAGNOSIS — E785 Hyperlipidemia, unspecified: Secondary | ICD-10-CM

## 2018-03-13 DIAGNOSIS — B373 Candidiasis of vulva and vagina: Secondary | ICD-10-CM

## 2018-03-13 DIAGNOSIS — B379 Candidiasis, unspecified: Secondary | ICD-10-CM | POA: Diagnosis not present

## 2018-03-13 LAB — BMP8+EGFR
BUN/Creatinine Ratio: 16 (ref 12–28)
BUN: 10 mg/dL (ref 8–27)
CO2: 23 mmol/L (ref 20–29)
Calcium: 9.5 mg/dL (ref 8.7–10.3)
Chloride: 102 mmol/L (ref 96–106)
Creatinine, Ser: 0.64 mg/dL (ref 0.57–1.00)
GFR, EST AFRICAN AMERICAN: 96 mL/min/{1.73_m2} (ref >59)
GFR, EST NON AFRICAN AMERICAN: 83 mL/min/{1.73_m2} (ref >59)
Glucose: 88 mg/dL (ref 65–99)
POTASSIUM: 4.2 mmol/L (ref 3.5–5.2)
SODIUM: 140 mmol/L (ref 134–144)

## 2018-03-13 LAB — LIPID PANEL
CHOL/HDL RATIO: 3.6 ratio (ref 0.0–4.4)
Cholesterol, Total: 215 mg/dL — ABNORMAL HIGH (ref 100–199)
HDL: 59 mg/dL (ref >39)
LDL CALC: 111 mg/dL — AB (ref 0–99)
Triglycerides: 225 mg/dL — ABNORMAL HIGH (ref 0–149)
VLDL CHOLESTEROL CAL: 45 mg/dL — AB (ref 5–40)

## 2018-03-13 MED ORDER — FLUCONAZOLE 150 MG PO TABS: 150.0000 mg | ORAL_TABLET | Freq: Once | ORAL | 0 refills | Status: AC

## 2018-03-13 NOTE — Progress Notes (Signed)
  Subjective:   Patient ID: Christina Silva, female    DOB: May 16, 1933, 84 y.o.   MRN: [id number] CC: Cough and Chest congestion  HPI: Christina Silva is a 84 y.o. female   Symptoms have been ongoing for about a week and half.  She is feeling back to her normal self though she still has a mostly dry cough.  Appetite has been normal.  No fevers.  No shortness of breath.  Some nasal congestion.  She took 10 days of doxycycline, took her last dose a couple days ago.  Relevant past medical, surgical, family and social history reviewed. Allergies and medications reviewed and updated. Social History   Tobacco Use  Smoking Status Former Smoker  . Types: Cigarettes  . Last attempt to quit: 01/09/1983  . Years since quitting: 35.1  Smokeless Tobacco Never Used   ROS: Per HPI   Objective:    BP (!) 144/75   Pulse 73   Temp (!) 97.5 F (36.4 C) (Oral)   Resp 20   Ht _0  (1.651 m)   Wt 134 lb (60.8 kg)   SpO2 96%   BMI 22.30 kg/m   Wt Readings from Last 3 Encounters:  03/13/18 134 lb (60.8 kg)  03/05/18 133 lb (60.3 kg)  01/02/18 134 lb 3.2 oz (60.9 kg)    Gen: NAD, alert, cooperative with exam, NCAT EYES: EOMI, no conjunctival injection, or no icterus ENT: Layering clear fluid left TM, normal right TM, OP without erythema LYMPH: no cervical LAD CV: NRRR, normal S1/S2, no murmur, distal pulses 2+ b/l Resp: CTABL, no wheezes, normal WOB Ext: No edema, warm Neuro: Alert and oriented, strength equal b/l UE and LE, coordination grossly normal  Assessment & Plan:  Christina Silva was seen today for cough and chest congestion.  Diagnoses and all orders for this visit:  Cough Ongoing for a week and a half.  Improving.  Low normal lung exam today.  Suspect will continue to improve as she recovers from URI.  Return precautions discussed.  Hyperlipidemia, unspecified hyperlipidemia type Fasting today, will check cholesterol panel. -     Lipid panel  Essential hypertension Slightly  elevated today, she says numbers are better at home.  Continue to follow -     BMP8+EGFR  Yeast infection Started symptoms since recent antibiotics.  If not improving okay to take below. -     fluconazole (DIFLUCAN) 150 MG tablet; Take 1 tablet (150 mg total) by mouth once for 1 dose.   Follow up plan: Return in about 3 months (around 06/13/2018). Assunta Found, MD [place]

## 2018-04-24 DIAGNOSIS — D229 Melanocytic nevi, unspecified: Secondary | ICD-10-CM | POA: Diagnosis not present

## 2018-04-24 DIAGNOSIS — L570 Actinic keratosis: Secondary | ICD-10-CM | POA: Diagnosis not present

## 2018-05-09 ENCOUNTER — Other Ambulatory Visit: Payer: Self-pay | Admitting: Nurse Practitioner

## 2018-05-09 DIAGNOSIS — F411 Generalized anxiety disorder: Secondary | ICD-10-CM

## 2018-05-09 DIAGNOSIS — K219 Gastro-esophageal reflux disease without esophagitis: Secondary | ICD-10-CM

## 2018-06-04 DIAGNOSIS — D229 Melanocytic nevi, unspecified: Secondary | ICD-10-CM | POA: Diagnosis not present

## 2018-06-04 DIAGNOSIS — L570 Actinic keratosis: Secondary | ICD-10-CM | POA: Diagnosis not present

## 2018-06-04 DIAGNOSIS — D692 Other nonthrombocytopenic purpura: Secondary | ICD-10-CM | POA: Diagnosis not present

## 2018-06-10 ENCOUNTER — Ambulatory Visit (INDEPENDENT_AMBULATORY_CARE_PROVIDER_SITE_OTHER): Payer: Medicare Other

## 2018-06-10 DIAGNOSIS — Z23 Encounter for immunization: Secondary | ICD-10-CM | POA: Diagnosis not present

## 2018-07-23 ENCOUNTER — Other Ambulatory Visit: Payer: Self-pay | Admitting: Pediatrics

## 2018-08-05 ENCOUNTER — Other Ambulatory Visit: Payer: Self-pay | Admitting: Nurse Practitioner

## 2018-08-05 DIAGNOSIS — K219 Gastro-esophageal reflux disease without esophagitis: Secondary | ICD-10-CM

## 2018-08-05 NOTE — Telephone Encounter (Signed)
Last seen 03/13/18  MMM

## 2018-08-09 ENCOUNTER — Ambulatory Visit: Payer: Medicare Other | Admitting: Nurse Practitioner

## 2018-08-16 ENCOUNTER — Ambulatory Visit (INDEPENDENT_AMBULATORY_CARE_PROVIDER_SITE_OTHER): Payer: Medicare Other | Admitting: Nurse Practitioner

## 2018-08-16 ENCOUNTER — Encounter: Payer: Self-pay | Admitting: Nurse Practitioner

## 2018-08-16 VITALS — BP 132/67 | HR 69 | Temp 97.1°F | Ht 65.0 in | Wt 142.0 lb

## 2018-08-16 DIAGNOSIS — R300 Dysuria: Secondary | ICD-10-CM

## 2018-08-16 DIAGNOSIS — I10 Essential (primary) hypertension: Secondary | ICD-10-CM | POA: Diagnosis not present

## 2018-08-16 DIAGNOSIS — K219 Gastro-esophageal reflux disease without esophagitis: Secondary | ICD-10-CM

## 2018-08-16 DIAGNOSIS — M8588 Other specified disorders of bone density and structure, other site: Secondary | ICD-10-CM | POA: Diagnosis not present

## 2018-08-16 DIAGNOSIS — E785 Hyperlipidemia, unspecified: Secondary | ICD-10-CM

## 2018-08-16 DIAGNOSIS — N3000 Acute cystitis without hematuria: Secondary | ICD-10-CM

## 2018-08-16 DIAGNOSIS — F411 Generalized anxiety disorder: Secondary | ICD-10-CM

## 2018-08-16 LAB — URINALYSIS, COMPLETE
BILIRUBIN UA: NEGATIVE
GLUCOSE, UA: NEGATIVE
KETONES UA: NEGATIVE
Nitrite, UA: POSITIVE — AB
SPEC GRAV UA: 1.02 (ref 1.005–1.030)
Urobilinogen, Ur: 0.2 mg/dL (ref 0.2–1.0)
pH, UA: 6 (ref 5.0–7.5)

## 2018-08-16 LAB — MICROSCOPIC EXAMINATION
Renal Epithel, UA: NONE SEEN /hpf
WBC, UA: >30 /hpf — AB (ref 0–5)

## 2018-08-16 MED ORDER — DIAZEPAM 2 MG PO TABS: 2.0000 mg | ORAL_TABLET | Freq: Three times a day (TID) | ORAL | 1 refills | Status: DC | PRN

## 2018-08-16 MED ORDER — LOSARTAN POTASSIUM 100 MG PO TABS
100.0000 mg | ORAL_TABLET | Freq: Every day | ORAL | 1 refills | Status: DC
Start: 2018-08-16 — End: 2018-10-19

## 2018-08-16 MED ORDER — ESTRADIOL 1 MG PO TABS: ORAL_TABLET | ORAL | 1 refills | Status: DC

## 2018-08-16 MED ORDER — CIPROFLOXACIN HCL 500 MG PO TABS: 500.0000 mg | ORAL_TABLET | Freq: Two times a day (BID) | ORAL | 0 refills | Status: DC

## 2018-08-16 NOTE — Addendum Note (Signed)
Addended by: Rolena Infante on: 08/16/2018 08:49 AM   Modules accepted: Orders

## 2018-08-16 NOTE — Addendum Note (Signed)
Addended by: [name] on: 08/16/2018 09:00 AM   Modules accepted: Orders

## 2018-08-16 NOTE — Patient Instructions (Signed)
Fall Prevention in the Home, Adult  Falls can cause injuries. They can happen to people of all ages. There are many things you can do to make your home safe and to help prevent falls. Ask for help when making these changes, if needed.  What actions can I take to prevent falls?  General Instructions  · Use good lighting in all rooms. Replace any light bulbs that burn out.  · Turn on the lights when you go into a dark area. Use night-lights.  · Keep items that you use often in easy-to-reach places. Lower the shelves around your home if necessary.  · Set up your furniture so you have a clear path. Avoid moving your furniture around.  · Do not have throw rugs and other things on the floor that can make you trip.  · Avoid walking on wet floors.  · If any of your floors are uneven, fix them.  · Add color or contrast paint or tape to clearly mark and help you see:  ? Any grab bars or handrails.  ? First and last steps of stairways.  ? Where the edge of each step is.  · If you use a stepladder:  ? Make sure that it is fully opened. Do not climb a closed stepladder.  ? Make sure that both sides of the stepladder are locked into place.  ? Ask someone to hold the stepladder for you while you use it.  · If there are any pets around you, be aware of where they are.  What can I do in the bathroom?         · Keep the floor dry. Clean up any water that spills onto the floor as soon as it happens.  · Remove soap buildup in the tub or shower regularly.  · Use non-skid mats or decals on the floor of the tub or shower.  · Attach bath mats securely with double-sided, non-slip rug tape.  · If you need to sit down in the shower, use a plastic, non-slip stool.  · Install grab bars by the toilet and in the tub and shower. Do not use towel bars as grab bars.  What can I do in the bedroom?  · Make sure that you have a light by your bed that is easy to reach.  · Do not use any sheets or blankets that are too big for your bed. They should  not hang down onto the floor.  · Have a firm chair that has side arms. You can use this for support while you get dressed.  What can I do in the kitchen?  · Clean up any spills right away.  · If you need to reach something above you, use a strong step stool that has a grab bar.  · Keep electrical cords out of the way.  · Do not use floor polish or wax that makes floors slippery. If you must use wax, use non-skid floor wax.  What can I do with my stairs?  · Do not leave any items on the stairs.  · Make sure that you have a light switch at the top of the stairs and the bottom of the stairs. If you do not have them, ask someone to add them for you.  · Make sure that there are handrails on both sides of the stairs, and use them. Fix handrails that are broken or loose. Make sure that handrails are as long as the stairways.  ·   Install non-slip stair treads on all stairs in your home.  · Avoid having throw rugs at the top or bottom of the stairs. If you do have throw rugs, attach them to the floor with carpet tape.  · Choose a carpet that does not hide the edge of the steps on the stairway.  · Check any carpeting to make sure that it is firmly attached to the stairs. Fix any carpet that is loose or worn.  What can I do on the outside of my home?  · Use bright outdoor lighting.  · Regularly fix the edges of walkways and driveways and fix any cracks.  · Remove anything that might make you trip as you walk through a door, such as a raised step or threshold.  · Trim any bushes or trees on the path to your home.  · Regularly check to see if handrails are loose or broken. Make sure that both sides of any steps have handrails.  · Install guardrails along the edges of any raised decks and porches.  · Clear walking paths of anything that might make someone trip, such as tools or rocks.  · Have any leaves, snow, or ice cleared regularly.  · Use sand or salt on walking paths during winter.  · Clean up any spills in your garage right  away. This includes grease or oil spills.  What other actions can I take?  · Wear shoes that:  ? Have a low heel. Do not wear high heels.  ? Have rubber bottoms.  ? Are comfortable and fit you well.  ? Are closed at the toe. Do not wear open-toe sandals.  · Use tools that help you move around (mobility aids) if they are needed. These include:  ? Canes.  ? Walkers.  ? Scooters.  ? Crutches.  · Review your medicines with your doctor. Some medicines can make you feel dizzy. This can increase your chance of falling.  Ask your doctor what other things you can do to help prevent falls.  Where to find more information  · Centers for Disease Control and Prevention, [employer]: https://cdc.gov  · National Institute on Aging: https://go4life.nia.nih.gov  Contact a doctor if:  · You are afraid of falling at home.  · You feel weak, drowsy, or dizzy at home.  · You fall at home.  Summary  · There are many simple things that you can do to make your home safe and to help prevent falls.  · Ways to make your home safe include removing tripping hazards and installing grab bars in the bathroom.  · Ask for help when making these changes in your home.  This information is not intended to replace advice given to you by your health care provider. Make sure you discuss any questions you have with your health care provider.  Document Released: 06/10/2009 Document Revised: 03/29/2017 Document Reviewed: 03/29/2017  Elsevier Interactive Patient Education © 2019 Elsevier Inc.

## 2018-08-16 NOTE — Progress Notes (Addendum)
Subjective:    Patient ID: Christina Silva, female    DOB: 07-22-1933, 84 y.o.   MRN: [id number]   Chief Complaint: medical management of chronic issues  HPI:  1. Essential hypertension  No c/o chest pain sob or headache. Does not check blood pressure at home. BP Readings from Last 3 Encounters:  03/13/18 (!) 144/75  03/05/18 113/61  01/02/18 130/78     2. Gastroesophageal reflux disease without esophagitis  Is currently on omeprazole daily and works well to keep symptoms under control  3. Osteopenia of spine  Last dexascan was done on 05/18/15 with t score of -1.6. she is currently on no calcium or vitamin d supplement. She does stay active but no dedicated weight bearing exercises  4. Hyperlipidemia with target LDL less than 100  Does not really watch what she eats  5. Anxiety state  She was put on celexa and she did not ike how it made her feel. She would rather be on valium on prn basis. Does not feel like she would need very often.    Outpatient Encounter Medications as of 08/16/2018  Medication Sig  . citalopram (CELEXA) 20 MG tablet TAKE ONE (1) TABLET EACH DAY  . diazepam (VALIUM) 2 MG tablet TAKE ONE TABLET TWICE DAILY AS NEEDED  . estradiol (ESTRACE) 1 MG tablet TAKE ONE (1) TABLET EACH DAY  . fluticasone (FLONASE) 50 MCG/ACT nasal spray Place 1 spray into both nostrils daily. For allergies  . furosemide (LASIX) 20 MG tablet TAKE ONE (1) TABLET EACH DAY  . losartan (COZAAR) 100 MG tablet Take 1 tablet (100 mg total) by mouth daily.  . Omega-3 Fatty Acids (FISH OIL) 1200 MG CAPS Take 3 capsules (3,600 mg total) by mouth daily.  Marland Kitchen omeprazole (PRILOSEC) 20 MG capsule TAKE ONE (1) CAPSULE EACH DAY       New complaints: None  today  Social history: Lives alone now. Her husband passed away this past year. She has a daughter and son that check on her often.   Review of Systems  Constitutional: Negative for activity change and appetite change.  HENT: Negative.    Eyes: Negative for pain.  Respiratory: Negative for shortness of breath.   Cardiovascular: Negative for chest pain, palpitations and leg swelling.  Gastrointestinal: Negative for abdominal pain.  Endocrine: Negative for polydipsia.  Genitourinary: Negative.   Skin: Negative for rash.  Neurological: Negative for dizziness, weakness and headaches.  Hematological: Does not bruise/bleed easily.  Psychiatric/Behavioral: Negative.   All other systems reviewed and are negative.      Objective:   Physical Exam Vitals signs and nursing note reviewed.  Constitutional:      General: She is not in acute distress.    Appearance: Normal appearance. She is well-developed.  HENT:     Head: Normocephalic.     Nose: Nose normal.  Eyes:     Pupils: Pupils are equal, round, and reactive to light.  Neck:     Musculoskeletal: Normal range of motion and neck supple.     Vascular: No carotid bruit or JVD.  Cardiovascular:     Rate and Rhythm: Normal rate and regular rhythm.     Heart sounds: Normal heart sounds.  Pulmonary:     Effort: Pulmonary effort is normal. No respiratory distress.     Breath sounds: Normal breath sounds. No wheezing or rales.  Chest:     Chest wall: No tenderness.  Abdominal:     General: Bowel sounds are  normal. There is no distension or abdominal bruit.     Palpations: Abdomen is soft. There is no hepatomegaly, splenomegaly, mass or pulsatile mass.     Tenderness: There is no abdominal tenderness.  Musculoskeletal: Normal range of motion.  Lymphadenopathy:     Cervical: No cervical adenopathy.  Skin:    General: Skin is warm and dry.  Neurological:     Mental Status: She is alert and oriented to person, place, and time.     Deep Tendon Reflexes: Reflexes are normal and symmetric.  Psychiatric:        Behavior: Behavior normal.        Thought Content: Thought content normal.        Judgment: Judgment normal.    BP 132/67   Pulse 69   Temp (!) 97.1 F (36.2  C) (Oral)   Ht '5\' 5"'  (1.651 m)   Wt 142 lb (64.4 kg)   BMI 23.63 kg/m      Assessment & Plan:  Christina Silva comes in today with chief complaint of Medical Management of Chronic Issues   Diagnosis and orders addressed:  1. Essential hypertension Low sodium diet - losartan (COZAAR) 100 MG tablet; Take 1 tablet (100 mg total) by mouth daily.  Dispense: 90 tablet; Refill: 1 - CMP14+EGFR - CBC with Differential/Platelet  2. Gastroesophageal reflux disease without esophagitis Avoid spicy foods Do not eat 2 hours prior to bedtime  3. Osteopenia of spine Weight bearing exercises Continue daily vitamin supplement  4. Hyperlipidemia with target LDL less than 100 Low fat diet - Lipid panel  5. Anxiety state Stress management - diazepam (VALIUM) 2 MG tablet; Take 1 tablet (2 mg total) by mouth every 8 (eight) hours as needed for anxiety.  Dispense: 60 tablet; Refill: 1  6. Acute cystitis -cipro500 mg 1 po bid #10 0 refills Take medication as prescribe Cotton underwear Take shower not bath Cranberry juice, yogurt Force fluids AZO over the counter X2 days Culture pending RTO prn   Labs pending Health Maintenance reviewed Diet and exercise encouraged  Follow up plan: 3 month   [place], FNP

## 2018-08-17 LAB — CBC WITH DIFFERENTIAL/PLATELET
Basophils Absolute: 0 10*3/uL (ref 0.0–0.2)
Basos: 1 %
EOS (ABSOLUTE): 0.1 10*3/uL (ref 0.0–0.4)
Eos: 2 %
HEMATOCRIT: 41.5 % (ref 34.0–46.6)
Hemoglobin: 13.8 g/dL (ref 11.1–15.9)
Immature Grans (Abs): 0 10*3/uL (ref 0.0–0.1)
Immature Granulocytes: 0 %
Lymphocytes Absolute: 1.5 10*3/uL (ref 0.7–3.1)
Lymphs: 28 %
MCH: 30.4 pg (ref 26.6–33.0)
MCHC: 33.3 g/dL (ref 31.5–35.7)
MCV: 91 fL (ref 79–97)
MONOCYTES: 9 %
Monocytes Absolute: 0.5 10*3/uL (ref 0.1–0.9)
Neutrophils Absolute: 3.3 10*3/uL (ref 1.4–7.0)
Neutrophils: 60 %
Platelets: 284 10*3/uL (ref 150–450)
RBC: 4.54 x10E6/uL (ref 3.77–5.28)
RDW: 12.2 % — AB (ref 12.3–15.4)
WBC: 5.5 10*3/uL (ref 3.4–10.8)

## 2018-08-17 LAB — CMP14+EGFR
ALT: 19 IU/L (ref 0–32)
AST: 21 IU/L (ref 0–40)
Albumin/Globulin Ratio: 1.8 (ref 1.2–2.2)
Albumin: 4 g/dL (ref 3.5–4.7)
Alkaline Phosphatase: 76 IU/L (ref 39–117)
BILIRUBIN TOTAL: 0.4 mg/dL (ref 0.0–1.2)
BUN/Creatinine Ratio: 15 (ref 12–28)
BUN: 11 mg/dL (ref 8–27)
CHLORIDE: 102 mmol/L (ref 96–106)
CO2: 21 mmol/L (ref 20–29)
Calcium: 9.6 mg/dL (ref 8.7–10.3)
Creatinine, Ser: 0.73 mg/dL (ref 0.57–1.00)
GFR calc non Af Amer: 76 mL/min/{1.73_m2} (ref >59)
GFR, EST AFRICAN AMERICAN: 88 mL/min/{1.73_m2} (ref >59)
Globulin, Total: 2.2 g/dL (ref 1.5–4.5)
Glucose: 93 mg/dL (ref 65–99)
Potassium: 4.1 mmol/L (ref 3.5–5.2)
Sodium: 140 mmol/L (ref 134–144)
Total Protein: 6.2 g/dL (ref 6.0–8.5)

## 2018-08-17 LAB — LIPID PANEL
Chol/HDL Ratio: 3.9 ratio (ref 0.0–4.4)
Cholesterol, Total: 239 mg/dL — ABNORMAL HIGH (ref 100–199)
HDL: 62 mg/dL (ref >39)
LDL Calculated: 128 mg/dL — ABNORMAL HIGH (ref 0–99)
Triglycerides: 244 mg/dL — ABNORMAL HIGH (ref 0–149)
VLDL Cholesterol Cal: 49 mg/dL — ABNORMAL HIGH (ref 5–40)

## 2018-08-18 LAB — URINE CULTURE

## 2018-09-26 ENCOUNTER — Ambulatory Visit (INDEPENDENT_AMBULATORY_CARE_PROVIDER_SITE_OTHER): Payer: Medicare Other | Admitting: Nurse Practitioner

## 2018-09-26 ENCOUNTER — Encounter: Payer: Self-pay | Admitting: Nurse Practitioner

## 2018-09-26 VITALS — BP 159/82 | HR 62 | Temp 96.6°F | Ht 65.0 in | Wt 145.0 lb

## 2018-09-26 DIAGNOSIS — R300 Dysuria: Secondary | ICD-10-CM

## 2018-09-26 DIAGNOSIS — B373 Candidiasis of vulva and vagina: Secondary | ICD-10-CM

## 2018-09-26 DIAGNOSIS — B3731 Acute candidiasis of vulva and vagina: Secondary | ICD-10-CM

## 2018-09-26 LAB — MICROSCOPIC EXAMINATION
Epithelial Cells (non renal): >10 /hpf — AB (ref 0–10)
Renal Epithel, UA: NONE SEEN /hpf

## 2018-09-26 LAB — URINALYSIS, COMPLETE
BILIRUBIN UA: NEGATIVE
Glucose, UA: NEGATIVE
Ketones, UA: NEGATIVE
Nitrite, UA: NEGATIVE
Protein, UA: NEGATIVE
RBC, UA: NEGATIVE
Specific Gravity, UA: 1.015 (ref 1.005–1.030)
Urobilinogen, Ur: 0.2 mg/dL (ref 0.2–1.0)
pH, UA: 7 (ref 5.0–7.5)

## 2018-09-26 MED ORDER — FLUCONAZOLE 150 MG PO TABS: 150.0000 mg | ORAL_TABLET | Freq: Once | ORAL | 0 refills | Status: AC

## 2018-09-26 NOTE — Patient Instructions (Signed)
Vaginal Yeast infection, Adult    Vaginal yeast infection is a condition that causes vaginal discharge as well as soreness, swelling, and redness (inflammation) of the vagina. This is a common condition. Some women get this infection frequently.  What are the causes?  This condition is caused by a change in the normal balance of the yeast (candida) and bacteria that live in the vagina. This change causes an overgrowth of yeast, which causes the inflammation.  What increases the risk?  The condition is more likely to develop in women who:   Take antibiotic medicines.   Have diabetes.   Take birth control pills.   Are pregnant.   Douche often.   Have a weak body defense system (immune system).   Have been taking steroid medicines for a long time.   Frequently wear tight clothing.  What are the signs or symptoms?  Symptoms of this condition include:   White, thick, creamy vaginal discharge.   Swelling, itching, redness, and irritation of the vagina. The lips of the vagina (vulva) may be affected as well.   Pain or a burning feeling while urinating.   Pain during sex.  How is this diagnosed?  This condition is diagnosed based on:   Your medical history.   A physical exam.   A pelvic exam. Your health care provider will examine a sample of your vaginal discharge under a microscope. Your health care provider may send this sample for testing to confirm the diagnosis.  How is this treated?  This condition is treated with medicine. Medicines may be over-the-counter or prescription. You may be told to use one or more of the following:   Medicine that is taken by mouth (orally).   Medicine that is applied as a cream (topically).   Medicine that is inserted directly into the vagina (suppository).  Follow these instructions at home:    Lifestyle   Do not have sex until your health care provider approves. Tell your sex partner that you have a yeast infection. That person should go to his or her health care  provider and ask if they should also be treated.   Do not wear tight clothes, such as pantyhose or tight pants.   Wear breathable cotton underwear.  General instructions   Take or apply over-the-counter and prescription medicines only as told by your health care provider.   Eat more yogurt. This may help to keep your yeast infection from returning.   Do not use tampons until your health care provider approves.   Try taking a sitz bath to help with discomfort. This is a warm water bath that is taken while you are sitting down. The water should only come up to your hips and should cover your buttocks. Do this 3-4 times per day or as told by your health care provider.   Do not douche.   If you have diabetes, keep your blood sugar levels under control.   Keep all follow-up visits as told by your health care provider. This is important.  Contact a health care provider if:   You have a fever.   Your symptoms go away and then return.   Your symptoms do not get better with treatment.   Your symptoms get worse.   You have new symptoms.   You develop blisters in or around your vagina.   You have blood coming from your vagina and it is not your menstrual period.   You develop pain in your abdomen.  Summary     Vaginal yeast infection is a condition that causes discharge as well as soreness, swelling, and redness (inflammation) of the vagina.   This condition is treated with medicine. Medicines may be over-the-counter or prescription.   Take or apply over-the-counter and prescription medicines only as told by your health care provider.   Do not douche. Do not have sex or use tampons until your health care provider approves.   Contact a health care provider if your symptoms do not get better with treatment or your symptoms go away and then return.  This information is not intended to replace advice given to you by your health care provider. Make sure you discuss any questions you have with your health care  provider.  Document Released: 05/24/2005 Document Revised: 12/31/2017 Document Reviewed: 12/31/2017  Elsevier Interactive Patient Education  2019 Elsevier Inc.

## 2018-09-26 NOTE — Progress Notes (Signed)
   Subjective:    Patient ID: Christina Silva, female    DOB: 1933-02-23, 85 y.o.   MRN: [id number]   Chief Complaint: Dysuria   HPI Patient come sin c/o dysuria and slight vaginal itching. She had UTI about 1 month ago and she wants to make sure it has cleared up.   Review of Systems  Constitutional: Negative.   HENT: Negative.   Respiratory: Negative.   Cardiovascular: Negative.   Genitourinary: Negative.   Neurological: Negative.   Psychiatric/Behavioral: Negative.   All other systems reviewed and are negative.      Objective:   Physical Exam Constitutional:      General: She is not in acute distress.    Appearance: She is normal weight.  Cardiovascular:     Rate and Rhythm: Normal rate and regular rhythm.     Heart sounds: Normal heart sounds.  Pulmonary:     Effort: Pulmonary effort is normal.     Breath sounds: Normal breath sounds.  Abdominal:     Tenderness: There is no right CVA tenderness or left CVA tenderness.  Skin:    General: Skin is warm and dry.  Neurological:     General: No focal deficit present.     Mental Status: She is alert and oriented to person, place, and time.  Psychiatric:        Mood and Affect: Mood normal.        Behavior: Behavior normal.    BP (!) 159/82   Pulse 62   Temp (!) 96.6 F (35.9 C) (Oral)   Ht 5\' 5"  (1.651 m)   Wt 145 lb (65.8 kg)   BMI 24.13 kg/m   Urine 3-5 wbc  Yeast visualized       Assessment & Plan:  Christina Silva in today with chief complaint of Dysuria   1. Dysuria - Urinalysis, Complete  2. Vaginal candidiasis Meds ordered this encounter  Medications  . fluconazole (DIFLUCAN) 150 MG tablet    Sig: Take 1 tablet (150 mg total) by mouth once for 1 dose.    Dispense:  1 tablet    Refill:  0    Order Specific Question:   Supervising Provider    Answer:   Worthy Rancher [[id number]]   Force fluids Avoid bubble baths RTO prn  Mary-Margaret Hassell Done, FNP

## 2018-09-28 DIAGNOSIS — I639 Cerebral infarction, unspecified: Secondary | ICD-10-CM

## 2018-09-28 HISTORY — DX: Cerebral infarction, unspecified: I63.9

## 2018-10-08 ENCOUNTER — Other Ambulatory Visit: Payer: Self-pay | Admitting: Nurse Practitioner

## 2018-10-08 DIAGNOSIS — F411 Generalized anxiety disorder: Secondary | ICD-10-CM

## 2018-10-09 DIAGNOSIS — H04123 Dry eye syndrome of bilateral lacrimal glands: Secondary | ICD-10-CM | POA: Diagnosis not present

## 2018-10-09 DIAGNOSIS — H40033 Anatomical narrow angle, bilateral: Secondary | ICD-10-CM | POA: Diagnosis not present

## 2018-10-11 ENCOUNTER — Other Ambulatory Visit: Payer: Self-pay | Admitting: Physician Assistant

## 2018-10-11 ENCOUNTER — Telehealth: Payer: Self-pay | Admitting: Nurse Practitioner

## 2018-10-11 MED ORDER — HYDROCHLOROTHIAZIDE 25 MG PO TABS: 25.0000 mg | ORAL_TABLET | Freq: Every day | ORAL | 0 refills | Status: DC

## 2018-10-11 NOTE — Telephone Encounter (Signed)
Aware. 

## 2018-10-11 NOTE — Telephone Encounter (Signed)
Prescription has been sent for hydrochlorothiazide 25 mg #30.

## 2018-10-11 NOTE — Telephone Encounter (Signed)
PT is calling to ask if MMM will call the pt in a fluid pill for her Right knee, it has had some fluid on it since yesterday. Pt is aware that MMM is not here and this will be sent to covering provider.   PT is not sure if the BP medication has the fluid medication in it, she states that one that she use to take did but MMM has gave her fluid pill before.   Pharmacy [facility]

## 2018-10-11 NOTE — Telephone Encounter (Signed)
Patient is having swelling in her right knee.  She reports that Shelah Lewandowsky had given her Lasix in the past to help with this and she will take it PRN but is out of it now.  She would like to know if you will send this in.  Please advise.

## 2018-10-16 DIAGNOSIS — M25561 Pain in right knee: Secondary | ICD-10-CM | POA: Diagnosis not present

## 2018-10-16 DIAGNOSIS — M11261 Other chondrocalcinosis, right knee: Secondary | ICD-10-CM | POA: Diagnosis not present

## 2018-10-18 ENCOUNTER — Other Ambulatory Visit: Payer: Self-pay

## 2018-10-18 ENCOUNTER — Emergency Department (HOSPITAL_COMMUNITY): Payer: Medicare Other

## 2018-10-18 ENCOUNTER — Observation Stay (HOSPITAL_COMMUNITY): Payer: Medicare Other

## 2018-10-18 ENCOUNTER — Encounter (HOSPITAL_COMMUNITY): Payer: Self-pay | Admitting: Emergency Medicine

## 2018-10-18 ENCOUNTER — Inpatient Hospital Stay (HOSPITAL_COMMUNITY)
Admission: EM | Admit: 2018-10-18 | Discharge: 2018-10-19 | DRG: 066 | Disposition: A | Discharge status: Home / Self Care | Payer: Medicare Other | Attending: Family Medicine | Admitting: Family Medicine

## 2018-10-18 DIAGNOSIS — R531 Weakness: Secondary | ICD-10-CM | POA: Diagnosis not present

## 2018-10-18 DIAGNOSIS — Z87891 Personal history of nicotine dependence: Secondary | ICD-10-CM

## 2018-10-18 DIAGNOSIS — G4700 Insomnia, unspecified: Secondary | ICD-10-CM | POA: Diagnosis not present

## 2018-10-18 DIAGNOSIS — Z888 Allergy status to other drugs, medicaments and biological substances status: Secondary | ICD-10-CM | POA: Diagnosis not present

## 2018-10-18 DIAGNOSIS — I272 Pulmonary hypertension, unspecified: Secondary | ICD-10-CM | POA: Diagnosis not present

## 2018-10-18 DIAGNOSIS — Z7951 Long term (current) use of inhaled steroids: Secondary | ICD-10-CM

## 2018-10-18 DIAGNOSIS — I351 Nonrheumatic aortic (valve) insufficiency: Secondary | ICD-10-CM | POA: Diagnosis present

## 2018-10-18 DIAGNOSIS — E785 Hyperlipidemia, unspecified: Secondary | ICD-10-CM | POA: Diagnosis present

## 2018-10-18 DIAGNOSIS — I371 Nonrheumatic pulmonary valve insufficiency: Secondary | ICD-10-CM

## 2018-10-18 DIAGNOSIS — Z820 Family history of epilepsy and other diseases of the nervous system: Secondary | ICD-10-CM

## 2018-10-18 DIAGNOSIS — R0602 Shortness of breath: Secondary | ICD-10-CM | POA: Diagnosis not present

## 2018-10-18 DIAGNOSIS — M1711 Unilateral primary osteoarthritis, right knee: Secondary | ICD-10-CM | POA: Diagnosis not present

## 2018-10-18 DIAGNOSIS — Z885 Allergy status to narcotic agent status: Secondary | ICD-10-CM

## 2018-10-18 DIAGNOSIS — Z8249 Family history of ischemic heart disease and other diseases of the circulatory system: Secondary | ICD-10-CM

## 2018-10-18 DIAGNOSIS — R410 Disorientation, unspecified: Secondary | ICD-10-CM | POA: Diagnosis not present

## 2018-10-18 DIAGNOSIS — R739 Hyperglycemia, unspecified: Secondary | ICD-10-CM | POA: Diagnosis present

## 2018-10-18 DIAGNOSIS — K219 Gastro-esophageal reflux disease without esophagitis: Secondary | ICD-10-CM | POA: Diagnosis present

## 2018-10-18 DIAGNOSIS — G8324 Monoplegia of upper limb affecting left nondominant side: Secondary | ICD-10-CM | POA: Diagnosis not present

## 2018-10-18 DIAGNOSIS — R29700 NIHSS score 0: Secondary | ICD-10-CM | POA: Diagnosis not present

## 2018-10-18 DIAGNOSIS — I340 Nonrheumatic mitral (valve) insufficiency: Secondary | ICD-10-CM | POA: Diagnosis not present

## 2018-10-18 DIAGNOSIS — R29898 Other symptoms and signs involving the musculoskeletal system: Secondary | ICD-10-CM

## 2018-10-18 DIAGNOSIS — I6523 Occlusion and stenosis of bilateral carotid arteries: Secondary | ICD-10-CM | POA: Diagnosis not present

## 2018-10-18 DIAGNOSIS — I10 Essential (primary) hypertension: Secondary | ICD-10-CM | POA: Diagnosis present

## 2018-10-18 DIAGNOSIS — M6281 Muscle weakness (generalized): Secondary | ICD-10-CM | POA: Diagnosis not present

## 2018-10-18 DIAGNOSIS — R000 Tachycardia, unspecified: Secondary | ICD-10-CM | POA: Diagnosis not present

## 2018-10-18 DIAGNOSIS — I639 Cerebral infarction, unspecified: Principal | ICD-10-CM | POA: Diagnosis present

## 2018-10-18 DIAGNOSIS — I6930 Unspecified sequelae of cerebral infarction: Secondary | ICD-10-CM | POA: Diagnosis present

## 2018-10-18 DIAGNOSIS — Z8673 Personal history of transient ischemic attack (TIA), and cerebral infarction without residual deficits: Secondary | ICD-10-CM | POA: Diagnosis present

## 2018-10-18 DIAGNOSIS — F411 Generalized anxiety disorder: Secondary | ICD-10-CM | POA: Diagnosis present

## 2018-10-18 DIAGNOSIS — R0689 Other abnormalities of breathing: Secondary | ICD-10-CM | POA: Diagnosis not present

## 2018-10-18 DIAGNOSIS — Z8349 Family history of other endocrine, nutritional and metabolic diseases: Secondary | ICD-10-CM | POA: Diagnosis not present

## 2018-10-18 DIAGNOSIS — I63532 Cerebral infarction due to unspecified occlusion or stenosis of left posterior cerebral artery: Secondary | ICD-10-CM | POA: Diagnosis not present

## 2018-10-18 DIAGNOSIS — F419 Anxiety disorder, unspecified: Secondary | ICD-10-CM | POA: Diagnosis present

## 2018-10-18 DIAGNOSIS — Z882 Allergy status to sulfonamides status: Secondary | ICD-10-CM | POA: Diagnosis not present

## 2018-10-18 DIAGNOSIS — I69952 Hemiplegia and hemiparesis following unspecified cerebrovascular disease affecting left dominant side: Secondary | ICD-10-CM | POA: Diagnosis not present

## 2018-10-18 LAB — CBC
HEMATOCRIT: 40.9 % (ref 36.0–46.0)
HEMOGLOBIN: 13.5 g/dL (ref 12.0–15.0)
MCH: 30.1 pg (ref 26.0–34.0)
MCHC: 33 g/dL (ref 30.0–36.0)
MCV: 91.3 fL (ref 80.0–100.0)
Platelets: 332 10*3/uL (ref 150–400)
RBC: 4.48 MIL/uL (ref 3.87–5.11)
RDW: 11.9 % (ref 11.5–15.5)
WBC: 11.9 10*3/uL — ABNORMAL HIGH (ref 4.0–10.5)
nRBC: 0 % (ref 0.0–0.2)

## 2018-10-18 LAB — COMPREHENSIVE METABOLIC PANEL
ALBUMIN: 3.5 g/dL (ref 3.5–5.0)
ALT: 14 U/L (ref 0–44)
AST: 20 U/L (ref 15–41)
Alkaline Phosphatase: 65 U/L (ref 38–126)
Anion gap: 9 (ref 5–15)
BILIRUBIN TOTAL: 0.3 mg/dL (ref 0.3–1.2)
BUN: 13 mg/dL (ref 8–23)
CALCIUM: 9.5 mg/dL (ref 8.9–10.3)
CO2: 24 mmol/L (ref 22–32)
Chloride: 107 mmol/L (ref 98–111)
Creatinine, Ser: 0.55 mg/dL (ref 0.44–1.00)
GFR calc Af Amer: >60 mL/min (ref >60)
GFR calc non Af Amer: >60 mL/min (ref >60)
Glucose, Bld: 134 mg/dL — ABNORMAL HIGH (ref 70–99)
Potassium: 3.5 mmol/L (ref 3.5–5.1)
Sodium: 140 mmol/L (ref 135–145)
TOTAL PROTEIN: 6.9 g/dL (ref 6.5–8.1)

## 2018-10-18 LAB — LIPID PANEL
Cholesterol: 231 mg/dL — ABNORMAL HIGH (ref 0–200)
HDL: 65 mg/dL (ref >40)
LDL Cholesterol: 139 mg/dL — ABNORMAL HIGH (ref 0–99)
Total CHOL/HDL Ratio: 3.6 RATIO
Triglycerides: 137 mg/dL (ref <150)
VLDL: 27 mg/dL (ref 0–40)

## 2018-10-18 LAB — RAPID URINE DRUG SCREEN, HOSP PERFORMED
Amphetamines: NOT DETECTED
Barbiturates: NOT DETECTED
Benzodiazepines: POSITIVE — AB
Cocaine: NOT DETECTED
Opiates: NOT DETECTED
Tetrahydrocannabinol: NOT DETECTED

## 2018-10-18 LAB — URINALYSIS, ROUTINE W REFLEX MICROSCOPIC
Bilirubin Urine: NEGATIVE
Glucose, UA: NEGATIVE mg/dL
HGB URINE DIPSTICK: NEGATIVE
Ketones, ur: NEGATIVE mg/dL
Leukocytes,Ua: NEGATIVE
Nitrite: NEGATIVE
Protein, ur: NEGATIVE mg/dL
SPECIFIC GRAVITY, URINE: 1.004 — AB (ref 1.005–1.030)
pH: 7 (ref 5.0–8.0)

## 2018-10-18 LAB — DIFFERENTIAL
Abs Immature Granulocytes: 0.05 10*3/uL (ref 0.00–0.07)
Basophils Absolute: 0 10*3/uL (ref 0.0–0.1)
Basophils Relative: 0 %
Eosinophils Absolute: 0 10*3/uL (ref 0.0–0.5)
Eosinophils Relative: 0 %
Immature Granulocytes: 0 %
LYMPHS PCT: 10 %
Lymphs Abs: 1.2 10*3/uL (ref 0.7–4.0)
Monocytes Absolute: 0.8 10*3/uL (ref 0.1–1.0)
Monocytes Relative: 7 %
Neutro Abs: 9.8 10*3/uL — ABNORMAL HIGH (ref 1.7–7.7)
Neutrophils Relative %: 83 %

## 2018-10-18 LAB — ECHOCARDIOGRAM COMPLETE
Height: 65 in
Weight: 2240 oz

## 2018-10-18 LAB — PHOSPHORUS: Phosphorus: 2.4 mg/dL — ABNORMAL LOW (ref 2.5–4.6)

## 2018-10-18 LAB — MAGNESIUM: Magnesium: 1.9 mg/dL (ref 1.7–2.4)

## 2018-10-18 LAB — SEDIMENTATION RATE: Sed Rate: 10 mm/hr (ref 0–22)

## 2018-10-18 LAB — PROTIME-INR
INR: 0.92
Prothrombin Time: 12.3 seconds (ref 11.4–15.2)

## 2018-10-18 LAB — APTT: aPTT: 29 seconds (ref 24–36)

## 2018-10-18 LAB — HEMOGLOBIN A1C
Hgb A1c MFr Bld: 5.8 % — ABNORMAL HIGH (ref 4.8–5.6)
MEAN PLASMA GLUCOSE: 119.76 mg/dL

## 2018-10-18 LAB — ETHANOL: Alcohol, Ethyl (B): <10 mg/dL (ref <10)

## 2018-10-18 MED ORDER — HYDROCHLOROTHIAZIDE 25 MG PO TABS: 25.0000 mg | ORAL_TABLET | Freq: Every day | ORAL | Status: DC

## 2018-10-18 MED ORDER — ATORVASTATIN CALCIUM 40 MG PO TABS
40.0000 mg | ORAL_TABLET | Freq: Every day | ORAL | Status: DC
  Administered 2018-10-18: 40 mg via ORAL
  Filled 2018-10-18: qty 1

## 2018-10-18 MED ORDER — CLOPIDOGREL BISULFATE 75 MG PO TABS
75.0000 mg | ORAL_TABLET | Freq: Every day | ORAL | Status: DC
  Administered 2018-10-18 – 2018-10-19 (×2): 75 mg via ORAL
  Filled 2018-10-18 (×3): qty 1

## 2018-10-18 MED ORDER — ENOXAPARIN SODIUM 40 MG/0.4ML ~~LOC~~ SOLN: 40.0000 mg | SUBCUTANEOUS | Status: DC

## 2018-10-18 MED ORDER — ESTRADIOL 1 MG PO TABS
1.0000 mg | ORAL_TABLET | Freq: Every day | ORAL | Status: DC
  Administered 2018-10-18: 1 mg via ORAL
  Filled 2018-10-18: qty 1

## 2018-10-18 MED ORDER — ENOXAPARIN SODIUM 40 MG/0.4ML ~~LOC~~ SOLN
40.0000 mg | SUBCUTANEOUS | Status: DC
  Administered 2018-10-18 – 2018-10-19 (×2): 40 mg via SUBCUTANEOUS
  Filled 2018-10-18 (×2): qty 0.4

## 2018-10-18 MED ORDER — ASPIRIN 300 MG RE SUPP: 300.0000 mg | Freq: Every day | RECTAL | Status: DC

## 2018-10-18 MED ORDER — K PHOS MONO-SOD PHOS DI & MONO 155-852-130 MG PO TABS
500.0000 mg | ORAL_TABLET | Freq: Three times a day (TID) | ORAL | Status: AC
  Administered 2018-10-18 (×3): 500 mg via ORAL
  Filled 2018-10-18 (×3): qty 2

## 2018-10-18 MED ORDER — ASPIRIN 325 MG PO TABS
325.0000 mg | ORAL_TABLET | Freq: Every day | ORAL | Status: DC
  Administered 2018-10-18 – 2018-10-19 (×2): 325 mg via ORAL
  Filled 2018-10-18 (×2): qty 1

## 2018-10-18 MED ORDER — DIAZEPAM 2 MG PO TABS
2.0000 mg | ORAL_TABLET | Freq: Three times a day (TID) | ORAL | Status: DC | PRN
  Administered 2018-10-18 (×2): 2 mg via ORAL
  Filled 2018-10-18 (×2): qty 1

## 2018-10-18 MED ORDER — FLUTICASONE PROPIONATE 50 MCG/ACT NA SUSP: 1.0000 | Freq: Every day | NASAL | Status: DC | PRN

## 2018-10-18 MED ORDER — ACETAMINOPHEN 650 MG RE SUPP: 650.0000 mg | RECTAL | Status: DC | PRN

## 2018-10-18 MED ORDER — ACETAMINOPHEN 160 MG/5ML PO SOLN: 650.0000 mg | ORAL | Status: DC | PRN

## 2018-10-18 MED ORDER — DIAZEPAM 2 MG PO TABS: 1.0000 mg | ORAL_TABLET | ORAL | Status: DC | PRN

## 2018-10-18 MED ORDER — DIAZEPAM 2 MG PO TABS
2.0000 mg | ORAL_TABLET | Freq: Three times a day (TID) | ORAL | Status: DC
  Administered 2018-10-18: 2 mg via ORAL
  Filled 2018-10-18: qty 1

## 2018-10-18 MED ORDER — STROKE: EARLY STAGES OF RECOVERY BOOK
Freq: Once | Status: AC
  Administered 2018-10-18: 06:00:00
  Filled 2018-10-18 (×2): qty 1

## 2018-10-18 MED ORDER — PANTOPRAZOLE SODIUM 40 MG PO TBEC
40.0000 mg | DELAYED_RELEASE_TABLET | Freq: Every day | ORAL | Status: DC
  Administered 2018-10-18 – 2018-10-19 (×2): 40 mg via ORAL
  Filled 2018-10-18 (×2): qty 1

## 2018-10-18 MED ORDER — ACETAMINOPHEN 325 MG PO TABS: 650.0000 mg | ORAL_TABLET | ORAL | Status: DC | PRN

## 2018-10-18 MED ORDER — LOSARTAN POTASSIUM 50 MG PO TABS: 100.0000 mg | ORAL_TABLET | Freq: Every day | ORAL | Status: DC

## 2018-10-18 MED ORDER — OMEGA-3-ACID ETHYL ESTERS 1 G PO CAPS
3.0000 | ORAL_CAPSULE | Freq: Every day | ORAL | Status: DC
  Administered 2018-10-18 – 2018-10-19 (×2): 3 g via ORAL
  Filled 2018-10-18 (×3): qty 3

## 2018-10-18 NOTE — Progress Notes (Signed)
Patient off floor for MRI @0830AM , unable to do  Neuro checks at that time.Christina Silva

## 2018-10-18 NOTE — Care Management Obs Status (Signed)
[facility] NOTIFICATION   Patient Details  Name: Christina Silva MRN: [id number] Date of Birth: 06-Apr-1933   Medicare Observation Status Notification Given:  Yes    Tommy Medal 10/18/2018, 11:06 AM

## 2018-10-18 NOTE — Progress Notes (Addendum)
COVERAGE NOTE   10/18/2018 12:42 PM  Christina Silva was seen and examined.  The H&P by the admitting provider, orders, imaging was reviewed.  Please see new orders.  Will continue to follow.  MRI reviewed, positive for acute CVA. See orders. Added atorvastatin.  Neuro consult requested.  Echo pending.  Allow permissive hypertension.  Hold losartan temporarily.  Hold estradiol for now.    Vitals:   10/18/18 0904 10/18/18 1104  BP: (!) 155/67 (!) 183/75  Pulse: 72 90  Resp: 17 17  Temp: (!) 97.3 F (36.3 C) 97.8 F (36.6 C)  SpO2: 95% 95%    Results for orders placed or performed during the hospital encounter of 10/18/18  Ethanol  Result Value Ref Range   Alcohol, Ethyl (B) <10 <10 mg/dL  Protime-INR  Result Value Ref Range   Prothrombin Time 12.3 11.4 - 15.2 seconds   INR 0.92   APTT  Result Value Ref Range   aPTT 29 24 - 36 seconds  CBC  Result Value Ref Range   WBC 11.9 (H) 4.0 - 10.5 K/uL   RBC 4.48 3.87 - 5.11 MIL/uL   Hemoglobin 13.5 12.0 - 15.0 g/dL   HCT 40.9 36.0 - 46.0 %   MCV 91.3 80.0 - 100.0 fL   MCH 30.1 26.0 - 34.0 pg   MCHC 33.0 30.0 - 36.0 g/dL   RDW 11.9 11.5 - 15.5 %   Platelets 332 150 - 400 K/uL   nRBC 0.0 0.0 - 0.2 %  Differential  Result Value Ref Range   Neutrophils Relative % 83 %   Neutro Abs 9.8 (H) 1.7 - 7.7 K/uL   Lymphocytes Relative 10 %   Lymphs Abs 1.2 0.7 - 4.0 K/uL   Monocytes Relative 7 %   Monocytes Absolute 0.8 0.1 - 1.0 K/uL   Eosinophils Relative 0 %   Eosinophils Absolute 0.0 0.0 - 0.5 K/uL   Basophils Relative 0 %   Basophils Absolute 0.0 0.0 - 0.1 K/uL   Immature Granulocytes 0 %   Abs Immature Granulocytes 0.05 0.00 - 0.07 K/uL  Comprehensive metabolic panel  Result Value Ref Range   Sodium 140 135 - 145 mmol/L   Potassium 3.5 3.5 - 5.1 mmol/L   Chloride 107 98 - 111 mmol/L   CO2 24 22 - 32 mmol/L   Glucose, Bld 134 (H) 70 - 99 mg/dL   BUN 13 8 - 23 mg/dL   Creatinine, Ser 0.55 0.44 - 1.00 mg/dL   Calcium 9.5  8.9 - 10.3 mg/dL   Total Protein 6.9 6.5 - 8.1 g/dL   Albumin 3.5 3.5 - 5.0 g/dL   AST 20 15 - 41 U/L   ALT 14 0 - 44 U/L   Alkaline Phosphatase 65 38 - 126 U/L   Total Bilirubin 0.3 0.3 - 1.2 mg/dL   GFR calc non Af Amer >60 >60 mL/min   GFR calc Af Amer >60 >60 mL/min   Anion gap 9 5 - 15  Magnesium  Result Value Ref Range   Magnesium 1.9 1.7 - 2.4 mg/dL  Phosphorus  Result Value Ref Range   Phosphorus 2.4 (L) 2.5 - 4.6 mg/dL  Urinalysis, Routine w reflex microscopic  Result Value Ref Range   Color, Urine STRAW (A) YELLOW   APPearance CLEAR CLEAR   Specific Gravity, Urine 1.004 (L) 1.005 - 1.030   pH 7.0 5.0 - 8.0   Glucose, UA NEGATIVE NEGATIVE mg/dL   Hgb urine dipstick NEGATIVE NEGATIVE  Bilirubin Urine NEGATIVE NEGATIVE   Ketones, ur NEGATIVE NEGATIVE mg/dL   Protein, ur NEGATIVE NEGATIVE mg/dL   Nitrite NEGATIVE NEGATIVE   Leukocytes,Ua NEGATIVE NEGATIVE  Urine rapid drug screen (hosp performed)  Result Value Ref Range   Opiates NONE DETECTED NONE DETECTED   Cocaine NONE DETECTED NONE DETECTED   Benzodiazepines POSITIVE (A) NONE DETECTED   Amphetamines NONE DETECTED NONE DETECTED   Tetrahydrocannabinol NONE DETECTED NONE DETECTED   Barbiturates NONE DETECTED NONE DETECTED  Lipid panel  Result Value Ref Range   Cholesterol 231 (H) 0 - 200 mg/dL   Triglycerides 137 <150 mg/dL   HDL 65 >40 mg/dL   Total CHOL/HDL Ratio 3.6 RATIO   VLDL 27 0 - 40 mg/dL   LDL Cholesterol 139 (H) 0 - 99 mg/dL  ECHOCARDIOGRAM COMPLETE  Result Value Ref Range   Weight 2,240 oz   Height 65 in   BP 155/67 mmHg     Murvin Natal, MD [facility]   10/18/2018  1:19 AM How to contact the [facility] Attending or Consulting provider 7A - 7P or covering provider during after hours 7P -7A, for this patient?  1. Check the care team in [facility] and look for a) attending/consulting [facility] provider listed and b) the [facility] team listed 2. Log into [email or web address] and use [facility]'s universal password to  access. If you do not have the password, please contact the hospital operator. 3. Locate the [facility] provider you are looking for under [facility] and page to a number that you can be directly reached. 4. If you still have difficulty reaching the provider, please page the [facility] (Director on Call) for the Hospitalists listed on amion for assistance.

## 2018-10-18 NOTE — H&P (Signed)
History and Physical    Christina Silva DOB: Jun 22, 1933 DOA: 10/18/2018  PCP: [name], FNP   Patient coming from: Home.  I have personally briefly reviewed patient's old medical records in [place]  Chief Complaint: Left arm wetness.  HPI: Christina Silva is a 85 y.o. female with medical history significant of anxiety, cataracts, GERD, hyperlipidemia, hypertension, vertigo who is coming to the emergency department with complaints of left upper extremity weakness since yesterday morning.  She states that she was having trouble turning the pages of a book.she denies blurred vision, slurred speech, language comprehension inability, left lower extremity involvement, dizziness, but states that I her gait was a bit unsteady. She states that she has also been having palpitation and feeling dyspneic at times.  She mentions that this past weekend her BP was elevated and she has some vision changes (she was seeing small bright glittering spots).  Subsequently, EMS was called early in the afternoon, but the patient declined to come to the hospital at that time as her symptoms were getting better.  Yesterday evening, the patient went to sleep, but woke up with what she thinks was an anxiety attack.  She was dyspneic, her blood pressure was elevated and she was having palpitations.  She denies fever, chills, rhinorrhea, sore throat, wheezing, hemoptysis, chest pain, PND or orthopnea.  She recently had lower extremity edema, but used hydrochlorothiazide for a few days on an as-needed basis and edema has resolved.  She denies abdominal pain, nausea, emesis, diarrhea, constipation, melena or hematochezia.  No dysuria, frequency or hematuria.  Denies skin pruritus.  No polyuria, polydipsia, polyphagia or blurred vision.  Patient states that she has bad arthritis of her right knee and had an intra-articular steroid injection a few days ago.  ED Course: Initial vital signs  temperature 98.2 F, pulse 86, respirations 16, blood pressure 157/86 mmHg and O2 sat 95% on room air.   Her white count is 11.9, hemoglobin 13.5 g/dL and platelets 332.  PT/INR/APTT were within normal limits.  CMP shows a glucose of 134 mg/dL, but is otherwise negative.  EtOH level was normal.  Magnesium was 1.9 and phosphorus 2.4 mg/dL.  Imaging: Chest radiograph had low volumes, but no active cardiopulmonary disease was seen.  CT head did not show any acute intracranial pathology.  Please see images and full radiology report for further detail.  Review of Systems: As per HPI otherwise 10 point review of systems negative.   Past Medical History:  Diagnosis Date  . Anxiety   . Cataract   . GERD (gastroesophageal reflux disease)   . Hyperlipidemia   . Hypertension   . Vertigo     Past Surgical History:  Procedure Laterality Date  . ABDOMINAL HYSTERECTOMY  1980  . CATARACT EXTRACTION Right   . EYE SURGERY    . ROTATOR CUFF REPAIR Left      reports that she quit smoking about 35 years ago. Her smoking use included cigarettes. She has never used smokeless tobacco. She reports that she does not drink alcohol or use drugs.  Allergies  Allergen Reactions  . Aspirin Other (See Comments)    caused bursting blood vessels on ankles per dermatologist 40 years ago - pt is now taking BC powders occasionally for headaches with no more problems. 03/14/15  . Codeine Nausea And Vomiting  . Lisinopril Swelling  . Morphine Nausea And Vomiting  . Sulfonamide Derivatives Rash    Family History  Problem Relation Age  of Onset  . Heart attack Mother   . Hyperlipidemia Mother   . Transient ischemic attack Father   . Psychiatric Illness Sister   . Epilepsy Brother    Prior to Admission medications   Medication Sig Start Date End Date Taking? Authorizing Provider  diazepam (VALIUM) 2 MG tablet TAKE 1 TABLET EVERY 8 HOURS AS NEEDED FOR ANXIETY 10/10/18   Hassell Done, Mary-Margaret, FNP  estradiol  (ESTRACE) 1 MG tablet TAKE ONE (1) TABLET EACH DAY 08/16/18   Hassell Done, Mary-Margaret, FNP  fluticasone (FLONASE) 50 MCG/ACT nasal spray Place 1 spray into both nostrils daily. For allergies    [provider]  hydrochlorothiazide (HYDRODIURIL) 25 MG tablet Take 1 tablet (25 mg total) by mouth daily. 10/11/18   Terald Sleeper, PA-C  losartan (COZAAR) 100 MG tablet Take 1 tablet (100 mg total) by mouth daily. 08/16/18   Hassell Done Mary-Margaret, FNP  Omega-3 Fatty Acids (FISH OIL) 1200 MG CAPS Take 3 capsules (3,600 mg total) by mouth daily. 05/24/15   Cherre Robins, PharmD  omeprazole (PRILOSEC) 20 MG capsule TAKE ONE (1) CAPSULE EACH DAY 08/05/18   [name], FNP    Physical Exam: Vitals:   10/18/18 0148 10/18/18 0311 10/18/18 0319  BP: (!) 157/86 (!) 191/82   Pulse: 86  75  Resp: 16  (!) 22  Temp: 98.2 F (36.8 C)    TempSrc: Oral    SpO2: 95%  96%    Constitutional: NAD, calm, comfortable Eyes: PERRL, lids and conjunctivae normal ENMT: Mucous membranes are moist. Posterior pharynx clear of any exudate or lesions. Neck: Normal, supple, no masses, no thyromegaly Respiratory: Clear to auscultation bilaterally, no wheezing, no crackles. Normal respiratory effort. No accessory muscle use.  Cardiovascular: Regular rate and rhythm, no murmurs / rubs / gallops. No extremity edema. 2+ pedal pulses. No carotid bruits.  Abdomen: Soft, no tenderness, no masses palpated. No hepatosplenomegaly. Bowel sounds positive.  Musculoskeletal: no clubbing / cyanosis.  Mild right knee edema. Good ROM, no contractures. Normal muscle tone.  Skin: no rashes, lesions, ulcers. No induration Neurologic: CN 2-12 grossly intact. Sensation intact, DTR normal.  No pronator drift, but her LUE was a bit tremulous.  She had 4.5/5 LUE hemiparesis, otherwise neurological exam is unremarkable. Psychiatric: Normal judgment and insight. Alert and oriented x 4. Normal mood.   Labs on Admission: I have  personally reviewed following labs and imaging studies  CBC: Recent Labs  Lab 10/18/18 0130  WBC 11.9*  NEUTROABS 9.8*  HGB 13.5  HCT 40.9  MCV 91.3  PLT 626   Basic Metabolic Panel: Recent Labs  Lab 10/18/18 0130  NA 140  K 3.5  CL 107  CO2 24  GLUCOSE 134*  BUN 13  CREATININE 0.55  CALCIUM 9.5   GFR: CrCl cannot be calculated (Unknown ideal weight.). Liver Function Tests: Recent Labs  Lab 10/18/18 0130  AST 20  ALT 14  ALKPHOS 65  BILITOT 0.3  PROT 6.9  ALBUMIN 3.5   No results for input(s): LIPASE, AMYLASE in the last 168 hours. No results for input(s): AMMONIA in the last 168 hours. Coagulation Profile: Recent Labs  Lab 10/18/18 0130  INR 0.92   Cardiac Enzymes: No results for input(s): CKTOTAL, CKMB, CKMBINDEX, TROPONINI in the last 168 hours. BNP (last 3 results) No results for input(s): PROBNP in the last 8760 hours. HbA1C: No results for input(s): HGBA1C in the last 72 hours. CBG: No results for input(s): GLUCAP in the last 168 hours. Lipid Profile:  No results for input(s): CHOL, HDL, LDLCALC, TRIG, CHOLHDL, LDLDIRECT in the last 72 hours. Thyroid Function Tests: No results for input(s): TSH, T4TOTAL, FREET4, T3FREE, THYROIDAB in the last 72 hours. Anemia Panel: No results for input(s): VITAMINB12, FOLATE, FERRITIN, TIBC, IRON, RETICCTPCT in the last 72 hours. Urine analysis:    Component Value Date/Time   APPEARANCEUR Clear 09/26/2018 1154   GLUCOSEU Negative 09/26/2018 1154   BILIRUBINUR Negative 09/26/2018 1154   PROTEINUR Negative 09/26/2018 1154   NITRITE Negative 09/26/2018 1154   LEUKOCYTESUR Trace (A) 09/26/2018 1154    Radiological Exams on Admission: Dg Chest 2 View  Result Date: 10/18/2018 CLINICAL DATA:  Short of breath EXAM: CHEST - 2 VIEW COMPARISON:  11/26/2017 FINDINGS: No acute consolidation or effusion. Borderline heart size. Aortic atherosclerosis. No pneumothorax. IMPRESSION: No active cardiopulmonary disease.   Mildly low lung volume Electronically Signed   By: Donavan Foil M.D.   On: 10/18/2018 02:29   Ct Head Wo Contrast  Result Date: 10/18/2018 CLINICAL DATA:  Shortness of breath, left upper extremity weakness and confusion. EXAM: CT HEAD WITHOUT CONTRAST TECHNIQUE: Contiguous axial images were obtained from the base of the skull through the vertex without intravenous contrast. COMPARISON:  Head CT 11/26/2017 FINDINGS: Brain: There is no mass, hemorrhage or extra-axial collection. The size and configuration of the ventricles and extra-axial CSF spaces are normal. There is hypoattenuation of the white matter, most commonly indicating chronic small vessel disease. Vascular: Atherosclerotic calcification of the internal carotid arteries at the skull base. No abnormal hyperdensity of the major intracranial arteries or dural venous sinuses. Skull: The visualized skull base, calvarium and extracranial soft tissues are normal. Sinuses/Orbits: No fluid levels or advanced mucosal thickening of the visualized paranasal sinuses. No mastoid or middle ear effusion. The orbits are normal. IMPRESSION: 1. No acute intracranial abnormality. 2. Findings of chronic small vessel disease. Electronically Signed   By: Ulyses Jarred M.D.   On: 10/18/2018 02:26    EKG: Independently reviewed.  Vent. rate 77 BPM PR interval * ms QRS duration 95 ms QT/QTc 394/446 ms P-R-T axes 29 -21 4 Sinus rhythm Borderline left axis deviation Anterior infarct, old Borderline T abnormalities, inferior leads  Assessment/Plan Principal Problem:   Weakness of left upper extremity Observation/telemetry. Frequent neuro checks. Check swallow screen. PT/OT/SLP evaluation. Check fasting lipids and hemoglobin A1c. Check carotid Doppler and echocardiogram. Check MR/MRA to brain later today.  Active Problems:   GERD (gastroesophageal reflux disease) Protonix 40 mg p.o. daily.    Hyperlipidemia with target LDL less than 100 Continue  omega-3 fish oil. Check fasting lipids.    HTN (hypertension) Resume losartan 100 mg p.o. later tonight. Monitor BP, renal function electrolytes    Anxiety Continue diazepam 2 mg p.o. every 8 hours.    Hyperglycemia Check hemoglobin A1c.    DVT prophylaxis: Lovenox SQ. Code Status: Full code. Family Communication: Her daughter was present in her room. Disposition Plan: Observation for TIA monitoring and work-up. Consults called: Admission status: Observation/telemetry.   Reubin Milan MD [facility]  10/18/2018, 3:33 AM

## 2018-10-18 NOTE — Consult Note (Signed)
[place] A. Merlene Laughter, MD     [email or web address]          Christina Silva is an 85 y.o. female.   ASSESSMENT/PLAN:  Acute left-sided numbness and weakness with imaging showing infarct involving deep white matter region on the corresponding right side.  There is some suggestion of reduced flow involving the M2 segment of the MCA on the right side suggestive of intracranial disease.  This still may be a small vessel issue however.  I recommend aspirin Plavix combination for a month.  Subsequently, aspirin 325 can be continued and Plavix discontinued.  Agree with the addition of a statin.  Headache and insomnia both possibly related to related to anxiety but I will check a ESR and C-reactive protein.    The patient is a 85 year old white female who presents with acute onset of weakness involving the left upper extremity.  Symptoms were associated with numbness, dyspnea and shortness of breath.  She also has been having significant frontal headaches over the last few weeks.  She apparently has had some issues with insomnia, anxiety and panic attacks especially since her husband died last 01/20/23.  She lives alone and is highly functional.  She does not report having significant symptoms involving the left lower extremity.  She denies dysarthria.  She reports that her symptoms have improved since being admitted.  She is noted to have some mild weakness of the shoulders/deltoids bilaterally especially in the right side.  She said that she has had some rotator cuff issues.  The review of systems otherwise negative.  GENERAL: This very pleasant female in no acute distress.  HEENT: This is normal.  ABDOMEN: soft  EXTREMITIES: No edema   BACK: Normal  SKIN: Normal by inspection.    MENTAL STATUS: Alert and oriented. Speech, language and cognition are generally intact. Judgment and insight normal.   CRANIAL NERVES: Pupils are equal, round and reactive to light and  accomodation; extra ocular movements are full, there is no significant nystagmus; visual fields are full; upper and lower facial muscles are normal in strength and symmetric, there is no flattening of the nasolabial folds; tongue is midline; uvula is midline; shoulder elevation is normal.  MOTOR: There is mild weakness of the right deltoid graded as 4+/5.  Left is actually normal.  Bulk and tone are normal of the upper extremities.  There is no pronator drift or downward drift of the upper extremities.  There is no drift of the legs.  Leg shows normal tone, bulk and strength.  COORDINATION: Left finger to nose is normal, right finger to nose is normal, No rest tremor; no intention tremor; no postural tremor; no bradykinesia.  REFLEXES: Deep tendon reflexes are symmetrical and normal.    SENSATION: Normal to light touch, temperature, and pain.  No extinction on double simultaneous stimulation.    NIH stroke scale 0.     Blood pressure 130/70, pulse 85, temperature 98.3 F (36.8 C), temperature source Oral, resp. rate 20, height '5\' 5"'  (1.651 m), weight 63.5 kg, SpO2 95 %.  Past Medical History:  Diagnosis Date  . Anxiety   . Cataract   . GERD (gastroesophageal reflux disease)   . Hyperlipidemia   . Hypertension   . Vertigo     Past Surgical History:  Procedure Laterality Date  . ABDOMINAL HYSTERECTOMY  1980  . CATARACT EXTRACTION Right   . EYE SURGERY    . KNEE ARTHROSCOPY Left   . ROTATOR CUFF REPAIR Left   .  TONSILLECTOMY      Family History  Problem Relation Age of Onset  . Heart attack Mother   . Hyperlipidemia Mother   . Transient ischemic attack Father   . Psychiatric Illness Sister   . Epilepsy Brother     Social History:  reports that she quit smoking about 35 years ago. Her smoking use included cigarettes. She has never used smokeless tobacco. She reports that she does not drink alcohol or use drugs.  Allergies:  Allergies  Allergen Reactions  . Codeine  Nausea And Vomiting  . Lisinopril Swelling  . Morphine Nausea And Vomiting  . Sulfa Antibiotics Nausea Only and Rash  . Sulfonamide Derivatives Rash    Medications: Prior to Admission medications   Medication Sig Start Date End Date Taking? Authorizing Provider  diazepam (VALIUM) 2 MG tablet TAKE 1 TABLET EVERY 8 HOURS AS NEEDED FOR ANXIETY Patient taking differently: Take 2 mg by mouth daily as needed.  10/10/18  Yes Hassell Done, Mary-Margaret, FNP  estradiol (ESTRACE) 1 MG tablet TAKE ONE (1) TABLET EACH DAY Patient taking differently: Take 1 mg by mouth daily. TAKE ONE (1) TABLET EACH DAY 08/16/18  Yes Hassell Done, Mary-Margaret, FNP  fluticasone (FLONASE) 50 MCG/ACT nasal spray Place 1 spray into both nostrils daily as needed for allergies. For allergies    Yes [provider]  losartan (COZAAR) 100 MG tablet Take 1 tablet (100 mg total) by mouth daily. Patient taking differently: Take 100 mg by mouth at bedtime.  08/16/18  Yes Hassell Done, Mary-Margaret, FNP  Omega-3 Fatty Acids (FISH OIL) 1200 MG CAPS Take 3 capsules (3,600 mg total) by mouth daily. Patient taking differently: Take 2 capsules by mouth daily.  05/24/15  Yes Cherre Robins, PharmD  omeprazole (PRILOSEC) 20 MG capsule TAKE ONE (1) CAPSULE EACH DAY Patient taking differently: Take 20 mg by mouth daily. TAKE ONE (1) CAPSULE EACH DAY 08/05/18  Yes Hassell Done, Mary-Margaret, FNP  Polyethyl Glycol-Propyl Glycol (SYSTANE OP) Apply 1 drop to eye daily.   Yes [provider]    Scheduled Meds: . aspirin  300 mg Rectal Daily   Or  . aspirin  325 mg Oral Daily  . atorvastatin  40 mg Oral q1800  . enoxaparin (LOVENOX) injection  40 mg Subcutaneous Q24H  . omega-3 acid ethyl esters  3 capsule Oral Daily  . pantoprazole  40 mg Oral Daily  . phosphorus  500 mg Oral TID   Continuous Infusions: PRN Meds:.acetaminophen **OR** acetaminophen (TYLENOL) oral liquid 160 mg/5 mL **OR** acetaminophen, diazepam, fluticasone     Results  for orders placed or performed during the hospital encounter of 10/18/18 (from the past 48 hour(s))  Ethanol     Status: None   Collection Time: 10/18/18  1:30 AM  Result Value Ref Range   Alcohol, Ethyl (B) <10 <10 mg/dL    Comment: (NOTE) Lowest detectable limit for serum alcohol is 10 mg/dL. For medical purposes only. Performed at [facility], [address], [place], [place] [address]   Protime-INR     Status: None   Collection Time: 10/18/18  1:30 AM  Result Value Ref Range   Prothrombin Time 12.3 11.4 - 15.2 seconds   INR 0.92     Comment: Performed at [facility], [address], [place],  [address]  APTT     Status: None   Collection Time: 10/18/18  1:30 AM  Result Value Ref Range   aPTT 29 24 - 36 seconds    Comment: Performed at [facility], [address], [place], [place] [address]  CBC     Status: Abnormal   Collection Time: 10/18/18  1:30 AM  Result Value Ref Range   WBC 11.9 (H) 4.0 - 10.5 K/uL   RBC 4.48 3.87 - 5.11 MIL/uL   Hemoglobin 13.5 12.0 - 15.0 g/dL   HCT 40.9 36.0 - 46.0 %   MCV 91.3 80.0 - 100.0 fL   MCH 30.1 26.0 - 34.0 pg   MCHC 33.0 30.0 - 36.0 g/dL   RDW 11.9 11.5 - 15.5 %   Platelets 332 150 - 400 K/uL   nRBC 0.0 0.0 - 0.2 %    Comment: Performed at [facility], [address] [place], [place] [address]  Differential     Status: Abnormal   Collection Time: 10/18/18  1:30 AM  Result Value Ref Range   Neutrophils Relative % 83 %   Neutro Abs 9.8 (H) 1.7 - 7.7 K/uL   Lymphocytes Relative 10 %   Lymphs Abs 1.2 0.7 - 4.0 K/uL   Monocytes Relative 7 %   Monocytes Absolute 0.8 0.1 - 1.0 K/uL   Eosinophils Relative 0 %   Eosinophils Absolute 0.0 0.0 - 0.5 K/uL   Basophils Relative 0 %   Basophils Absolute 0.0 0.0 - 0.1 K/uL   Immature Granulocytes 0 %   Abs Immature Granulocytes 0.05 0.00 - 0.07 K/uL    Comment: Performed at [facility], [address], [place], [place] [address]  Comprehensive metabolic panel     Status: Abnormal    Collection Time: 10/18/18  1:30 AM  Result Value Ref Range   Sodium 140 135 - 145 mmol/L   Potassium 3.5 3.5 - 5.1 mmol/L   Chloride 107 98 - 111 mmol/L   CO2 24 22 - 32 mmol/L   Glucose, Bld 134 (H) 70 - 99 mg/dL   BUN 13 8 - 23 mg/dL   Creatinine, Ser 0.55 0.44 - 1.00 mg/dL   Calcium 9.5 8.9 - 10.3 mg/dL   Total Protein 6.9 6.5 - 8.1 g/dL   Albumin 3.5 3.5 - 5.0 g/dL   AST 20 15 - 41 U/L   ALT 14 0 - 44 U/L   Alkaline Phosphatase 65 38 - 126 U/L   Total Bilirubin 0.3 0.3 - 1.2 mg/dL   GFR calc non Af Amer >60 >60 mL/min   GFR calc Af Amer >60 >60 mL/min   Anion gap 9 5 - 15    Comment: Performed at [facility], [address] [place], [place] [address]  Magnesium     Status: None   Collection Time: 10/18/18  1:30 AM  Result Value Ref Range   Magnesium 1.9 1.7 - 2.4 mg/dL    Comment: Performed at [facility], [address] [place], [place] [address]  Phosphorus     Status: Abnormal   Collection Time: 10/18/18  1:30 AM  Result Value Ref Range   Phosphorus 2.4 (L) 2.5 - 4.6 mg/dL    Comment: Performed at [facility], [address] [place], [place] [address]  Hemoglobin A1c     Status: Abnormal   Collection Time: 10/18/18  1:30 AM  Result Value Ref Range   Hgb A1c MFr Bld 5.8 (H) 4.8 - 5.6 %    Comment: (NOTE) Pre diabetes:          5.7%-6.4% Diabetes:              >6.4% Glycemic control for   <7.0% adults  with diabetes    Mean Plasma Glucose 119.76 mg/dL    Comment: Performed at [facility] [address], [place], [place] [address]  Urinalysis, Routine w reflex microscopic     Status: Abnormal   Collection Time: 10/18/18  4:08 AM  Result Value Ref Range   Color, Urine STRAW (A) YELLOW   APPearance CLEAR CLEAR   Specific Gravity, Urine 1.004 (L) 1.005 - 1.030   pH 7.0 5.0 - 8.0   Glucose, UA NEGATIVE NEGATIVE mg/dL   Hgb urine dipstick NEGATIVE NEGATIVE   Bilirubin Urine NEGATIVE NEGATIVE   Ketones, ur NEGATIVE NEGATIVE mg/dL   Protein, ur NEGATIVE  NEGATIVE mg/dL   Nitrite NEGATIVE NEGATIVE   Leukocytes,Ua NEGATIVE NEGATIVE    Comment: Performed at [facility], [address], [place], [place] [address]  Urine rapid drug screen (hosp performed)     Status: Abnormal   Collection Time: 10/18/18  4:08 AM  Result Value Ref Range   Opiates NONE DETECTED NONE DETECTED   Cocaine NONE DETECTED NONE DETECTED   Benzodiazepines POSITIVE (A) NONE DETECTED   Amphetamines NONE DETECTED NONE DETECTED   Tetrahydrocannabinol NONE DETECTED NONE DETECTED   Barbiturates NONE DETECTED NONE DETECTED    Comment: (NOTE) DRUG SCREEN FOR MEDICAL PURPOSES ONLY.  IF CONFIRMATION IS NEEDED FOR ANY PURPOSE, NOTIFY LAB WITHIN 5 DAYS. LOWEST DETECTABLE LIMITS FOR URINE DRUG SCREEN Drug Class                     Cutoff (ng/mL) Amphetamine and metabolites    1000 Barbiturate and metabolites    200 Benzodiazepine                 063 Tricyclics and metabolites     300 Opiates and metabolites        300 Cocaine and metabolites        300 THC                            50 Performed at [facility], [address], [place], [place] [address]   Lipid panel     Status: Abnormal   Collection Time: 10/18/18  6:25 AM  Result Value Ref Range   Cholesterol 231 (H) 0 - 200 mg/dL   Triglycerides 137 <150 mg/dL   HDL 65 >40 mg/dL   Total CHOL/HDL Ratio 3.6 RATIO   VLDL 27 0 - 40 mg/dL   LDL Cholesterol 139 (H) 0 - 99 mg/dL    Comment:        Total Cholesterol/HDL:CHD Risk Coronary Heart Disease Risk Table                     Men   Women  1/2 Average Risk   3.4   3.3  Average Risk       5.0   4.4  2 X Average Risk   9.6   7.1  3 X Average Risk  23.4   11.0        Use the calculated Patient Ratio above and the CHD Risk Table to determine the patient's CHD Risk.        ATP III CLASSIFICATION (LDL):  <100     mg/dL   Optimal  100-129  mg/dL   Near or Above                    Optimal  130-159  mg/dL  Borderline  160-189  mg/dL   High  >190     mg/dL   Very  High Performed at [facility], [address] [place], [place] [address]     Studies/Results:  CAROTID FINE   BRAIN MRI MRA FINDINGS: Brain: Diffusion-weighted images demonstrates an acute nonhemorrhagic infarct involving the right lentiform nucleus and centrum semi ovale. No associated hemorrhage is present. There is no significant mass effect.  Moderate periventricular and scattered subcortical T2 signal changes are present bilaterally. The ventricles are of proportionate to the degree of atrophy. No significant extraaxial fluid collection is present.  Vascular: Flow is present in the major intracranial arteries.  Skull and upper cervical spine: The craniocervical junction is normal. Upper cervical spine is within normal limits. Marrow signal is unremarkable.  Sinuses/Orbits: The paranasal sinuses and mastoid air cells are clear. Bilateral lens replacements are noted. Globes and orbits are otherwise unremarkable.  IMPRESSION: 1. Acute nonhemorrhagic infarct measuring up to 2 cm from the right lentiform nucleus into the right centrum semi ovale. 2. Moderate atrophy and white matter disease otherwise likely reflects the sequela of chronic microvascular ischemia.       ECHO   1. The left ventricle has normal systolic function with an ejection fraction of 60-65%. The cavity size was normal. There is basal septal hypertophy. Left ventricular diastolic Doppler parameters are consistent with impaired relaxation.  2. The right ventricle has normal systolic function. The cavity was normal. There is no increase in right ventricular wall thickness.  3. The aortic valve is tricuspid Moderate thickening of the aortic valve Moderate calcification of the aortic valve. Aortic valve regurgitation is mild by color flow Doppler.. Moderate aortic annular calcification noted.  4. The mitral valve is normal in structure. Mild thickening of the mitral valve leaflet. Mild  calcification of the mitral valve leaflet. There is mild mitral annular calcification present. No evidence of mitral valve stenosis.  5. The tricuspid valve is normal in structure.  6. The pulmonic valve was grossly normal. Pulmonic valve regurgitation is mild by color flow Doppler.  7. The aortic root is normal in size and structure.  8. Pulmonary hypertension is indeterminat,inadequate TR jet.    The brain MRI and MRA are reviewed in person.  There is acute infarct seen on DWI involving the globus pallidus on the right side extending to the centrum semiovale abutting the lateral ventricle.  This is associated with reduced signal on the ADC scan.  There is moderate global atrophy.  There is moderate deep white matter and periventricular leukoencephalopathy.  No hemorrhages appreciated.  Intracranial vessels look mostly unremarkable.  There is slight reduction in caliber of the M2 segment of the MCA on the right side.    Gemma Ruan A. Merlene Laughter, M.D.  Diplomate, Tax adviser of Psychiatry and Neurology ( Neurology). 10/18/2018, 5:03 PM

## 2018-10-18 NOTE — Progress Notes (Signed)
*  PRELIMINARY RESULTS* Echocardiogram 2D Echocardiogram has been performed.  Leavy Cella 10/18/2018, 11:29 AM

## 2018-10-18 NOTE — Discharge Instructions (Signed)
PLEASE TAKE ASPIRIN 325 MG AND PLAVIX 75 MG DAILY FOR 30 DAYS, THEN STOP PLAVIX BUT CONTINUE TAKING THE ASPIRIN DAILY.     IMPORTANT INFORMATION: PAY CLOSE ATTENTION   PHYSICIAN DISCHARGE INSTRUCTIONS  Follow with Primary care provider  Hassell Done Mary-Margaret, FNP  and other consultants as instructed your Hospitalist Physician  SEEK MEDICAL CARE OR RETURN TO EMERGENCY ROOM IF SYMPTOMS COME BACK, WORSEN OR NEW PROBLEM DEVELOPS.   Please note: You were cared for by a hospitalist during your hospital stay. Every effort will be made to forward records to your primary care provider.  You can request that your primary care provider send for your hospital records if they have not received them.  Once you are discharged, your primary care physician will handle any further medical issues. Please note that NO REFILLS for any discharge medications will be authorized once you are discharged, as it is imperative that you return to your primary care physician (or establish a relationship with a primary care physician if you do not have one) for your post hospital discharge needs so that they can reassess your need for medications and monitor your lab values.  Please get a complete blood count and chemistry panel checked by your Primary MD at your next visit, and again as instructed by your Primary MD.  Get Medicines reviewed and adjusted: Please take all your medications with you for your next visit with your Primary MD  Laboratory/radiological data: Please request your Primary MD to go over all hospital tests and procedure/radiological results at the follow up, please ask your primary care provider to get all Hospital records sent to his/her office.  In some cases, they will be blood work, cultures and biopsy results pending at the time of your discharge. Please request that your primary care provider follow up on these results.  If you are diabetic, please bring your blood sugar readings with you to your  follow up appointment with primary care.    Please call and make your follow up appointments as soon as possible.    Also Note the following: If you experience worsening of your admission symptoms, develop shortness of breath, life threatening emergency, suicidal or homicidal thoughts you must seek medical attention immediately by calling 911 or calling your MD immediately  if symptoms less severe.  You must read complete instructions/literature along with all the possible adverse reactions/side effects for all the Medicines you take and that have been prescribed to you. Take any new Medicines after you have completely understood and accpet all the possible adverse reactions/side effects.   Do not drive when taking Pain medications or sleeping medications (Benzodiazepines)  Do not take more than prescribed Pain, Sleep and Anxiety Medications. It is not advisable to combine anxiety,sleep and pain medications without talking with your primary care practitioner  Special Instructions: If you have smoked or chewed Tobacco  in the last 2 yrs please stop smoking, stop any regular Alcohol  and or any Recreational drug use.  Wear Seat belts while driving.

## 2018-10-18 NOTE — Evaluation (Signed)
Physical Therapy Evaluation Patient Details Name: Christina Silva MRN: [id number] DOB: 11-11-1932 Today's Date: 10/18/2018   History of Present Illness  Christina Silva is a 85 y.o. female with medical history significant of anxiety, cataracts, GERD, hyperlipidemia, hypertension, vertigo who is coming to the emergency department with complaints of left upper extremity weakness since yesterday morning.  She states that she was having trouble turning the pages of a book.she denies blurred vision, slurred speech, language comprehension inability, left lower extremity involvement, dizziness, but states that I her gait was a bit unsteady. She states that she has also been having palpitation and feeling dyspneic at times.  She mentions that this past weekend her BP was elevated and she has some vision changes (she was seeing small bright glittering spots).  Subsequently, EMS was called early in the afternoon, but the patient declined to come to the hospital at that time as her symptoms were getting better.  Yesterday evening, the patient went to sleep, but woke up with what she thinks was an anxiety attack.  She was dyspneic, her blood pressure was elevated and she was having palpitations. MRI positive for acute infarct.     Clinical Impression  Patient functioning at baseline for functional mobility and gait.  Plan:  Patient discharged from physical therapy to care of nursing for ambulation daily as tolerated for length of stay.     Follow Up Recommendations No PT follow up    Equipment Recommendations  None recommended by PT    Recommendations for Other Services       Precautions / Restrictions Precautions Precautions: None Restrictions Weight Bearing Restrictions: No      Mobility  Bed Mobility Overal bed mobility: Independent                Transfers Overall transfer level: Modified independent Equipment used: None             General transfer comment: increased  time  Ambulation/Gait Ambulation/Gait assistance: Modified independent (Device/Increase time) Gait Distance (Feet): 200 Feet   Gait Pattern/deviations: WFL(Within Functional Limits) Gait velocity: slightly decreased   General Gait Details: no loss of balance on level, inclined, or declined surfaces   Stairs Stairs: Yes Stairs assistance: Modified independent (Device/Increase time) Stair Management: One rail Left;One rail Right;Step to pattern Number of Stairs: 8 General stair comments: demonstrates good return going up/down steps using 1 siderail with step to pattern, no loss of balance  Wheelchair Mobility    Modified Rankin (Stroke Patients Only)       Balance Overall balance assessment: No apparent balance deficits (not formally assessed)                                           Pertinent Vitals/Pain Pain Assessment: 0-10 Pain Score: 7  Pain Location: chronic right knee pain Pain Descriptors / Indicators: Aching Pain Intervention(s): Limited activity within patient's tolerance;Monitored during session    Home Living Family/patient expects to be discharged to:: Private residence Living Arrangements: Alone Available Help at Discharge: Family;Available PRN/intermittently Type of Home: House Home Access: Stairs to enter Entrance Stairs-Rails: Right Entrance Stairs-Number of Steps: 3 Home Layout: One level Home Equipment: Walker - 2 wheels;Shower seat      Prior Function Level of Independence: Independent         Comments: Independent in ADLs and functional mobility, community ambulator  Hand Dominance   Dominant Hand: Right    Extremity/Trunk Assessment   Upper Extremity Assessment Upper Extremity Assessment: Defer to OT evaluation    Lower Extremity Assessment Lower Extremity Assessment: Overall WFL for tasks assessed    Cervical / Trunk Assessment Cervical / Trunk Assessment: Normal  Communication   Communication: No  difficulties  Cognition Arousal/Alertness: Awake/alert Behavior During Therapy: WFL for tasks assessed/performed Overall Cognitive Status: Within Functional Limits for tasks assessed                                        General Comments      Exercises     Assessment/Plan    PT Assessment Patent does not need any further PT services  PT Problem List         PT Treatment Interventions      PT Goals (Current goals can be found in the Care Plan section)  Acute Rehab PT Goals Patient Stated Goal: return home PT Goal Formulation: With patient/family Time For Goal Achievement: 10/18/18 Potential to Achieve Goals: Good    Frequency     Barriers to discharge        Co-evaluation               AM-PAC PT "6 Clicks" Mobility  Outcome Measure Help needed turning from your back to your side while in a flat bed without using bedrails?: None Help needed moving from lying on your back to sitting on the side of a flat bed without using bedrails?: None Help needed moving to and from a bed to a chair (including a wheelchair)?: None Help needed standing up from a chair using your arms (e.g., wheelchair or bedside chair)?: None Help needed to walk in hospital room?: None Help needed climbing 3-5 steps with a railing? : None 6 Click Score: 24    End of Session   Activity Tolerance: Patient tolerated treatment well Patient left: in bed;with call bell/phone within reach;with family/visitor present(seated at bedside) Nurse Communication: Mobility status PT Visit Diagnosis: Unsteadiness on feet (R26.81);Other abnormalities of gait and mobility (R26.89);Muscle weakness (generalized) (M62.81)    Time: 1655-3748 PT Time Calculation (min) (ACUTE ONLY): 25 min   Charges:   PT Evaluation $PT Eval Moderate Complexity: 1 Mod PT Treatments $Gait Training: 23-37 mins        1:50 PM, 10/18/18 Lonell Grandchild, MPT Physical Therapist with [facility] 336 [phone] office [phone] mobile phone

## 2018-10-18 NOTE — Plan of Care (Signed)
progressing 

## 2018-10-18 NOTE — ED Triage Notes (Signed)
Pt states she does not feel good, per ems pt is hypertensive. Pt states she felt sob at times and states yesterday her left hand/arm felt weak and she states she could not figure out how to turn pages in book.

## 2018-10-18 NOTE — Evaluation (Signed)
Occupational Therapy Evaluation Patient Details Name: Christina Silva MRN: [id number] DOB: 10-23-1932 Today's Date: 10/18/2018    History of Present Illness Christina Silva is a 85 y.o. female with medical history significant of anxiety, cataracts, GERD, hyperlipidemia, hypertension, vertigo who is coming to the emergency department with complaints of left upper extremity weakness since yesterday morning.  She states that she was having trouble turning the pages of a book.she denies blurred vision, slurred speech, language comprehension inability, left lower extremity involvement, dizziness, but states that I her gait was a bit unsteady. She states that she has also been having palpitation and feeling dyspneic at times.  She mentions that this past weekend her BP was elevated and she has some vision changes (she was seeing small bright glittering spots).  Subsequently, EMS was called early in the afternoon, but the patient declined to come to the hospital at that time as her symptoms were getting better.  Yesterday evening, the patient went to sleep, but woke up with what she thinks was an anxiety attack.  She was dyspneic, her blood pressure was elevated and she was having palpitations. MRI positive for acute infarct.    Clinical Impression   Pt received supine in bed, daughter present, agreeable to OT evaluation. Pt reporting symptoms have improved and she is feeling much better today. Upon assessment LUE strength is good at 4+/5, RUE slightly weaker due to prior rotator cuff injury and repair. Pt coordination and sensation are intact, performing fine motor tasks without difficulty. Pt is at baseline with ADL completion, no further OT services required at this time.     Follow Up Recommendations  No OT follow up    Equipment Recommendations  None recommended by OT       Precautions / Restrictions Precautions Precautions: None Restrictions Weight Bearing Restrictions: No      Mobility  Bed Mobility Overal bed mobility: Independent                Transfers Overall transfer level: Modified independent Equipment used: None                      ADL either performed or assessed with clinical judgement   ADL Overall ADL's : Modified independent;At baseline                                       General ADL Comments: Assist with RLE dressing due to soreness when bending knee from cortisone shot this past week     Vision Baseline Vision/History: Wears glasses Wears Glasses: At all times Patient Visual Report: No change from baseline              Pertinent Vitals/Pain Pain Assessment: 0-10 Pain Score: 6  Pain Location: right knee Pain Descriptors / Indicators: Aching Pain Intervention(s): Limited activity within patient's tolerance;Monitored during session;Repositioned     Hand Dominance Right   Extremity/Trunk Assessment Upper Extremity Assessment Upper Extremity Assessment: Overall WFL for tasks assessed(LUE 4+/5, RUE 4-/5)   Lower Extremity Assessment Lower Extremity Assessment: Defer to PT evaluation   Cervical / Trunk Assessment Cervical / Trunk Assessment: Normal   Communication Communication Communication: No difficulties   Cognition Arousal/Alertness: Awake/alert Behavior During Therapy: WFL for tasks assessed/performed Overall Cognitive Status: Within Functional Limits for tasks assessed  Home Living Family/patient expects to be discharged to:: Private residence Living Arrangements: Alone Available Help at Discharge: Family;Available PRN/intermittently Type of Home: House Home Access: Stairs to enter [employer] of Steps: 3 Entrance Stairs-Rails: Right Home Layout: One level     Bathroom Shower/Tub: Teacher, early years/pre: Standard     Home Equipment: Environmental consultant - 2 wheels;Shower seat          Prior  Functioning/Environment Level of Independence: Independent        Comments: Independent in ADLs and functional mobility        OT Problem List: Decreased activity tolerance                       Co-evaluation PT/OT/SLP Co-Evaluation/Treatment: Yes Reason for Co-Treatment: Complexity of the patient's impairments (multi-system involvement)   OT goals addressed during session: ADL's and self-care      AM-PAC OT "6 Clicks" Daily Activity     Outcome Measure Help from another person eating meals?: None Help from another person taking care of personal grooming?: None Help from another person toileting, which includes using toliet, bedpan, or urinal?: None Help from another person bathing (including washing, rinsing, drying)?: None Help from another person to put on and taking off regular upper body clothing?: None Help from another person to put on and taking off regular lower body clothing?: A Little 6 Click Score: 23   End of Session    Activity Tolerance: Patient tolerated treatment well Patient left: in bed;with call bell/phone within reach;with family/visitor present  OT Visit Diagnosis: Hemiplegia and hemiparesis Hemiplegia - Right/Left: Left Hemiplegia - dominant/non-dominant: Non-Dominant Hemiplegia - caused by: Cerebral infarction                Time: 3875-6433 OT Time Calculation (min): 27 min Charges:  OT General Charges $OT Visit: 1 Visit OT Evaluation $OT Eval Low Complexity: 1 Low   Guadelupe Sabin, OTR/L  [phone] 10/18/2018, 9:20 AM

## 2018-10-18 NOTE — Care Management Obs Status (Deleted)
[facility] NOTIFICATION   Patient Details  Name: RANESSA KOSTA MRN: [id number] Date of Birth: [birth year]   Medicare Observation Status Notification Given:       Tommy Medal 10/18/2018, 10:51 AM

## 2018-10-18 NOTE — Progress Notes (Signed)
SLP Cancellation Note  Patient Details Name: Christina Silva MRN: [id number] DOB: [birth year]   Cancelled treatment:       Reason Eval/Treat Not Completed: SLP screened, no needs identified, will sign off. Pt is at cognitive linguistic baseline, Thank you for this referral.  Lovena Kluck H. Roddie Mc, CCC-SLP Speech Language Pathologist    Wende Bushy 10/18/2018, 9:38 AM

## 2018-10-18 NOTE — ED Provider Notes (Signed)
Emergency Department Provider Note   I have reviewed the triage vital signs and the nursing notes.   HISTORY  Chief Complaint Weakness   HPI Christina Silva is a 85 y.o. female who presents the emergency department today for multiple symptoms.  Saw that the patient noticed that she has some left arm weakness and difficulty using her left arm earlier today.  She has some elevated blood pressures throughout the day and then she went to sleep and woke up and she thinks that she had an anxiety attack where she felt very short of breath and anxious which is since resolved but she still feels some abnormal feeling in her left arm like it is weak.  She has no history of stroke.  She has some pain in her right knee from a what sounds like calcium deposition disease but that is more chronic in nature.  No obvious slurred speech or other associated symptoms per patient's report. No other associated or modifying symptoms.    Past Medical History:  Diagnosis Date  . Anxiety   . Cataract   . GERD (gastroesophageal reflux disease)   . Hyperlipidemia   . Hypertension   . Vertigo     Patient Active Problem List   Diagnosis Date Noted  . Osteopenia 05/24/2015  . GERD (gastroesophageal reflux disease) 01/08/2013  . Hyperlipidemia with target LDL less than 100 01/08/2013  . HTN (hypertension) 01/08/2013  . Anxiety state 01/08/2013    Past Surgical History:  Procedure Laterality Date  . ABDOMINAL HYSTERECTOMY  1980  . CATARACT EXTRACTION Right   . EYE SURGERY    . ROTATOR CUFF REPAIR Left     Current Outpatient Rx  . Order #: [id number] Class: Normal  . Order #: [id number] Class: Normal  . Order #: [id number] Class: Historical Med  . Order #: [id number] Class: Normal  . Order #: [id number] Class: Normal  . Order #: [id number] Class: OTC  . Order #: [id number] Class: Normal    Allergies Aspirin; Codeine; Lisinopril; Morphine; and Sulfonamide derivatives  Family History  Problem Relation  Age of Onset  . Heart attack Mother   . Hyperlipidemia Mother   . Transient ischemic attack Father   . Psychiatric Illness Sister   . Epilepsy Brother     Social History Social History   Tobacco Use  . Smoking status: Former Smoker    Types: Cigarettes    Last attempt to quit: 01/09/1983    Years since quitting: 35.7  . Smokeless tobacco: Never Used  Substance Use Topics  . Alcohol use: No  . Drug use: No    Review of Systems  All other systems negative except as documented in the HPI. All pertinent positives and negatives as reviewed in the HPI. ____________________________________________   PHYSICAL EXAM:  VITAL SIGNS: Vitals:   10/18/18 0148  BP: (!) 157/86  Pulse: 86  Resp: 16  Temp: 98.2 F (36.8 C)  TempSrc: Oral  SpO2: 95%    Constitutional: Alert and oriented. Well appearing and in no acute distress. Eyes: Conjunctivae are normal. PERRL. EOMI. Head: Atraumatic. Nose: No congestion/rhinnorhea. Mouth/Throat: Mucous membranes are moist.  Oropharynx non-erythematous. Neck: No stridor.  No meningeal signs.   Cardiovascular: Normal rate, regular rhythm. Good peripheral circulation. Grossly normal heart sounds.   Respiratory: Normal respiratory effort.  No retractions. Lungs CTAB. Gastrointestinal: Soft and nontender. No distention.  Musculoskeletal: No lower extremity tenderness nor edema. No gross deformities of extremities. Neurologic:  No altered mental status, able to give full seemingly accurate history.  Face is symmetric, EOM's intact, pupils equal and reactive, vision intact, tongue and uvula midline without deviation. Upper and Lower extremity motor symmetric but with mild left pronator drift, intact pain perception in distal extremities, 2+ reflexes in biceps, patella and achilles tendons. Able to perform finger to nose normal with both hands but with dysmetria on left..  Skin:  Skin is warm, dry and intact. No rash  noted.   ____________________________________________   LABS (all labs ordered are listed, but only abnormal results are displayed)  Labs Reviewed  CBC - Abnormal; Notable for the following components:      Result Value   WBC 11.9 (*)    All other components within normal limits  DIFFERENTIAL - Abnormal; Notable for the following components:   Neutro Abs 9.8 (*)    All other components within normal limits  COMPREHENSIVE METABOLIC PANEL - Abnormal; Notable for the following components:   Glucose, Bld 134 (*)    All other components within normal limits  ETHANOL  PROTIME-INR  APTT  RAPID URINE DRUG SCREEN, HOSP PERFORMED  URINALYSIS, ROUTINE W REFLEX MICROSCOPIC   ____________________________________________  EKG   EKG Interpretation  Date/Time:  Friday October 18 2018 01:43:02 EST Ventricular Rate:  77 PR Interval:    QRS Duration: 95 QT Interval:  394 QTC Calculation: 446 R Axis:   -21 Text Interpretation:  Sinus rhythm Borderline left axis deviation Anterior infarct, old Borderline T abnormalities, inferior leads No acute changes Confirmed by Merrily Pew [phone] on 10/18/2018 3:13:35 AM       ____________________________________________  RADIOLOGY  Dg Chest 2 View  Result Date: 10/18/2018 CLINICAL DATA:  Short of breath EXAM: CHEST - 2 VIEW COMPARISON:  11/26/2017 FINDINGS: No acute consolidation or effusion. Borderline heart size. Aortic atherosclerosis. No pneumothorax. IMPRESSION: No active cardiopulmonary disease.  Mildly low lung volume Electronically Signed   By: Donavan Foil M.D.   On: 10/18/2018 02:29   Ct Head Wo Contrast  Result Date: 10/18/2018 CLINICAL DATA:  Shortness of breath, left upper extremity weakness and confusion. EXAM: CT HEAD WITHOUT CONTRAST TECHNIQUE: Contiguous axial images were obtained from the base of the skull through the vertex without intravenous contrast. COMPARISON:  Head CT 11/26/2017 FINDINGS: Brain: There is no mass,  hemorrhage or extra-axial collection. The size and configuration of the ventricles and extra-axial CSF spaces are normal. There is hypoattenuation of the white matter, most commonly indicating chronic small vessel disease. Vascular: Atherosclerotic calcification of the internal carotid arteries at the skull base. No abnormal hyperdensity of the major intracranial arteries or dural venous sinuses. Skull: The visualized skull base, calvarium and extracranial soft tissues are normal. Sinuses/Orbits: No fluid levels or advanced mucosal thickening of the visualized paranasal sinuses. No mastoid or middle ear effusion. The orbits are normal. IMPRESSION: 1. No acute intracranial abnormality. 2. Findings of chronic small vessel disease. Electronically Signed   By: Ulyses Jarred M.D.   On: 10/18/2018 02:26    ____________________________________________   PROCEDURES  Procedure(s) performed:   Procedures   ____________________________________________   INITIAL IMPRESSION / ASSESSMENT AND PLAN / ED COURSE  eval for TIA.   Workup ok. But with persistent pronator drift/dysmetria, will admit for further workup and MRI.      Pertinent labs & imaging results that were available during my care of the patient were reviewed by me and considered in my medical decision making (see chart for details).  ____________________________________________  FINAL CLINICAL IMPRESSION(S) / ED DIAGNOSES  Final diagnoses:  None  MEDICATIONS GIVEN DURING THIS VISIT:  Medications - No data to display   NEW OUTPATIENT MEDICATIONS STARTED DURING THIS VISIT:  New Prescriptions   No medications on file    Note:  This note was prepared with assistance of Dragon voice recognition software. Occasional wrong-word or sound-a-like substitutions may have occurred due to the inherent limitations of voice recognition software.   Jourdin Gens, Corene Cornea, MD 10/18/18 [phone]

## 2018-10-19 DIAGNOSIS — K219 Gastro-esophageal reflux disease without esophagitis: Secondary | ICD-10-CM

## 2018-10-19 DIAGNOSIS — E785 Hyperlipidemia, unspecified: Secondary | ICD-10-CM

## 2018-10-19 DIAGNOSIS — I639 Cerebral infarction, unspecified: Principal | ICD-10-CM

## 2018-10-19 DIAGNOSIS — I10 Essential (primary) hypertension: Secondary | ICD-10-CM

## 2018-10-19 LAB — C-REACTIVE PROTEIN: CRP: <0.8 mg/dL (ref <1.0)

## 2018-10-19 MED ORDER — CLOPIDOGREL BISULFATE 75 MG PO TABS: 75.0000 mg | ORAL_TABLET | Freq: Every day | ORAL | 0 refills | Status: AC

## 2018-10-19 MED ORDER — ATORVASTATIN CALCIUM 40 MG PO TABS: 40.0000 mg | ORAL_TABLET | Freq: Every day | ORAL | 0 refills | Status: DC

## 2018-10-19 MED ORDER — ASPIRIN 325 MG PO TABS: 325.0000 mg | ORAL_TABLET | Freq: Every day | ORAL | Status: DC

## 2018-10-19 MED ORDER — LOSARTAN POTASSIUM 100 MG PO TABS: 100.0000 mg | ORAL_TABLET | Freq: Every day | ORAL | 1 refills | Status: DC

## 2018-10-19 NOTE — Progress Notes (Signed)
IV removed, WNL. D/C instructions given to pt. Verbalized understanding. Educated patient on the acronym of "FAST." Pt verbalized understanding. Family at bedside to transport home.

## 2018-10-19 NOTE — Discharge Summary (Signed)
Physician Discharge Summary  Christina Silva:[id number] DOB: 01-Mar-1933 DOA: 10/18/2018  PCP: [name], [place] Neurology: Merlene Laughter  Admit date: 10/18/2018 Discharge date: 10/19/2018  Admitted From: Home  Disposition: Home   Recommendations for Outpatient Follow-up:  1. Follow up with PCP in 1 weeks 2. Follow up with neurology in 4 weeks 3. Please discuss estrogen use in setting of recent stroke, held at discharge  Discharge Condition: STABLE   CODE STATUS: FULL    Brief Hospitalization Summary: Please see all hospital notes, images, labs for full details of the hospitalization. HPI: Christina Silva is a 85 y.o. female with medical history significant of anxiety, cataracts, GERD, hyperlipidemia, hypertension, vertigo who is coming to the emergency department with complaints of left upper extremity weakness since yesterday morning.  She states that she was having trouble turning the pages of a book.she denies blurred vision, slurred speech, language comprehension inability, left lower extremity involvement, dizziness, but states that I her gait was a bit unsteady. She states that she has also been having palpitation and feeling dyspneic at times.  She mentions that this past weekend her BP was elevated and she has some vision changes (she was seeing small bright glittering spots).  Subsequently, EMS was called early in the afternoon, but the patient declined to come to the hospital at that time as her symptoms were getting better.  Yesterday evening, the patient went to sleep, but woke up with what she thinks was an anxiety attack.  She was dyspneic, her blood pressure was elevated and she was having palpitations.  She denies fever, chills, rhinorrhea, sore throat, wheezing, hemoptysis, chest pain, PND or orthopnea.  She recently had lower extremity edema, but used hydrochlorothiazide for a few days on an as-needed basis and edema has resolved.  She denies abdominal pain, nausea,  emesis, diarrhea, constipation, melena or hematochezia.  No dysuria, frequency or hematuria.  Denies skin pruritus.  No polyuria, polydipsia, polyphagia or blurred vision.  Patient states that she has bad arthritis of her right knee and had an intra-articular steroid injection a few days ago.  ED Course: Initial vital signs temperature 98.2 F, pulse 86, respirations 16, blood pressure 157/86 mmHg and O2 sat 95% on room air.   Her white count is 11.9, hemoglobin 13.5 g/dL and platelets 332.  PT/INR/APTT were within normal limits.  CMP shows a glucose of 134 mg/dL, but is otherwise negative.  EtOH level was normal.  Magnesium was 1.9 and phosphorus 2.4 mg/dL.  Imaging: Chest radiograph had low volumes, but no active cardiopulmonary disease was seen.  CT head did not show any acute intracranial pathology.  Please see images and full radiology report for further detail.  85 year old white female who presents with acute onset of weakness involving the left upper extremity.  Symptoms were associated with numbness, dyspnea and shortness of breath.  She also has been having significant frontal headaches over the last few weeks.  She apparently has had some issues with insomnia, anxiety and panic attacks especially since her husband died last 01/01/2023.  She lives alone and is highly functional.  She does not report having significant symptoms involving the left lower extremity.  MRI positive for acute right basal ganglia infarct.  Pt was anticoagulated with aspirin and plavix.  Pt was seen by PT/OT/SLP but no service recommendations reported.  Pt did very well and symptoms rapidly improved.  Pt's Total cholesterol and LDL was elevated and was started on atorvastatin 40 mg daily.  Pt was  seen by neurologist and recommendations noted below.  Pt is to take DAPT aspirin and plavix for 30 days then aspirin daily.  Pt has tolerated aspirin in hospital.  If cannot tolerate aspirin then would have her take plavix only.   Follow up with Dr. Merlene Laughter in 4 weeks, Follow up with PCP in 1 week for recheck recommended.    Neurology Recommendations:  Acute left-sided numbness and weakness with imaging showing infarct involving deep white matter region on the corresponding right side.  There is some suggestion of reduced flow involving the M2 segment of the MCA on the right side suggestive of intracranial disease.  This still may be a small vessel issue however.  I recommend aspirin Plavix combination for a month.  Subsequently, aspirin 325 can be continued and Plavix discontinued.  Agree with the addition of a statin.   ECHOCARDIOGRAM   1. The left ventricle has normal systolic function with an ejection fraction of 60-65%. The cavity size was normal. There is basal septal hypertophy. Left ventricular diastolic Doppler parameters are consistent with impaired relaxation. 2. The right ventricle has normal systolic function. The cavity was normal. There is no increase in right ventricular wall thickness. 3. The aortic valve is tricuspid Moderate thickening of the aortic valve Moderate calcification of the aortic valve. Aortic valve regurgitation is mild by color flow Doppler.. Moderate aortic annular calcification noted. 4. The mitral valve is normal in structure. Mild thickening of the mitral valve leaflet. Mild calcification of the mitral valve leaflet. There is mild mitral annular calcification present. No evidence of mitral valve stenosis. 5. The tricuspid valve is normal in structure. 6. The pulmonic valve was grossly normal. Pulmonic valve regurgitation is mild by color flow Doppler. 7. The aortic root is normal in size and structure. 8. Pulmonary hypertension is indeterminat,inadequate TR jet.  Carotid Dopplers: no significant stenosis found.   Discharge Diagnoses:  Principal Problem:   Acute CVA (cerebrovascular accident) ([place]) Active Problems:   GERD (gastroesophageal reflux disease)   Hyperlipidemia  with target LDL less than 100   HTN (hypertension)   Anxiety   Hyperglycemia   Discharge Instructions: Discharge Instructions    Call MD for:  difficulty breathing, headache or visual disturbances   Complete by:  As directed    Call MD for:  extreme fatigue   Complete by:  As directed    Call MD for:  persistant dizziness or light-headedness   Complete by:  As directed    Call MD for:  persistant nausea and vomiting   Complete by:  As directed    Diet - low sodium heart healthy   Complete by:  As directed    Increase activity slowly   Complete by:  As directed      Allergies as of 10/19/2018      Reactions   Codeine Nausea And Vomiting   Lisinopril Swelling   Morphine Nausea And Vomiting   Sulfa Antibiotics Nausea Only, Rash   Sulfonamide Derivatives Rash      Medication List    STOP taking these medications   estradiol 1 MG tablet Commonly known as:  ESTRACE     TAKE these medications   aspirin 325 MG tablet Take 1 tablet (325 mg total) by mouth daily. Start taking on:  October 20, 2018   atorvastatin 40 MG tablet Commonly known as:  LIPITOR Take 1 tablet (40 mg total) by mouth daily at 6 PM for 30 days.   clopidogrel 75 MG tablet Commonly  known as:  PLAVIX Take 1 tablet (75 mg total) by mouth daily for 30 days. Start taking on:  October 20, 2018   diazepam 2 MG tablet Commonly known as:  VALIUM TAKE 1 TABLET EVERY 8 HOURS AS NEEDED FOR ANXIETY What changed:  See the new instructions.   Fish Oil 1200 MG Caps Take 3 capsules (3,600 mg total) by mouth daily. What changed:  how much to take   fluticasone 50 MCG/ACT nasal spray Commonly known as:  FLONASE Place 1 spray into both nostrils daily as needed for allergies. For allergies   losartan 100 MG tablet Commonly known as:  COZAAR Take 1 tablet (100 mg total) by mouth daily. What changed:  when to take this   omeprazole 20 MG capsule Commonly known as:  PRILOSEC TAKE ONE (1) CAPSULE EACH  DAY What changed:  See the new instructions.   SYSTANE OP Apply 1 drop to eye daily.      Follow-up Information    Phillips Odor, MD. Schedule an appointment as soon as possible for a visit in 1 month(s).   Specialty:  Neurology Why:  stroke follow up appointment  Contact information: [address] [place] [address] [phone]        [name], [place]. Schedule an appointment as soon as possible for a visit in 1 week(s).   Specialty:  Family Medicine Why:  Hospital follow Up  Contact information: [address] [place] [address] [phone]          Allergies  Allergen Reactions  . Codeine Nausea And Vomiting  . Lisinopril Swelling  . Morphine Nausea And Vomiting  . Sulfa Antibiotics Nausea Only and Rash  . Sulfonamide Derivatives Rash   Allergies as of 10/19/2018      Reactions   Codeine Nausea And Vomiting   Lisinopril Swelling   Morphine Nausea And Vomiting   Sulfa Antibiotics Nausea Only, Rash   Sulfonamide Derivatives Rash      Medication List    STOP taking these medications   estradiol 1 MG tablet Commonly known as:  ESTRACE     TAKE these medications   aspirin 325 MG tablet Take 1 tablet (325 mg total) by mouth daily. Start taking on:  October 20, 2018   atorvastatin 40 MG tablet Commonly known as:  LIPITOR Take 1 tablet (40 mg total) by mouth daily at 6 PM for 30 days.   clopidogrel 75 MG tablet Commonly known as:  PLAVIX Take 1 tablet (75 mg total) by mouth daily for 30 days. Start taking on:  October 20, 2018   diazepam 2 MG tablet Commonly known as:  VALIUM TAKE 1 TABLET EVERY 8 HOURS AS NEEDED FOR ANXIETY What changed:  See the new instructions.   Fish Oil 1200 MG Caps Take 3 capsules (3,600 mg total) by mouth daily. What changed:  how much to take   fluticasone 50 MCG/ACT nasal spray Commonly known as:  FLONASE Place 1 spray into both nostrils daily as needed for allergies. For allergies    losartan 100 MG tablet Commonly known as:  COZAAR Take 1 tablet (100 mg total) by mouth daily. What changed:  when to take this   omeprazole 20 MG capsule Commonly known as:  PRILOSEC TAKE ONE (1) CAPSULE EACH DAY What changed:  See the new instructions.   SYSTANE OP Apply 1 drop to eye daily.       Procedures/Studies: Dg Chest 2 View  Result Date: 10/18/2018 CLINICAL  DATA:  Short of breath EXAM: CHEST - 2 VIEW COMPARISON:  11/26/2017 FINDINGS: No acute consolidation or effusion. Borderline heart size. Aortic atherosclerosis. No pneumothorax. IMPRESSION: No active cardiopulmonary disease.  Mildly low lung volume Electronically Signed   By: Donavan Foil M.D.   On: 10/18/2018 02:29   Ct Head Wo Contrast  Result Date: 10/18/2018 CLINICAL DATA:  Shortness of breath, left upper extremity weakness and confusion. EXAM: CT HEAD WITHOUT CONTRAST TECHNIQUE: Contiguous axial images were obtained from the base of the skull through the vertex without intravenous contrast. COMPARISON:  Head CT 11/26/2017 FINDINGS: Brain: There is no mass, hemorrhage or extra-axial collection. The size and configuration of the ventricles and extra-axial CSF spaces are normal. There is hypoattenuation of the white matter, most commonly indicating chronic small vessel disease. Vascular: Atherosclerotic calcification of the internal carotid arteries at the skull base. No abnormal hyperdensity of the major intracranial arteries or dural venous sinuses. Skull: The visualized skull base, calvarium and extracranial soft tissues are normal. Sinuses/Orbits: No fluid levels or advanced mucosal thickening of the visualized paranasal sinuses. No mastoid or middle ear effusion. The orbits are normal. IMPRESSION: 1. No acute intracranial abnormality. 2. Findings of chronic small vessel disease. Electronically Signed   By: Ulyses Jarred M.D.   On: 10/18/2018 02:26   Mr Brain Wo Contrast  Result Date: 10/18/2018 CLINICAL DATA:  New  onset left upper extremity weakness beginning yesterday morning. EXAM: MRI HEAD WITHOUT CONTRAST TECHNIQUE: Multiplanar, multiecho pulse sequences of the brain and surrounding structures were obtained without intravenous contrast. COMPARISON:  CT head without contrast 10/18/2018 FINDINGS: Brain: Diffusion-weighted images demonstrates an acute nonhemorrhagic infarct involving the right lentiform nucleus and centrum semi ovale. No associated hemorrhage is present. There is no significant mass effect. Moderate periventricular and scattered subcortical T2 signal changes are present bilaterally. The ventricles are of proportionate to the degree of atrophy. No significant extraaxial fluid collection is present. Vascular: Flow is present in the major intracranial arteries. Skull and upper cervical spine: The craniocervical junction is normal. Upper cervical spine is within normal limits. Marrow signal is unremarkable. Sinuses/Orbits: The paranasal sinuses and mastoid air cells are clear. Bilateral lens replacements are noted. Globes and orbits are otherwise unremarkable. IMPRESSION: 1. Acute nonhemorrhagic infarct measuring up to 2 cm from the right lentiform nucleus into the right centrum semi ovale. 2. Moderate atrophy and white matter disease otherwise likely reflects the sequela of chronic microvascular ischemia. Electronically Signed   By: San Morelle M.D.   On: 10/18/2018 08:49   US Carotid Bilateral (at [facility] And Ap Only)  Result Date: 10/18/2018 CLINICAL DATA:  85 year old female with a history weakness EXAM: BILATERAL CAROTID DUPLEX ULTRASOUND TECHNIQUE: Pearline Cables scale imaging, color Doppler and duplex ultrasound were performed of bilateral carotid and vertebral arteries in the neck. COMPARISON:  None. FINDINGS: Criteria: Quantification of carotid stenosis is based on velocity parameters that correlate the residual internal carotid diameter with NASCET-based stenosis levels, using the diameter of the  distal internal carotid lumen as the denominator for stenosis measurement. The following velocity measurements were obtained: RIGHT ICA:  Systolic 308 cm/sec, Diastolic 26 cm/sec CCA:  92 cm/sec SYSTOLIC ICA/CCA RATIO:  1.1 ECA:  121 cm/sec LEFT ICA:  Systolic 99 cm/sec, Diastolic 24 cm/sec CCA:  657 cm/sec SYSTOLIC ICA/CCA RATIO:  0.9 ECA:  113 cm/sec Right Brachial SBP: Not acquired Left Brachial SBP: Not acquired RIGHT CAROTID ARTERY: No significant calcifications of the right common carotid artery. Intermediate waveform maintained. Heterogeneous and partially  calcified plaque at the right carotid bifurcation. No significant lumen shadowing. Low resistance waveform of the right ICA. No significant tortuosity. RIGHT VERTEBRAL ARTERY: Antegrade flow with low resistance waveform. LEFT CAROTID ARTERY: No significant calcifications of the left common carotid artery. Intermediate waveform maintained. Heterogeneous and partially calcified plaque at the left carotid bifurcation without significant lumen shadowing. Low resistance waveform of the left ICA. No significant tortuosity. LEFT VERTEBRAL ARTERY:  Antegrade flow with low resistance waveform. IMPRESSION: Color duplex indicates minimal heterogeneous and calcified plaque, with no hemodynamically significant stenosis by duplex criteria in the extracranial cerebrovascular circulation. Signed, Dulcy Fanny. Dellia Nims, RPVI [facility] Electronically Signed   By: Corrie Mckusick D.O.   On: 10/18/2018 09:01   Mr Jodene Nam Head/brain OQ Cm  Result Date: 10/18/2018 CLINICAL DATA:  Right basal ganglia infarct. EXAM: MRA HEAD WITHOUT CONTRAST TECHNIQUE: Angiographic images of the Circle of Willis were obtained using MRA technique without intravenous contrast. COMPARISON:  MRI brain of the same day. FINDINGS: Minimal atherosclerotic changes are present in the cavernous internal carotid arteries bilaterally. There is no  significant stenosis through the ICA termini. The A1 and M1 segments are. The anterior communicating artery is patent. ACA and MCA branch vessels mild distal small vessel disease without a significant proximal stenosis or occlusion. There is no aneurysm. The vertebral arteries are codominant. PICA origins are not visualized. AICA vessels are dominant. Basilar artery is. Both posterior cerebral arteries originate from the basilar tip. There is a high-grade stenosis in the proximal left P2 segment. There is some attenuation of distal PCA branch vessels bilaterally without other significant proximal stenosis. IMPRESSION: 1. High-grade stenosis of the proximal left P2 segment. 2. Mild to moderate distal small vessel disease both anteriorly and posteriorly. 3. No other significant proximal stenosis or occlusion. Electronically Signed   By: San Morelle M.D.   On: 10/18/2018 09:03     Subjective: Pt feels well and wants to go home.    Discharge Exam: Vitals:   10/19/18 0704 10/19/18 1104  BP: 139/72 132/71  Pulse: 83 77  Resp: 18 16  Temp: 98.1 F (36.7 C) (!) 97.5 F (36.4 C)  SpO2: 97% 95%   Vitals:   10/18/18 2304 10/19/18 0316 10/19/18 0704 10/19/18 1104  BP: (!) 152/70 135/75 139/72 132/71  Pulse: 79 89 83 77  Resp: 20 18 18 16   Temp: 98.2 F (36.8 C) 97.8 F (36.6 C) 98.1 F (36.7 C) (!) 97.5 F (36.4 C)  TempSrc: Oral Oral Oral Oral  SpO2: 96% 95% 97% 95%  Weight:      Height:       General: Pt is alert, awake, not in acute distress Cardiovascular: RRR, S1/S2 +, no rubs, no gallops Respiratory: CTA bilaterally, no wheezing, no rhonchi Abdominal: Soft, NT, ND, bowel sounds + Extremities: no edema, no cyanosis Neurological: nonfocal exam.    The results of significant diagnostics from this hospitalization (including imaging, microbiology, ancillary and laboratory) are listed below for reference.     Microbiology: No results found for this or any previous visit (from  the past 240 hour(s)).   Labs: BNP (last 3 results) No results for input(s): BNP in the last 8760 hours. Basic Metabolic Panel: Recent Labs  Lab 10/18/18 0130  NA 140  K 3.5  CL 107  CO2 24  GLUCOSE 134*  BUN 13  CREATININE 0.55  CALCIUM 9.5  MG 1.9  PHOS 2.4*   Liver Function Tests: Recent Labs  Lab 10/18/18 0130  AST 20  ALT 14  ALKPHOS 65  BILITOT 0.3  PROT 6.9  ALBUMIN 3.5   No results for input(s): LIPASE, AMYLASE in the last 168 hours. No results for input(s): AMMONIA in the last 168 hours. CBC: Recent Labs  Lab 10/18/18 0130  WBC 11.9*  NEUTROABS 9.8*  HGB 13.5  HCT 40.9  MCV 91.3  PLT 332   Cardiac Enzymes: No results for input(s): CKTOTAL, CKMB, CKMBINDEX, TROPONINI in the last 168 hours. BNP: Invalid input(s): POCBNP CBG: No results for input(s): GLUCAP in the last 168 hours. D-Dimer No results for input(s): DDIMER in the last 72 hours. Hgb A1c Recent Labs    10/18/18 0130  HGBA1C 5.8*   Lipid Profile Recent Labs    10/18/18 0625  CHOL 231*  HDL 65  LDLCALC 139*  TRIG 137  CHOLHDL 3.6   Thyroid function studies No results for input(s): TSH, T4TOTAL, T3FREE, THYROIDAB in the last 72 hours.  Invalid input(s): FREET3 Anemia work up No results for input(s): VITAMINB12, FOLATE, FERRITIN, TIBC, IRON, RETICCTPCT in the last 72 hours. Urinalysis    Component Value Date/Time   COLORURINE STRAW (A) 10/18/2018 0408   APPEARANCEUR CLEAR 10/18/2018 0408   APPEARANCEUR Clear 09/26/2018 1154   LABSPEC 1.004 (L) 10/18/2018 0408   PHURINE 7.0 10/18/2018 0408   GLUCOSEU NEGATIVE 10/18/2018 0408   HGBUR NEGATIVE 10/18/2018 0408   BILIRUBINUR NEGATIVE 10/18/2018 0408   BILIRUBINUR Negative 09/26/2018 1154   KETONESUR NEGATIVE 10/18/2018 0408   PROTEINUR NEGATIVE 10/18/2018 0408   NITRITE NEGATIVE 10/18/2018 0408   LEUKOCYTESUR NEGATIVE 10/18/2018 0408   Sepsis Labs Invalid input(s): PROCALCITONIN,  WBC,  LACTICIDVEN Microbiology No  results found for this or any previous visit (from the past 240 hour(s)).  Time coordinating discharge:  32 Mins    SIGNED:  Irwin Brakeman, MD  [facility] 10/19/2018, 11:36 AM How to contact the [facility] Attending or Consulting provider [place] or covering provider during after hours [place], for this patient?  1. Check the care team in [facility] and look for a) attending/consulting [facility] provider listed and b) the [facility] team listed 2. Log into [email or web address] and use 's universal password to access. If you do not have the password, please contact the hospital operator. 3. Locate the [facility] provider you are looking for under [facility] and page to a number that you can be directly reached. 4. If you still have difficulty reaching the provider, please page the [facility] (Director on Call) for the Hospitalists listed on amion for assistance.

## 2018-10-21 ENCOUNTER — Telehealth: Payer: Self-pay | Admitting: *Deleted

## 2018-10-21 NOTE — Telephone Encounter (Signed)
Transitional Care Management  Telephone Contact Attempt  I attempted to contact Christina Silva by telephone on her preferred home telephone number. There was no answer and I was unable to leave a message for her to return my call. I would like to discuss Transitional Care Management, medication reconciliation, and to schedule a TCM hospital follow-up with [name].   Discharge Date: 10/19/2018 Location: [facility] Discharge Dx: weakness, CVA-right basal ganglia infarct  Recommendations for Outpatient Follow-up (from discharge summary): 1. Follow up with PCP in 1 weeks 2. Follow up with neurology in 4 weeks 3. Please discuss estrogen use in setting of recent stroke, held at discharge  Plan Will attempt to contact again within the 2 business day post discharge window.    Chong Sicilian, RN-BC, BSN Nurse Case Manager [facility] Ph: [phone]

## 2018-10-21 NOTE — Telephone Encounter (Signed)
TRANSITIONAL CARE MANAGEMENT Telephone Note  10/21/2018  Christina Silva is a primary care patient of [name], FNP who was hospitalized on 10/18/2018 with complaints of weakness.   I spoke with the patient or caregiver and offered to schedule an appointment for Transitional Care Management. She stated that she would call back to schedule a visit with [name], FNP within the next 2 weeks.   Christina Silva is knowledgeable of his/her condition(s) and treatment.    DISCHARGE INFORMATION Date of Discharge:10/19/2018  Discharge Facility: [facility]  Principal Discharge Diagnosis: Acute CVA  Outpatient Follow Up Recommendations (from discharge summary) 1. Follow up with PCP in 1 weeks 2. Follow up with neurology, Dr Merlene Laughter, in 4 weeks 3. Please discuss estrogen use in setting of recent stroke, held at discharge   [place] RECONCILIATION Changes Made to Patient Medication List: Plavix and ASA 325mg  was prescribed at discharge and Christina Silva is taking them as prescribed.  Estradiol 1 mg tablet was discontinued and Christina Silva is not taking it at home.   The following Current Medication List was reviewed with the patient: Outpatient Encounter Medications as of 10/21/2018  Medication Sig  . aspirin 325 MG tablet Take 1 tablet (325 mg total) by mouth daily.  Marland Kitchen atorvastatin (LIPITOR) 40 MG tablet Take 1 tablet (40 mg total) by mouth daily at 6 PM for 30 days.  . clopidogrel (PLAVIX) 75 MG tablet Take 1 tablet (75 mg total) by mouth daily for 30 days.  . diazepam (VALIUM) 2 MG tablet TAKE 1 TABLET EVERY 8 HOURS AS NEEDED FOR ANXIETY (Patient taking differently: Take 2 mg by mouth daily as needed. )  . fluticasone (FLONASE) 50 MCG/ACT nasal spray Place 1 spray into both nostrils daily as needed for allergies. For allergies   . losartan (COZAAR) 100 MG tablet Take 1 tablet (100 mg total) by mouth daily.  . Omega-3 Fatty Acids (FISH OIL) 1200 MG CAPS Take 3  capsules (3,600 mg total) by mouth daily. (Patient taking differently: Take 2 capsules by mouth daily. )  . omeprazole (PRILOSEC) 20 MG capsule TAKE ONE (1) CAPSULE EACH DAY (Patient taking differently: Take 20 mg by mouth daily. TAKE ONE (1) CAPSULE EACH DAY)  . Polyethyl Glycol-Propyl Glycol (SYSTANE OP) Apply 1 drop to eye daily.    ACTIVITIES OF DAILY LIVING  Patient is able to perform ADLs independently and this is is not a change from baseline.  Her son and daughter are staying with her in her home and assisting with any needs that she has. Patient is not receiving home health services at this time.    PLAN  Continue to take all medications as prescribed . Call back as soon as possible to schedule an appointment with your PCP within the next 1 to 2 weeks . Schedule an appointment with Dr Merlene Laughter within the next 2 weeks. We can assist with this if necessary.  . Seek emergency medical care if you develop any s/s of of a stroke . Move carefully to avoid falls and report any trauma or s/s of bleeding   Chong Sicilian, RN-BC, BSN Nurse Case Manager [place] Ph: [phone]

## 2018-10-23 ENCOUNTER — Telehealth: Payer: Self-pay | Admitting: Nurse Practitioner

## 2018-10-23 NOTE — Telephone Encounter (Signed)
Appointment scheduled for next Tuesday at 8:30 am

## 2018-10-28 ENCOUNTER — Other Ambulatory Visit: Payer: Self-pay | Admitting: Nurse Practitioner

## 2018-10-28 DIAGNOSIS — K219 Gastro-esophageal reflux disease without esophagitis: Secondary | ICD-10-CM

## 2018-10-29 ENCOUNTER — Ambulatory Visit (INDEPENDENT_AMBULATORY_CARE_PROVIDER_SITE_OTHER): Payer: Medicare Other | Admitting: Nurse Practitioner

## 2018-10-29 ENCOUNTER — Telehealth: Payer: Self-pay

## 2018-10-29 ENCOUNTER — Encounter: Payer: Self-pay | Admitting: Nurse Practitioner

## 2018-10-29 VITALS — BP 131/72 | HR 90 | Temp 97.2°F | Ht 65.0 in | Wt 143.0 lb

## 2018-10-29 DIAGNOSIS — Z9189 Other specified personal risk factors, not elsewhere classified: Secondary | ICD-10-CM

## 2018-10-29 DIAGNOSIS — I6300 Cerebral infarction due to thrombosis of unspecified precerebral artery: Secondary | ICD-10-CM

## 2018-10-29 DIAGNOSIS — I6930 Unspecified sequelae of cerebral infarction: Secondary | ICD-10-CM

## 2018-10-29 DIAGNOSIS — Z09 Encounter for follow-up examination after completed treatment for conditions other than malignant neoplasm: Secondary | ICD-10-CM

## 2018-10-29 DIAGNOSIS — IMO0001 Reserved for inherently not codable concepts without codable children: Secondary | ICD-10-CM

## 2018-10-29 NOTE — Telephone Encounter (Signed)
I need a referral for Dr Merlene Laughter Neurologist in [place] for F/U

## 2018-10-29 NOTE — Patient Instructions (Signed)

## 2018-10-29 NOTE — Addendum Note (Signed)
Addended by: [name] on: 10/29/2018 04:30 PM   Modules accepted: Orders

## 2018-10-29 NOTE — Progress Notes (Signed)
   Subjective:    Patient ID: [name], female    DOB: 05/19/1933, 85 y.o.   MRN: [id number]   Chief Complaint: transitional care  HPI Today's visit is for Transitional Care Management.  The patient was discharged from [facility] on 10/19/18 with a primary diagnosis of acute CVA.   Contact with the patient and/or caregiver, by a clinical staff member, was made on 10/21/18 and was documented as a telephone encounter within the EMR.  Through chart review and discussion with the patient I have determined that management of their condition is of low complexity. He has follow up appointment with dr. Merlene Laughter in 2 weeks. Her estrogen was discontinued and she was placed on plavix and ASA daily.  Patient come sin today and is doing well. Has no residual effect from stroke. Denies any blurred vision, not trouble swallowing. She has an occasional headache which is no unusual for her. Her only complaint today is trouble falling back to sleep once she wakes up. She will occasionally take a 1/2 valium andn that will help.       Review of Systems  Constitutional: Negative.   HENT: Negative.   Eyes: Negative.   Respiratory: Negative.   Cardiovascular: Negative.   Gastrointestinal: Negative.   Genitourinary: Negative.   Musculoskeletal: Negative.   Neurological: Negative.   Psychiatric/Behavioral: Negative.   All other systems reviewed and are negative.      Objective:   Physical Exam Vitals signs and nursing note reviewed.  Constitutional:      General: She is not in acute distress.    Appearance: Normal appearance. She is normal weight.  Neck:     Musculoskeletal: Normal range of motion.  Cardiovascular:     Rate and Rhythm: Normal rate and regular rhythm.     Heart sounds: Normal heart sounds.  Pulmonary:     Effort: Pulmonary effort is normal.     Breath sounds: Normal breath sounds.  Abdominal:     General: Abdomen is flat.     Palpations: Abdomen is soft.  Skin:  General: Skin is warm and dry.  Neurological:     General: No focal deficit present.     Mental Status: She is alert and oriented to person, place, and time. Mental status is at baseline.     Cranial Nerves: No cranial nerve deficit.     Sensory: No sensory deficit.     Motor: No weakness.     Gait: Gait normal.  Psychiatric:        Mood and Affect: Mood normal.        Behavior: Behavior normal.    BP 131/72   Pulse 90   Temp (!) 97.2 F (36.2 C) (Oral)   Ht 5\' 5"  (1.651 m)   Wt 143 lb (64.9 kg)   BMI 23.80 kg/m         Assessment & Plan:  [name] in today with chief complaint of No chief complaint on file.   1. Cerebrovascular accident (CVA) due to thrombosis of precerebral artery ([place])  2. Hospital discharge follow-up  3. Transition of care performed with sharing of clinical summary  Discussed the importance of early care with stroke Continue plavix as rx Keep follow up appointment with dr. Annitta Needs, FNP

## 2018-11-11 DIAGNOSIS — E7849 Other hyperlipidemia: Secondary | ICD-10-CM | POA: Diagnosis not present

## 2018-11-11 DIAGNOSIS — G466 Pure sensory lacunar syndrome: Secondary | ICD-10-CM | POA: Diagnosis not present

## 2018-11-11 DIAGNOSIS — I10 Essential (primary) hypertension: Secondary | ICD-10-CM | POA: Diagnosis not present

## 2018-11-11 DIAGNOSIS — R203 Hyperesthesia: Secondary | ICD-10-CM | POA: Diagnosis not present

## 2018-11-12 ENCOUNTER — Other Ambulatory Visit: Payer: Self-pay | Admitting: Nurse Practitioner

## 2019-01-02 ENCOUNTER — Other Ambulatory Visit: Payer: Self-pay | Admitting: Nurse Practitioner

## 2019-01-02 DIAGNOSIS — K219 Gastro-esophageal reflux disease without esophagitis: Secondary | ICD-10-CM

## 2019-02-04 DIAGNOSIS — J343 Hypertrophy of nasal turbinates: Secondary | ICD-10-CM | POA: Diagnosis not present

## 2019-02-04 DIAGNOSIS — J342 Deviated nasal septum: Secondary | ICD-10-CM | POA: Diagnosis not present

## 2019-02-04 DIAGNOSIS — R430 Anosmia: Secondary | ICD-10-CM | POA: Diagnosis not present

## 2019-02-06 ENCOUNTER — Encounter: Payer: Self-pay | Admitting: Nurse Practitioner

## 2019-02-06 ENCOUNTER — Ambulatory Visit (INDEPENDENT_AMBULATORY_CARE_PROVIDER_SITE_OTHER): Payer: Medicare Other | Admitting: Nurse Practitioner

## 2019-02-06 DIAGNOSIS — I639 Cerebral infarction, unspecified: Secondary | ICD-10-CM | POA: Diagnosis not present

## 2019-02-06 DIAGNOSIS — K219 Gastro-esophageal reflux disease without esophagitis: Secondary | ICD-10-CM | POA: Diagnosis not present

## 2019-02-06 DIAGNOSIS — E785 Hyperlipidemia, unspecified: Secondary | ICD-10-CM | POA: Diagnosis not present

## 2019-02-06 DIAGNOSIS — F411 Generalized anxiety disorder: Secondary | ICD-10-CM

## 2019-02-06 DIAGNOSIS — R739 Hyperglycemia, unspecified: Secondary | ICD-10-CM

## 2019-02-06 DIAGNOSIS — M8588 Other specified disorders of bone density and structure, other site: Secondary | ICD-10-CM

## 2019-02-06 DIAGNOSIS — I10 Essential (primary) hypertension: Secondary | ICD-10-CM | POA: Diagnosis not present

## 2019-02-06 MED ORDER — OMEPRAZOLE 20 MG PO CPDR: 20.0000 mg | DELAYED_RELEASE_CAPSULE | Freq: Every day | ORAL | 1 refills | Status: DC

## 2019-02-06 MED ORDER — LOSARTAN POTASSIUM 100 MG PO TABS: 100.0000 mg | ORAL_TABLET | Freq: Every day | ORAL | 1 refills | Status: DC

## 2019-02-06 MED ORDER — ATORVASTATIN CALCIUM 40 MG PO TABS: ORAL_TABLET | ORAL | 1 refills | Status: DC

## 2019-02-06 NOTE — Progress Notes (Signed)
Virtual Visit via telephone Note  I connected with Christina Silva on 02/06/19 at 9:30 AM by telephone and verified that I am speaking with the correct person using two identifiers. Christina Silva is currently located at home  and no one is currently with her during visit. The provider, Mary-Margaret Hassell Done, FNP is located in their office at time of visit.  I discussed the limitations, risks, security and privacy concerns of performing an evaluation and management service by telephone and the availability of in person appointments. I also discussed with the patient that there may be a patient responsible charge related to this service. The patient expressed understanding and agreed to proceed.   History and Present Illness:   Chief Complaint: Medical Management of Chronic Issues    HPI:  1. Essential hypertension No c/o chest pain, sob or headache. Does not check blood pressure at home. BP Readings from Last 3 Encounters:  10/29/18 131/72  10/19/18 132/71  09/26/18 (!) 159/82     2. Hyperlipidemia with target LDL less than 100 Does not eat a lot of fried foods but other then that does not watch diet. No exercise but stays busy  3. Hyperglycemia Does not watch carbs in diet  4. Acute CVA (cerebrovascular accident) ([place])  Is on daily ASA. Only residual effects with taste and smell. Saw ENT and was told there was nothing could do.                                                                                            5. Gastroesophageal reflux disease without esophagitis Is on omeprazole daily and works well  To keep symptoms under control  6. Osteopenia of spine Last dexascan was done with tscore of -1.6. Has occasional back pain  7. Anxiety state Is on valium as needed. Takes couple times a week.    Outpatient Encounter Medications as of 02/06/2019  Medication Sig  . aspirin 325 MG tablet Take 1 tablet (325 mg total) by mouth daily.  Marland Kitchen atorvastatin (LIPITOR) 40  MG tablet TAKE 1 TABLET DAILY AT 6PM  . diazepam (VALIUM) 2 MG tablet TAKE 1 TABLET EVERY 8 HOURS AS NEEDED FOR ANXIETY (Patient taking differently: Take 2 mg by mouth daily as needed. )  . fluticasone (FLONASE) 50 MCG/ACT nasal spray Place 1 spray into both nostrils daily as needed for allergies. For allergies   . losartan (COZAAR) 100 MG tablet Take 1 tablet (100 mg total) by mouth daily.  . Omega-3 Fatty Acids (FISH OIL) 1200 MG CAPS Take 3 capsules (3,600 mg total) by mouth daily. (Patient taking differently: Take 2 capsules by mouth daily. )  . omeprazole (PRILOSEC) 20 MG capsule TAKE ONE (1) CAPSULE EACH DAY  . Polyethyl Glycol-Propyl Glycol (SYSTANE OP) Apply 1 drop to eye daily.     Past Surgical History:  Procedure Laterality Date  . ABDOMINAL HYSTERECTOMY  1980  . CATARACT EXTRACTION Right   . EYE SURGERY    . KNEE ARTHROSCOPY Left   . ROTATOR CUFF REPAIR Left   . TONSILLECTOMY      Family History  Problem Relation  Age of Onset  . Heart attack Mother   . Hyperlipidemia Mother   . Transient ischemic attack Father   . Psychiatric Illness Sister   . Epilepsy Brother     New complaints: None today  Social history: Lives by herself since her husband died. Daughter and son check on her daily.      Review of Systems  Constitutional: Negative for diaphoresis and weight loss.  Eyes: Negative for blurred vision, double vision and pain.  Respiratory: Negative for shortness of breath.   Cardiovascular: Negative for chest pain, palpitations, orthopnea and leg swelling.  Gastrointestinal: Negative for abdominal pain.  Skin: Negative for rash.  Neurological: Negative for dizziness, sensory change, loss of consciousness, weakness and headaches.  Endo/Heme/Allergies: Negative for polydipsia. Does not bruise/bleed easily.  Psychiatric/Behavioral: Negative for memory loss. The patient does not have insomnia.   All other systems reviewed and are negative.     Observations/Objective: Alert and oriented- answers all questions appropriately No distress  Assessment and Plan: Christina Silva comes in today with chief complaint of Medical Management of Chronic Issues   Diagnosis and orders addressed:  1. Essential hypertension Low sodium diet - losartan (COZAAR) 100 MG tablet; Take 1 tablet (100 mg total) by mouth daily.  Dispense: 90 tablet; Refill: 1  2. Hyperlipidemia with target LDL less than 100 Low fat diet - atorvastatin (LIPITOR) 40 MG tablet; TAKE 1 TABLET DAILY AT 6PM  Dispense: 90 tablet; Refill: 1  3. Hyperglycemia Continue to watch carb sin diet  4. Acute CVA (cerebrovascular accident) ([place])  5. Gastroesophageal reflux disease without esophagitis Avoid spicy foods Do not eat 2 hours prior to bedtime - omeprazole (PRILOSEC) 20 MG capsule; Take 1 capsule (20 mg total) by mouth daily.  Dispense: 90 capsule; Refill: 1  6. Osteopenia of spine Weight bearing exercise encouraged  7. Anxiety state Stress management   Labs pending Health Maintenance reviewed Diet and exercise encouraged   Follow Up Instructions: 3 month    I discussed the assessment and treatment plan with the patient. The patient was provided an opportunity to ask questions and all were answered. The patient agreed with the plan and demonstrated an understanding of the instructions.   The patient was advised to call back or seek an in-person evaluation if the symptoms worsen or if the condition fails to improve as anticipated.  The above assessment and management plan was discussed with the patient. The patient verbalized understanding of and has agreed to the management plan. Patient is aware to call the clinic if symptoms persist or worsen. Patient is aware when to return to the clinic for a follow-up visit. Patient educated on when it is appropriate to go to the emergency department.   Time call ended:  9:50 am  I provided 20 minutes of  non-face-to-face time during this encounter.    Mary-Margaret Hassell Done, FNP

## 2019-02-17 ENCOUNTER — Ambulatory Visit: Payer: Medicare Other | Admitting: Nurse Practitioner

## 2019-03-05 ENCOUNTER — Other Ambulatory Visit: Payer: Self-pay | Admitting: Nurse Practitioner

## 2019-03-05 DIAGNOSIS — I10 Essential (primary) hypertension: Secondary | ICD-10-CM

## 2019-03-19 ENCOUNTER — Encounter (HOSPITAL_COMMUNITY): Payer: Self-pay | Admitting: Emergency Medicine

## 2019-03-19 ENCOUNTER — Emergency Department (HOSPITAL_COMMUNITY): Payer: Medicare Other

## 2019-03-19 ENCOUNTER — Other Ambulatory Visit: Payer: Self-pay

## 2019-03-19 ENCOUNTER — Emergency Department (HOSPITAL_COMMUNITY)
Admission: EM | Admit: 2019-03-19 | Discharge: 2019-03-19 | Disposition: A | Discharge status: Home / Self Care | Payer: Medicare Other | Attending: Emergency Medicine | Admitting: Emergency Medicine

## 2019-03-19 DIAGNOSIS — R42 Dizziness and giddiness: Secondary | ICD-10-CM | POA: Diagnosis not present

## 2019-03-19 DIAGNOSIS — Z87891 Personal history of nicotine dependence: Secondary | ICD-10-CM | POA: Insufficient documentation

## 2019-03-19 DIAGNOSIS — G459 Transient cerebral ischemic attack, unspecified: Secondary | ICD-10-CM | POA: Insufficient documentation

## 2019-03-19 DIAGNOSIS — I10 Essential (primary) hypertension: Secondary | ICD-10-CM | POA: Diagnosis not present

## 2019-03-19 DIAGNOSIS — Z7902 Long term (current) use of antithrombotics/antiplatelets: Secondary | ICD-10-CM | POA: Diagnosis not present

## 2019-03-19 DIAGNOSIS — Z7982 Long term (current) use of aspirin: Secondary | ICD-10-CM | POA: Insufficient documentation

## 2019-03-19 DIAGNOSIS — Z79899 Other long term (current) drug therapy: Secondary | ICD-10-CM | POA: Diagnosis not present

## 2019-03-19 DIAGNOSIS — I6782 Cerebral ischemia: Secondary | ICD-10-CM | POA: Diagnosis not present

## 2019-03-19 DIAGNOSIS — Z8673 Personal history of transient ischemic attack (TIA), and cerebral infarction without residual deficits: Secondary | ICD-10-CM | POA: Insufficient documentation

## 2019-03-19 DIAGNOSIS — M79602 Pain in left arm: Secondary | ICD-10-CM | POA: Diagnosis not present

## 2019-03-19 LAB — CBC WITH DIFFERENTIAL/PLATELET
Abs Immature Granulocytes: 0.01 10*3/uL (ref 0.00–0.07)
Basophils Absolute: 0 10*3/uL (ref 0.0–0.1)
Basophils Relative: 1 %
Eosinophils Absolute: 0.1 10*3/uL (ref 0.0–0.5)
Eosinophils Relative: 2 %
HCT: 42.3 % (ref 36.0–46.0)
Hemoglobin: 14 g/dL (ref 12.0–15.0)
Immature Granulocytes: 0 %
Lymphocytes Relative: 32 %
Lymphs Abs: 1.6 10*3/uL (ref 0.7–4.0)
MCH: 30.4 pg (ref 26.0–34.0)
MCHC: 33.1 g/dL (ref 30.0–36.0)
MCV: 92 fL (ref 80.0–100.0)
Monocytes Absolute: 0.6 10*3/uL (ref 0.1–1.0)
Monocytes Relative: 11 %
Neutro Abs: 2.7 10*3/uL (ref 1.7–7.7)
Neutrophils Relative %: 54 %
Platelets: 256 10*3/uL (ref 150–400)
RBC: 4.6 MIL/uL (ref 3.87–5.11)
RDW: 11.9 % (ref 11.5–15.5)
WBC: 5 10*3/uL (ref 4.0–10.5)
nRBC: 0 % (ref 0.0–0.2)

## 2019-03-19 LAB — COMPREHENSIVE METABOLIC PANEL
ALT: 23 U/L (ref 0–44)
AST: 23 U/L (ref 15–41)
Albumin: 3.8 g/dL (ref 3.5–5.0)
Alkaline Phosphatase: 75 U/L (ref 38–126)
Anion gap: 9 (ref 5–15)
BUN: 12 mg/dL (ref 8–23)
CO2: 24 mmol/L (ref 22–32)
Calcium: 9.7 mg/dL (ref 8.9–10.3)
Chloride: 107 mmol/L (ref 98–111)
Creatinine, Ser: 0.59 mg/dL (ref 0.44–1.00)
GFR calc Af Amer: >60 mL/min (ref >60)
GFR calc non Af Amer: >60 mL/min (ref >60)
Glucose, Bld: 97 mg/dL (ref 70–99)
Potassium: 3.9 mmol/L (ref 3.5–5.1)
Sodium: 140 mmol/L (ref 135–145)
Total Bilirubin: 0.6 mg/dL (ref 0.3–1.2)
Total Protein: 6.8 g/dL (ref 6.5–8.1)

## 2019-03-19 MED ORDER — CLOPIDOGREL BISULFATE 75 MG PO TABS: 75.0000 mg | ORAL_TABLET | Freq: Every day | ORAL | 1 refills | Status: DC

## 2019-03-19 MED ORDER — CLOPIDOGREL BISULFATE 75 MG PO TABS
75.0000 mg | ORAL_TABLET | Freq: Once | ORAL | Status: AC
  Administered 2019-03-19: 75 mg via ORAL
  Filled 2019-03-19: qty 1

## 2019-03-19 NOTE — Discharge Instructions (Addendum)
Follow-up with Dr. Merlene Laughter in 2 weeks.  Return if any problems.  Start taking the medicine tomorrow

## 2019-03-19 NOTE — ED Triage Notes (Signed)
Pt reports left arm pain at 0430 this morning.  Woke up this morning at 0700. At 0715 this morning, pt reported bilateral blurred vision with a mild headache.    LKW 2100.  Pt denies any symptoms at this time.

## 2019-03-19 NOTE — ED Provider Notes (Signed)
[facility] EMERGENCY DEPARTMENT Provider Note   CSN: [id number] Arrival date & time: 03/19/19  5053     History   Chief Complaint Chief Complaint  Patient presents with  . Blurred Vision    HPI Christina Silva is a 85 y.o. female.     Patient states that she had some dizziness today that lasted about 15 minutes.  She has had similar episodes before and they thought maybe this was TIA related.  She had a stroke in February with some residual difficulty with smelling.  Patient feels back to normal now  The history is provided by the patient. No language interpreter was used.  Dizziness Quality:  Lightheadedness Severity:  Mild Onset quality:  Sudden Duration: 15 minutes. Timing:  Intermittent Progression:  Resolved Chronicity:  New Context: bending over   Relieved by:  Nothing Worsened by:  Nothing Associated symptoms: no chest pain, no diarrhea and no headaches     Past Medical History:  Diagnosis Date  . Anxiety   . Cataract   . GERD (gastroesophageal reflux disease)   . Hyperlipidemia   . Hypertension   . Vertigo     Patient Active Problem List   Diagnosis Date Noted  . Hyperglycemia 10/18/2018  . Acute CVA (cerebrovascular accident) ([place]) 10/18/2018  . Osteopenia 05/24/2015  . GERD (gastroesophageal reflux disease) 01/08/2013  . Hyperlipidemia with target LDL less than 100 01/08/2013  . HTN (hypertension) 01/08/2013  . Anxiety state 01/08/2013    Past Surgical History:  Procedure Laterality Date  . ABDOMINAL HYSTERECTOMY  1980  . CATARACT EXTRACTION Right   . EYE SURGERY    . KNEE ARTHROSCOPY Left   . ROTATOR CUFF REPAIR Left   . TONSILLECTOMY       OB History   No obstetric history on file.      Home Medications    Prior to Admission medications   Medication Sig Start Date End Date Taking? Authorizing Provider  aspirin 325 MG tablet Take 1 tablet (325 mg total) by mouth daily. 10/20/18  Yes Johnson, Clanford L, MD  Cyanocobalamin  (VITAMIN B 12 PO) Take 1 tablet by mouth daily.   Yes [provider]  diazepam (VALIUM) 2 MG tablet TAKE 1 TABLET EVERY 8 HOURS AS NEEDED FOR ANXIETY Patient taking differently: Take 2 mg by mouth daily as needed.  10/10/18  Yes Martin, Mary-Margaret, FNP  fluticasone (FLONASE) 50 MCG/ACT nasal spray Place 1 spray into both nostrils daily as needed for allergies. For allergies    Yes [provider]  losartan (COZAAR) 100 MG tablet Take 1 tablet (100 mg total) by mouth daily. 02/06/19  Yes Hassell Done, Mary-Margaret, FNP  Omega-3 Fatty Acids (FISH OIL) 1200 MG CAPS Take 3 capsules (3,600 mg total) by mouth daily. Patient taking differently: Take 2 capsules by mouth daily.  05/24/15  Yes Cherre Robins, PharmD  omeprazole (PRILOSEC) 20 MG capsule Take 1 capsule (20 mg total) by mouth daily. 02/06/19  Yes Hassell Done, Mary-Margaret, FNP  Polyethyl Glycol-Propyl Glycol (SYSTANE OP) Apply 1 drop to eye daily.   Yes [provider]  atorvastatin (LIPITOR) 40 MG tablet TAKE 1 TABLET DAILY AT 6PM Patient not taking: Reported on 03/19/2019 02/06/19   [name], FNP  clopidogrel (PLAVIX) 75 MG tablet Take 1 tablet (75 mg total) by mouth daily. 03/19/19   Milton Ferguson, MD    Family History Family History  Problem Relation Age of Onset  . Heart attack Mother   . Hyperlipidemia Mother   .  Transient ischemic attack Father   . Psychiatric Illness Sister   . Epilepsy Brother     Social History Social History   Tobacco Use  . Smoking status: Former Smoker    Types: Cigarettes    Quit date: 01/09/1983    Years since quitting: 36.2  . Smokeless tobacco: Never Used  Substance Use Topics  . Alcohol use: No  . Drug use: No     Allergies   Codeine, Lisinopril, Morphine, Sulfa antibiotics, and Sulfonamide derivatives   Review of Systems Review of Systems  Constitutional: Negative for appetite change and fatigue.  HENT: Negative for congestion, ear discharge and sinus  pressure.   Eyes: Negative for discharge.  Respiratory: Negative for cough.   Cardiovascular: Negative for chest pain.  Gastrointestinal: Negative for abdominal pain and diarrhea.  Genitourinary: Negative for frequency and hematuria.  Musculoskeletal: Negative for back pain.  Skin: Negative for rash.  Neurological: Positive for dizziness. Negative for seizures and headaches.  Psychiatric/Behavioral: Negative for hallucinations.     Physical Exam Updated Vital Signs BP 133/81   Pulse 64   Temp 98 F (36.7 C) (Oral)   Resp 15   Ht 5\' 5"  (1.651 m)   Wt 63.5 kg   SpO2 97%   BMI 23.30 kg/m   Physical Exam Vitals signs and nursing note reviewed.  Constitutional:      Appearance: She is well-developed.  HENT:     Head: Normocephalic.     Nose: Nose normal.  Eyes:     General: No scleral icterus.    Conjunctiva/sclera: Conjunctivae normal.  Neck:     Musculoskeletal: Neck supple.     Thyroid: No thyromegaly.  Cardiovascular:     Rate and Rhythm: Normal rate and regular rhythm.     Heart sounds: No murmur. No friction rub. No gallop.   Pulmonary:     Breath sounds: No stridor. No wheezing or rales.  Chest:     Chest wall: No tenderness.  Abdominal:     General: There is no distension.     Tenderness: There is no abdominal tenderness. There is no rebound.  Musculoskeletal: Normal range of motion.  Lymphadenopathy:     Cervical: No cervical adenopathy.  Skin:    Findings: No erythema or rash.  Neurological:     Mental Status: She is oriented to person, place, and time.     Motor: No abnormal muscle tone.     Coordination: Coordination normal.  Psychiatric:        Behavior: Behavior normal.      ED Treatments / Results  Labs (all labs ordered are listed, but only abnormal results are displayed) Labs Reviewed  CBC WITH DIFFERENTIAL/PLATELET  COMPREHENSIVE METABOLIC PANEL    EKG None  Radiology Ct Head Wo Contrast  Result Date: 03/19/2019 CLINICAL DATA:   Onset left arm pain at 4:30 a.m. this morning. EXAM: CT HEAD WITHOUT CONTRAST TECHNIQUE: Contiguous axial images were obtained from the base of the skull through the vertex without intravenous contrast. COMPARISON:  Brain MRI and head CT scan 10/18/2018. FINDINGS: Brain: No evidence of acute infarction, hemorrhage, hydrocephalus, extra-axial collection or mass lesion/mass effect. Atrophy, chronic microvascular ischemic change and small, remote infarct in the right basal ganglia and centrum semiovale noted. Vascular: No hyperdense vessel or unexpected calcification. Skull: Intact.  No focal lesion. Sinuses/Orbits: Status post cataract surgery.  Otherwise negative. Other: None. IMPRESSION: No acute abnormality. Atrophy and chronic microvascular ischemic change. Electronically Signed   By: Marcello Moores  Dalessio M.D.   On: 03/19/2019 10:39    Procedures Procedures (including critical care time)  Medications Ordered in ED Medications  clopidogrel (PLAVIX) tablet 75 mg (has no administration in time range)     Initial Impression / Assessment and Plan / ED Course  I have reviewed the triage vital signs and the nursing notes.  Pertinent labs & imaging results that were available during my care of the patient were reviewed by me and considered in my medical decision making (see chart for details).        Labs and CT scan unremarkable.  I spoke with her neurologist Dr. Merlene Laughter.  And he recommends starting the patient on Plavix and he will see her back in 2 weeks  Final Clinical Impressions(s) / ED Diagnoses   Final diagnoses:  TIA (transient ischemic attack)    ED Discharge Orders         Ordered    clopidogrel (PLAVIX) 75 MG tablet  Daily     03/19/19 1159           Milton Ferguson, MD 03/19/19 1203

## 2019-03-19 NOTE — ED Notes (Signed)
Dr. Merlene Laughter paged to [phone].

## 2019-03-20 ENCOUNTER — Telehealth: Payer: Self-pay | Admitting: Nurse Practitioner

## 2019-03-20 ENCOUNTER — Other Ambulatory Visit: Payer: Self-pay | Admitting: Nurse Practitioner

## 2019-03-20 DIAGNOSIS — F411 Generalized anxiety disorder: Secondary | ICD-10-CM

## 2019-03-20 NOTE — Telephone Encounter (Signed)
Patient would like to speak with you directly.

## 2019-03-24 NOTE — Telephone Encounter (Signed)
Is okay to take a baby aspirin with plavix.- patient aware

## 2019-04-02 ENCOUNTER — Telehealth: Payer: Self-pay | Admitting: Nurse Practitioner

## 2019-04-02 DIAGNOSIS — R739 Hyperglycemia, unspecified: Secondary | ICD-10-CM

## 2019-04-02 DIAGNOSIS — I10 Essential (primary) hypertension: Secondary | ICD-10-CM

## 2019-04-02 DIAGNOSIS — G43119 Migraine with aura, intractable, without status migrainosus: Secondary | ICD-10-CM | POA: Diagnosis not present

## 2019-04-02 DIAGNOSIS — E7849 Other hyperlipidemia: Secondary | ICD-10-CM | POA: Diagnosis not present

## 2019-04-02 DIAGNOSIS — G466 Pure sensory lacunar syndrome: Secondary | ICD-10-CM | POA: Diagnosis not present

## 2019-04-02 DIAGNOSIS — K210 Gastro-esophageal reflux disease with esophagitis: Secondary | ICD-10-CM | POA: Diagnosis not present

## 2019-04-02 DIAGNOSIS — E785 Hyperlipidemia, unspecified: Secondary | ICD-10-CM

## 2019-04-02 NOTE — Telephone Encounter (Signed)
Labs ordered and daughter notified

## 2019-04-03 ENCOUNTER — Encounter: Payer: Self-pay | Admitting: Family Medicine

## 2019-04-03 ENCOUNTER — Ambulatory Visit (INDEPENDENT_AMBULATORY_CARE_PROVIDER_SITE_OTHER): Payer: Medicare Other | Admitting: Family Medicine

## 2019-04-03 ENCOUNTER — Other Ambulatory Visit: Payer: Self-pay

## 2019-04-03 DIAGNOSIS — L239 Allergic contact dermatitis, unspecified cause: Secondary | ICD-10-CM

## 2019-04-03 MED ORDER — PREDNISONE 10 MG (21) PO TBPK: ORAL_TABLET | ORAL | 0 refills | Status: DC

## 2019-04-03 MED ORDER — CETIRIZINE HCL 10 MG PO TABS: 10.0000 mg | ORAL_TABLET | Freq: Every day | ORAL | 0 refills | Status: DC

## 2019-04-03 NOTE — Patient Instructions (Signed)
Contact Dermatitis Dermatitis is redness, soreness, and swelling (inflammation) of the skin. Contact dermatitis is a reaction to something that touches the skin. There are two types of contact dermatitis:  Irritant contact dermatitis. This happens when something bothers (irritates) your skin, like soap.  Allergic contact dermatitis. This is caused when you are exposed to something that you are allergic to, such as poison ivy. What are the causes?  Common causes of irritant contact dermatitis include: ? Makeup. ? Soaps. ? Detergents. ? Bleaches. ? Acids. ? Metals, such as nickel.  Common causes of allergic contact dermatitis include: ? Plants. ? Chemicals. ? Jewelry. ? Latex. ? Medicines. ? Preservatives in products, such as clothing. What increases the risk?  Having a job that exposes you to things that bother your skin.  Having asthma or eczema. What are the signs or symptoms? Symptoms may happen anywhere the irritant has touched your skin. Symptoms include:  Dry or flaky skin.  Redness.  Cracks.  Itching.  Pain or a burning feeling.  Blisters.  Blood or clear fluid draining from skin cracks. With allergic contact dermatitis, swelling may occur. This may happen in places such as the eyelids, mouth, or genitals. How is this treated?  This condition is treated by checking for the cause of the reaction and protecting your skin. Treatment may also include: ? Steroid creams, ointments, or medicines. ? Antibiotic medicines or other ointments, if you have a skin infection. ? Lotion or medicines to help with itching. ? A bandage (dressing). Follow these instructions at home: Skin care  Moisturize your skin as needed.  Put cool cloths on your skin.  Put a baking soda paste on your skin. Stir water into baking soda until it looks like a paste.  Do not scratch your skin.  Avoid having things rub up against your skin.  Avoid the use of soaps, perfumes, and dyes.  Medicines  Take or apply over-the-counter and prescription medicines only as told by your doctor.  If you were prescribed an antibiotic medicine, take or apply it as told by your doctor. Do not stop using it even if your condition starts to get better. Bathing  Take a bath with: ? Epsom salts. ? Baking soda. ? Colloidal oatmeal.  Bathe less often.  Bathe in warm water. Avoid using hot water. Bandage care  If you were given a bandage, change it as told by your health care provider.  Wash your hands with soap and water before and after you change your bandage. If soap and water are not available, use hand sanitizer. General instructions  Avoid the things that caused your reaction. If you do not know what caused it, keep a journal. Write down: ? What you eat. ? What skin products you use. ? What you drink. ? What you wear in the area that has symptoms. This includes jewelry.  Check the affected areas every day for signs of infection. Check for: ? More redness, swelling, or pain. ? More fluid or blood. ? Warmth. ? Pus or a bad smell.  Keep all follow-up visits as told by your doctor. This is important. Contact a doctor if:  You do not get better with treatment.  Your condition gets worse.  You have signs of infection, such as: ? More swelling. ? Tenderness. ? More redness. ? Soreness. ? Warmth.  You have a fever.  You have new symptoms. Get help right away if:  You have a very bad headache.  You have neck pain.    Your neck is stiff.  You throw up (vomit).  You feel very sleepy.  You see red streaks coming from the area.  Your bone or joint near the area hurts after the skin has healed.  The area turns darker.  You have trouble breathing. Summary  Dermatitis is redness, soreness, and swelling of the skin.  Symptoms may occur where the irritant has touched you.  Treatment may include medicines and skin care.  If you do not know what caused your  reaction, keep a journal.  Contact a doctor if your condition gets worse or you have signs of infection. This information is not intended to replace advice given to you by your health care provider. Make sure you discuss any questions you have with your health care provider. Document Released: 06/11/2009 Document Revised: 12/04/2018 Document Reviewed: 02/27/2018 Elsevier Patient Education  2020 [employer]  

## 2019-04-03 NOTE — Progress Notes (Signed)
Virtual Visit via Telephone Note  I connected with Christina Silva on 04/03/19 at 9:05 AM by telephone and verified that I am speaking with the correct person using two identifiers. Christina Silva is currently located at home and nobody is currently with her during this visit. The provider, Loman Brooklyn, FNP is located in their home at time of visit.  I discussed the limitations, risks, security and privacy concerns of performing an evaluation and management service by telephone and the availability of in person appointments. I also discussed with the patient that there may be a patient responsible charge related to this service. The patient expressed understanding and agreed to proceed.  Subjective: PCP: [name], FNP  Chief Complaint  Patient presents with  . Allergic Reaction   Patient reports her hands have been itching really bad for the past 3-4 days. She does have red welts on them as well. She reports she had an ER follow-up with her neurologist this week who recommended Cortisone cream. She has been applying this which is helpful for a little bit. She has also been applying Calamine lotion.  She reports she was out pulling weeds prior to the onset of her symptoms. She had also recently sprayed some ant spray under her cabinet but states she was wearing gloves. Today she noticed the welts on her buttocks while in the shower. She denies any new soaps, detergents, fabric softeners, etc.    ROS: Per HPI  Current Outpatient Medications:  .  aspirin 325 MG tablet, Take 1 tablet (325 mg total) by mouth daily., Disp: , Rfl:  .  atorvastatin (LIPITOR) 40 MG tablet, TAKE 1 TABLET DAILY AT 6PM (Patient not taking: Reported on 03/19/2019), Disp: 90 tablet, Rfl: 1 .  cetirizine (ZYRTEC) 10 MG tablet, Take 1 tablet (10 mg total) by mouth daily., Disp: 30 tablet, Rfl: 0 .  clopidogrel (PLAVIX) 75 MG tablet, Take 1 tablet (75 mg total) by mouth daily., Disp: 30 tablet, Rfl: 1  .  Cyanocobalamin (VITAMIN B 12 PO), Take 1 tablet by mouth daily., Disp: , Rfl:  .  diazepam (VALIUM) 2 MG tablet, Take 1 tablet (2 mg total) by mouth daily as needed., Disp: 30 tablet, Rfl: 1 .  esomeprazole (NEXIUM) 40 MG capsule, Take 40 mg by mouth daily., Disp: , Rfl:  .  fluticasone (FLONASE) 50 MCG/ACT nasal spray, Place 1 spray into both nostrils daily as needed for allergies. For allergies , Disp: , Rfl:  .  losartan (COZAAR) 100 MG tablet, Take 1 tablet (100 mg total) by mouth daily., Disp: 90 tablet, Rfl: 1 .  Omega-3 Fatty Acids (FISH OIL) 1200 MG CAPS, Take 3 capsules (3,600 mg total) by mouth daily. (Patient taking differently: Take 2 capsules by mouth daily. ), Disp: , Rfl:  .  Polyethyl Glycol-Propyl Glycol (SYSTANE OP), Apply 1 drop to eye daily., Disp: , Rfl:  .  predniSONE (STERAPRED UNI-PAK 21 TAB) 10 MG (21) TBPK tablet, As directed x 6 days, Disp: 21 tablet, Rfl: 0  Allergies  Allergen Reactions  . Codeine Nausea And Vomiting  . Lisinopril Swelling  . Morphine Nausea And Vomiting  . Sulfa Antibiotics Nausea Only and Rash  . Sulfonamide Derivatives Rash   Past Medical History:  Diagnosis Date  . Anxiety   . Cataract   . GERD (gastroesophageal reflux disease)   . Hyperlipidemia   . Hypertension   . Vertigo     Observations/Objective: A&O  No respiratory distress or wheezing  audible over the phone Mood, judgement, and thought processes all WNL  Assessment and Plan: 1. Allergic dermatitis - Education provided on dermatitis.  - predniSONE (STERAPRED UNI-PAK 21 TAB) 10 MG (21) TBPK tablet; As directed x 6 days  Dispense: 21 tablet; Refill: 0 - cetirizine (ZYRTEC) 10 MG tablet; Take 1 tablet (10 mg total) by mouth daily.  Dispense: 30 tablet; Refill: 0  Follow Up Instructions:  I discussed the assessment and treatment plan with the patient. The patient was provided an opportunity to ask questions and all were answered. The patient agreed with the plan and  demonstrated an understanding of the instructions.   The patient was advised to call back or seek an in-person evaluation if the symptoms worsen or if the condition fails to improve as anticipated.  The above assessment and management plan was discussed with the patient. The patient verbalized understanding of and has agreed to the management plan. Patient is aware to call the clinic if symptoms persist or worsen. Patient is aware when to return to the clinic for a follow-up visit. Patient educated on when it is appropriate to go to the emergency department.   Time call ended: 9:14 AM  I provided 9 minutes of non-face-to-face time during this encounter.  Hendricks Limes, MSN, APRN, FNP-C [facility] 04/03/19

## 2019-04-09 ENCOUNTER — Emergency Department (HOSPITAL_COMMUNITY): Payer: Medicare Other

## 2019-04-09 ENCOUNTER — Encounter (HOSPITAL_COMMUNITY): Payer: Self-pay | Admitting: Emergency Medicine

## 2019-04-09 ENCOUNTER — Other Ambulatory Visit: Payer: Self-pay

## 2019-04-09 ENCOUNTER — Emergency Department (HOSPITAL_COMMUNITY)
Admission: EM | Admit: 2019-04-09 | Discharge: 2019-04-09 | Disposition: A | Discharge status: Home / Self Care | Payer: Medicare Other | Attending: Emergency Medicine | Admitting: Emergency Medicine

## 2019-04-09 ENCOUNTER — Other Ambulatory Visit: Payer: Medicare Other

## 2019-04-09 DIAGNOSIS — R0789 Other chest pain: Secondary | ICD-10-CM | POA: Diagnosis not present

## 2019-04-09 DIAGNOSIS — Z87891 Personal history of nicotine dependence: Secondary | ICD-10-CM | POA: Insufficient documentation

## 2019-04-09 DIAGNOSIS — K8020 Calculus of gallbladder without cholecystitis without obstruction: Secondary | ICD-10-CM | POA: Diagnosis not present

## 2019-04-09 DIAGNOSIS — I10 Essential (primary) hypertension: Secondary | ICD-10-CM

## 2019-04-09 DIAGNOSIS — E785 Hyperlipidemia, unspecified: Secondary | ICD-10-CM | POA: Diagnosis not present

## 2019-04-09 DIAGNOSIS — R739 Hyperglycemia, unspecified: Secondary | ICD-10-CM | POA: Diagnosis not present

## 2019-04-09 DIAGNOSIS — R1011 Right upper quadrant pain: Secondary | ICD-10-CM | POA: Diagnosis not present

## 2019-04-09 DIAGNOSIS — Z7689 Persons encountering health services in other specified circumstances: Secondary | ICD-10-CM | POA: Diagnosis not present

## 2019-04-09 LAB — LIPASE, BLOOD: Lipase: 38 U/L (ref 11–51)

## 2019-04-09 LAB — HEPATIC FUNCTION PANEL
ALT: 21 U/L (ref 0–44)
AST: 20 U/L (ref 15–41)
Albumin: 3.6 g/dL (ref 3.5–5.0)
Alkaline Phosphatase: 84 U/L (ref 38–126)
Bilirubin, Direct: <0.1 mg/dL (ref 0.0–0.2)
Total Bilirubin: 0.5 mg/dL (ref 0.3–1.2)
Total Protein: 6.6 g/dL (ref 6.5–8.1)

## 2019-04-09 LAB — CBC
HCT: 44.2 % (ref 36.0–46.0)
Hemoglobin: 14.6 g/dL (ref 12.0–15.0)
MCH: 30.7 pg (ref 26.0–34.0)
MCHC: 33 g/dL (ref 30.0–36.0)
MCV: 92.9 fL (ref 80.0–100.0)
Platelets: 363 10*3/uL (ref 150–400)
RBC: 4.76 MIL/uL (ref 3.87–5.11)
RDW: 11.8 % (ref 11.5–15.5)
WBC: 9.3 10*3/uL (ref 4.0–10.5)
nRBC: 0 % (ref 0.0–0.2)

## 2019-04-09 LAB — BASIC METABOLIC PANEL
Anion gap: 11 (ref 5–15)
BUN: 12 mg/dL (ref 8–23)
CO2: 25 mmol/L (ref 22–32)
Calcium: 9.4 mg/dL (ref 8.9–10.3)
Chloride: 105 mmol/L (ref 98–111)
Creatinine, Ser: 0.85 mg/dL (ref 0.44–1.00)
GFR calc Af Amer: >60 mL/min (ref >60)
GFR calc non Af Amer: >60 mL/min (ref >60)
Glucose, Bld: 147 mg/dL — ABNORMAL HIGH (ref 70–99)
Potassium: 3.2 mmol/L — ABNORMAL LOW (ref 3.5–5.1)
Sodium: 141 mmol/L (ref 135–145)

## 2019-04-09 LAB — TROPONIN I (HIGH SENSITIVITY)
Troponin I (High Sensitivity): 4 ng/L (ref <18)
Troponin I (High Sensitivity): 5 ng/L (ref <18)

## 2019-04-09 LAB — BAYER DCA HB A1C WAIVED: HB A1C (BAYER DCA - WAIVED): 6.1 % (ref <7.0)

## 2019-04-09 MED ORDER — SODIUM CHLORIDE 0.9% FLUSH: 3.0000 mL | Freq: Once | INTRAVENOUS | Status: DC

## 2019-04-09 MED ORDER — ALUM & MAG HYDROXIDE-SIMETH 400-400-40 MG/5ML PO SUSP: 10.0000 mL | Freq: Four times a day (QID) | ORAL | 0 refills | Status: DC | PRN

## 2019-04-09 MED ORDER — PANTOPRAZOLE SODIUM 20 MG PO TBEC: 20.0000 mg | DELAYED_RELEASE_TABLET | Freq: Every day | ORAL | 0 refills | Status: DC

## 2019-04-09 NOTE — ED Notes (Signed)
Patient verbalizes understanding of discharge instructions. Opportunity for questioning and answers were provided. Armband removed by staff, pt discharged from ED via wheelchair to home.  

## 2019-04-09 NOTE — ED Triage Notes (Signed)
Pt reports 1 hours ago she got out at a department store and suddenly had substernal chest tightness. Pt states she has been having these episodes that have felt like heartburn but today there pain was worse.

## 2019-04-09 NOTE — ED Notes (Signed)
Main lab called. Running hepatic function and lipase now

## 2019-04-09 NOTE — ED Provider Notes (Signed)
Emergency Department Provider Note   I have reviewed the triage vital signs and the nursing notes.   HISTORY  Chief Complaint Chest Pain   HPI Christina Silva is a 85 y.o. female with PMH of CVA, HTN, and HLD presents to the emergency department for evaluation of chest pain.  Patient states that she has had a epigastric/lower chest discomfort that feels like a fullness/pressure sensation.  It has been intermittent over the past 2 weeks and worse at night.  She mentioned this to her PCP who started her on Nexium which she has been taking with no relief.  Today she was shopping and had acute onset epigastric discomfort similar to prior episodes.  Notes that it seems to radiate into her chest and left shoulder.  No active pain at this time.  No prior history of ACS.  No fevers or chills.  She notes that with these episodes she has a lot of belching.    Past Medical History:  Diagnosis Date  . Anxiety   . Cataract   . GERD (gastroesophageal reflux disease)   . Hyperlipidemia   . Hypertension   . Vertigo     Patient Active Problem List   Diagnosis Date Noted  . Hyperglycemia 10/18/2018  . Acute CVA (cerebrovascular accident) ([place]) 10/18/2018  . Chondrocalcinosis of right knee 10/16/2018  . Osteopenia 05/24/2015  . GERD (gastroesophageal reflux disease) 01/08/2013  . Hyperlipidemia with target LDL less than 100 01/08/2013  . HTN (hypertension) 01/08/2013  . Anxiety state 01/08/2013    Past Surgical History:  Procedure Laterality Date  . ABDOMINAL HYSTERECTOMY  1980  . CATARACT EXTRACTION Right   . EYE SURGERY    . KNEE ARTHROSCOPY Left   . ROTATOR CUFF REPAIR Left   . TONSILLECTOMY      Allergies Codeine, Lisinopril, Morphine, Sulfa antibiotics, and Sulfonamide derivatives  Family History  Problem Relation Age of Onset  . Heart attack Mother   . Hyperlipidemia Mother   . Transient ischemic attack Father   . Psychiatric Illness Sister   . Epilepsy Brother     Social History Social History   Tobacco Use  . Smoking status: Former Smoker    Types: Cigarettes    Quit date: 01/09/1983    Years since quitting: 36.2  . Smokeless tobacco: Never Used  Substance Use Topics  . Alcohol use: No  . Drug use: No    Review of Systems  Constitutional: No fever/chills Eyes: No visual changes. ENT: No sore throat. Cardiovascular: Positive chest pain. Respiratory: Denies shortness of breath. Gastrointestinal: Positive epigastric abdominal pain.  No nausea, no vomiting.  No diarrhea.  No constipation. Genitourinary: Negative for dysuria. Musculoskeletal: Negative for back pain. Skin: Negative for rash. Neurological: Negative for headaches, focal weakness or numbness.  10-point ROS otherwise negative.  ____________________________________________   PHYSICAL EXAM:  VITAL SIGNS: ED Triage Vitals [04/09/19 1302]  Enc Vitals Group     BP (!) 180/84     Pulse Rate (!) 104     Resp 16     Temp 97.9 F (36.6 C)     Temp Source Oral     SpO2 95 %   Constitutional: Alert and oriented. Well appearing and in no acute distress. Eyes: Conjunctivae are normal.  Head: Atraumatic. Nose: No congestion/rhinnorhea. Mouth/Throat: Mucous membranes are moist.  Cardiovascular: Tachycardia. Good peripheral circulation. Grossly normal heart sounds.   Respiratory: Normal respiratory effort.  No retractions. Lungs CTAB. Gastrointestinal: Soft with focal epigastric and mild  RUQ discomfort on exam. No rebound or guarding. No distention.  Musculoskeletal: No gross deformities of extremities. Neurologic:  Normal speech and language. Skin:  Skin is warm, dry and intact. No rash noted.  ____________________________________________   LABS (all labs ordered are listed, but only abnormal results are displayed)  Labs Reviewed  BASIC METABOLIC PANEL - Abnormal; Notable for the following components:      Result Value   Potassium 3.2 (*)    Glucose, Bld 147 (*)     All other components within normal limits  CBC  HEPATIC FUNCTION PANEL  LIPASE, BLOOD  TROPONIN I (HIGH SENSITIVITY)  TROPONIN I (HIGH SENSITIVITY)   ____________________________________________  EKG   EKG Interpretation  Date/Time:  Wednesday April 09 2019 12:59:29 EDT Ventricular Rate:  102 PR Interval:  142 QRS Duration: 80 QT Interval:  350 QTC Calculation: 456 R Axis:   27 Text Interpretation:  Sinus tachycardia Low voltage QRS Septal infarct , age undetermined Abnormal ECG No STEMI  Confirmed by Nanda Quinton [id number]) on 04/09/2019 4:06:28 PM Also confirmed by Nanda Quinton [phone]), editor Philomena Doheny [phone])  on 04/10/2019 8:45:03 AM       ____________________________________________  RADIOLOGY  Dg Chest 2 View  Result Date: 04/09/2019 CLINICAL DATA:  Substernal chest tightness, EXAM: CHEST - 2 VIEW COMPARISON:  Comparison radiograph 10/18/2018 FINDINGS: Bandlike opacity in the left lung base compatible with subsegmental atelectasis. The lungs are otherwise clear. No consolidation, features of edema, pneumothorax, or effusion. Pulmonary vascularity is normally distributed. The aorta is calcified and tortuous. The cardiomediastinal contours are otherwise unremarkable. No acute osseous or soft tissue abnormality. Levocurvature of the thoracic spine is similar to prior exams. Degenerative changes are present in the and imaged spine and shoulders. Elevation of the right humeral head may suggest right rotator cuff insufficiency. IMPRESSION: No active cardiopulmonary disease. Electronically Signed   By: Lovena Le M.D.   On: 04/09/2019 13:51   US Abdomen Limited Ruq  Result Date: 04/09/2019 CLINICAL DATA:  RIGHT upper quadrant pain for 1 month EXAM: ULTRASOUND ABDOMEN LIMITED RIGHT UPPER QUADRANT COMPARISON:  None. FINDINGS: Gallbladder: Multiple echogenic gallstones fill the lumen the gallbladder measuring up to 1 cm. Posterior wall shadowing. No gallbladder distention or  pericholecystic fluid. Negative sonographic Murphy's sign. Common bile duct: Diameter: Upper limits of normal at 6 mm Liver: Liver has a nodular contour. There is mild increase in liver echogenicity. No biliary duct dilatation portal vein is patent on color Doppler imaging with normal direction of blood flow towards the liver. Other: No free fluid IMPRESSION: 1. Cholelithiasis without evidence acute cholecystitis. 2. Nodule liver and increased liver echogenicity could represent hepatocellular disease such as cirrhosis. 3. No biliary duct dilatation. Common bile duct upper limits of normal. Electronically Signed   By: Suzy Bouchard M.D.   On: 04/09/2019 17:44    ____________________________________________   PROCEDURES  Procedure(s) performed:   Procedures  None ____________________________________________   INITIAL IMPRESSION / ASSESSMENT AND PLAN / ED COURSE  Pertinent labs & imaging results that were available during my care of the patient were reviewed by me and considered in my medical decision making (see chart for details).   Patient arrives to the emergency department with intermittent chest discomfort over the past 2 days.  Pain is more epigastric when the patient points to where she is experiencing discomfort.  She does have some tenderness in this area as well as the right upper quadrant.  There is associated belching and symptoms are worse at  night.  My suspicion for ACS is fairly low despite the patient having several risk factors and heart score of 6.  Initial troponin was low.  Chest x-ray unremarkable.  I will add on hepatic and biliary function labs as well as right upper quadrant ultrasound.  Repeat troponin is pending.  Hepatic labs and [place] reviewed. Repeat troponin not elevated. Discussed impression with patient. Will start Protonix and Maalox. Discussed f/u plan with PCP, Cardiology, and GI. Also discussed ED return precautions in detail.   ____________________________________________  FINAL CLINICAL IMPRESSION(S) / ED DIAGNOSES  Final diagnoses:  RUQ abdominal pain  Atypical chest pain     NEW OUTPATIENT MEDICATIONS STARTED DURING THIS VISIT:  Discharge Medication List as of 04/09/2019  7:30 PM    START taking these medications   Details  alum & mag hydroxide-simeth (MAALOX MAX) 400-400-40 MG/5ML suspension Take 10 mLs by mouth every 6 (six) hours as needed for indigestion., Starting Wed 04/09/2019, Print    pantoprazole (PROTONIX) 20 MG tablet Take 1 tablet (20 mg total) by mouth daily., Starting Wed 04/09/2019, Until Fri 05/09/2019, Print        Note:  This document was prepared using Dragon voice recognition software and may include unintentional dictation errors.  Nanda Quinton, MD Emergency Medicine    Stirling Orton, [facility], MD 04/10/19 [phone]

## 2019-04-09 NOTE — ED Notes (Signed)
Patient transported to Ultrasound 

## 2019-04-09 NOTE — Discharge Instructions (Signed)
You were seen in the emergency department today with upper abdominal pain chest discomfort.  Your heart tests look normal.  I would like for you to follow with a cardiologist.  Please call the office tomorrow to schedule the next available appointment and return to emergency department if you develop any new or worsening chest pain.   I will have you stop taking the Nexium.  You can take the Protonix daily and the Maalox as needed if you develop symptoms.  Please call your PCP tomorrow to discuss referral to gastroenterology and possible upper endoscopy appointment.

## 2019-04-10 ENCOUNTER — Other Ambulatory Visit: Payer: Self-pay | Admitting: Nurse Practitioner

## 2019-04-10 ENCOUNTER — Telehealth: Payer: Self-pay | Admitting: Nurse Practitioner

## 2019-04-10 DIAGNOSIS — R0789 Other chest pain: Secondary | ICD-10-CM

## 2019-04-10 LAB — CBC WITH DIFFERENTIAL/PLATELET
Basophils Absolute: 0.1 10*3/uL (ref 0.0–0.2)
Basos: 1 %
EOS (ABSOLUTE): 0.3 10*3/uL (ref 0.0–0.4)
Eos: 3 %
Hematocrit: 43.2 % (ref 34.0–46.6)
Hemoglobin: 14.9 g/dL (ref 11.1–15.9)
Immature Grans (Abs): 0 10*3/uL (ref 0.0–0.1)
Immature Granulocytes: 0 %
Lymphocytes Absolute: 3 10*3/uL (ref 0.7–3.1)
Lymphs: 37 %
MCH: 31 pg (ref 26.6–33.0)
MCHC: 34.5 g/dL (ref 31.5–35.7)
MCV: 90 fL (ref 79–97)
Monocytes Absolute: 0.7 10*3/uL (ref 0.1–0.9)
Monocytes: 9 %
Neutrophils Absolute: 4 10*3/uL (ref 1.4–7.0)
Neutrophils: 50 %
Platelets: 387 10*3/uL (ref 150–450)
RBC: 4.8 x10E6/uL (ref 3.77–5.28)
RDW: 11.8 % (ref 11.7–15.4)
WBC: 8 10*3/uL (ref 3.4–10.8)

## 2019-04-10 LAB — CMP14+EGFR
ALT: 16 IU/L (ref 0–32)
AST: 18 IU/L (ref 0–40)
Albumin/Globulin Ratio: 2.2 (ref 1.2–2.2)
Albumin: 4.3 g/dL (ref 3.6–4.6)
Alkaline Phosphatase: 100 IU/L (ref 39–117)
BUN/Creatinine Ratio: 14 (ref 12–28)
BUN: 11 mg/dL (ref 8–27)
Bilirubin Total: 0.4 mg/dL (ref 0.0–1.2)
CO2: 22 mmol/L (ref 20–29)
Calcium: 9.6 mg/dL (ref 8.7–10.3)
Chloride: 102 mmol/L (ref 96–106)
Creatinine, Ser: 0.81 mg/dL (ref 0.57–1.00)
GFR calc Af Amer: 77 mL/min/{1.73_m2} (ref >59)
GFR calc non Af Amer: 67 mL/min/{1.73_m2} (ref >59)
Globulin, Total: 2 g/dL (ref 1.5–4.5)
Glucose: 88 mg/dL (ref 65–99)
Potassium: 3.7 mmol/L (ref 3.5–5.2)
Sodium: 142 mmol/L (ref 134–144)
Total Protein: 6.3 g/dL (ref 6.0–8.5)

## 2019-04-10 LAB — SPECIMEN STATUS REPORT

## 2019-04-10 LAB — LIPID PANEL
Chol/HDL Ratio: 2.5 ratio (ref 0.0–4.4)
Cholesterol, Total: 167 mg/dL (ref 100–199)
HDL: 67 mg/dL (ref >39)
LDL Calculated: 58 mg/dL (ref 0–99)
Triglycerides: 212 mg/dL — ABNORMAL HIGH (ref 0–149)
VLDL Cholesterol Cal: 42 mg/dL — ABNORMAL HIGH (ref 5–40)

## 2019-04-10 NOTE — Progress Notes (Signed)
Referrals made

## 2019-04-10 NOTE — Telephone Encounter (Signed)
Daughter aware.

## 2019-04-10 NOTE — Telephone Encounter (Signed)
Referrals made

## 2019-04-11 ENCOUNTER — Telehealth: Payer: Self-pay | Admitting: Nurse Practitioner

## 2019-04-11 NOTE — Telephone Encounter (Signed)
Aware of results and copy sent

## 2019-04-23 ENCOUNTER — Ambulatory Visit: Payer: Medicare Other | Admitting: Medical

## 2019-04-24 DIAGNOSIS — R569 Unspecified convulsions: Secondary | ICD-10-CM | POA: Diagnosis not present

## 2019-05-01 ENCOUNTER — Ambulatory Visit (INDEPENDENT_AMBULATORY_CARE_PROVIDER_SITE_OTHER): Payer: Medicare Other | Admitting: *Deleted

## 2019-05-01 VITALS — BP 120/58

## 2019-05-01 DIAGNOSIS — Z0000 Encounter for general adult medical examination without abnormal findings: Secondary | ICD-10-CM | POA: Diagnosis not present

## 2019-05-01 NOTE — Progress Notes (Addendum)
MEDICARE ANNUAL WELLNESS VISIT  05/01/2019  Telephone Visit Disclaimer This Medicare AWV was conducted by telephone due to national recommendations for restrictions regarding the COVID-19 Pandemic (e.g. social distancing).  I verified, using two identifiers, that I am speaking with Christina Silva or their authorized healthcare agent. I discussed the limitations, risks, security, and privacy concerns of performing an evaluation and management service by telephone and the potential availability of an in-person appointment in the future. The patient expressed understanding and agreed to proceed.   Subjective:  Christina Silva is a 85 y.o. female patient of [name], [place] who had a Medicare Annual Wellness Visit today via telephone. Christina Silva is Retired and lives alone. she has 2 children. she reports that she is socially active and does interact with friends/family regularly. she is minimally physically active and enjoys cooking and playing [place].  Patient Care Team: [name], FNP as PCP - General (Nurse Practitioner)  Dr. Talmage Coin- Neuro Dr. Neita Garnet -Cardiology  Advanced Directives 05/01/2019 05/01/2019 03/19/2019 10/18/2018 10/18/2018 01/30/2017 05/24/2015  Does Patient Have a Medical Advance Directive? Yes Yes Yes No No No Yes  Type of Advance Directive Living will;Healthcare Power of [place];Living will - - - [place];Living will  Does patient want to make changes to medical advance directive? No - Patient declined - - - - - No - Patient declined  Copy of [place] in Chart? No - copy requested - No - copy requested - - - No - copy requested  Would patient like information on creating a medical advance directive? - - No - Patient declined No - Patient declined No - Patient declined - -    Hospital Utilization Over the Past 12 Months: # of hospitalizations or ER visits: 2 # of surgeries: 0   Review of Systems    Patient reports that her overall health is worse compared to last year.    Patient Reported Readings (BP, Pulse, CBG, Weight, etc) BP taken at home today was 120/58  Pain Assessment No pain today     Current Medications & Allergies (verified) Allergies as of 05/01/2019      Reactions   Codeine Nausea And Vomiting   Lisinopril Swelling   Morphine Nausea And Vomiting   Sulfa Antibiotics Nausea Only, Rash   Sulfonamide Derivatives Nausea Only, Rash      Medication List       Accurate as of May 01, 2019  2:11 PM. If you have any questions, ask your nurse or doctor.        STOP taking these medications   atorvastatin 40 MG tablet Commonly known as: LIPITOR   cetirizine 10 MG tablet Commonly known as: ZYRTEC   Fish Oil 1200 MG Caps   fluticasone 50 MCG/ACT nasal spray Commonly known as: FLONASE   pantoprazole 20 MG tablet Commonly known as: PROTONIX   predniSONE 10 MG (21) Tbpk tablet Commonly known as: STERAPRED UNI-PAK 21 TAB     TAKE these medications   alum & mag hydroxide-simeth [id number] MG/5ML suspension Commonly known as: Maalox Max Take 10 mLs by mouth every 6 (six) hours as needed for indigestion.   aspirin EC 81 MG tablet Take 81 mg by mouth daily. What changed: Another medication with the same name was removed. Continue taking this medication, and follow the directions you see here.   clopidogrel 75 MG tablet Commonly known as: PLAVIX Take 1 tablet (75 mg total) by  mouth daily.   diazepam 2 MG tablet Commonly known as: VALIUM Take 1 tablet (2 mg total) by mouth daily as needed. What changed: reasons to take this   esomeprazole 40 MG capsule Commonly known as: NEXIUM   losartan 100 MG tablet Commonly known as: COZAAR Take 1 tablet (100 mg total) by mouth daily. What changed: when to take this   SYSTANE OP Place 1 drop into both eyes 3 (three) times daily as needed (for dry eyes).   VITAMIN B 12 PO Take 1,000  mg by mouth 2 (two) times daily.   Vitamin D3 50 MCG (2000 UT) capsule Take 2,000 Units by mouth daily.       History (reviewed): Past Medical History:  Diagnosis Date  . Anxiety   . Cataract   . GERD (gastroesophageal reflux disease)   . Hyperlipidemia   . Hypertension   . Vertigo    Past Surgical History:  Procedure Laterality Date  . ABDOMINAL HYSTERECTOMY  1980  . CATARACT EXTRACTION Right   . EYE SURGERY    . KNEE ARTHROSCOPY Left   . ROTATOR CUFF REPAIR Left   . TONSILLECTOMY     Family History  Problem Relation Age of Onset  . Heart attack Mother   . Hyperlipidemia Mother   . Heart disease Mother   . Transient ischemic attack Father   . Stroke Father   . Psychiatric Illness Sister   . Heart disease Sister   . Arthritis Brother   . Epilepsy Brother    Social History   Socioeconomic History  . Marital status: Widowed    Spouse name: Not on file  . Number of children: 2  . Years of education: 10  . Highest education level: Not on file  Occupational History  . Not on file  Social Needs  . Financial resource strain: Not hard at all  . Food insecurity    Worry: Never true    Inability: Never true  . Transportation needs    Medical: No    Non-medical: No  Tobacco Use  . Smoking status: Former Smoker    Types: Cigarettes    Quit date: 01/09/1983    Years since quitting: 36.3  . Smokeless tobacco: Never Used  Substance and Sexual Activity  . Alcohol use: No  . Drug use: No  . Sexual activity: Yes  Lifestyle  . Physical activity    Days per week: 0 days    Minutes per session: 0 min  . Stress: Only a little  Relationships  . Social connections    Talks on phone: More than three times a week    Gets together: More than three times a week    Attends religious service: More than 4 times per year    Active member of club or organization: No    Attends meetings of clubs or organizations: Never    Relationship status: Widowed  Other Topics Concern   . Not on file  Social History Narrative  . Not on file    Activities of Daily Living In your present state of health, do you have any difficulty performing the following activities: 05/01/2019 10/18/2018  Hearing? N N  Vision? Y N  Difficulty concentrating or making decisions? N N  Walking or climbing stairs? N N  Dressing or bathing? N N  Doing errands, shopping? N N  Some recent data might be hidden    Patient Education/ Literacy How often do you need to have someone help  you when you read instructions, pamphlets, or other written materials from your doctor or pharmacy?: 1 - Never What is the last grade level you completed in school?: 10  Exercise    Diet Patient reports consuming 2 meals a day and 2 snack(s) a day Patient reports that her primary diet is: Low fat Patient reports that she does have regular access to food.   Depression Screen PHQ 2/9 Scores 05/01/2019 10/29/2018 09/26/2018 08/16/2018 03/05/2018 12/18/2017 11/27/2017  PHQ - 2 Score 0 0 0 0 0 0 0     Fall Risk Fall Risk  05/01/2019 10/29/2018 09/26/2018 08/16/2018 03/05/2018  Falls in the past year? 0 0 0 0 No     Objective:  Christina Silva seemed alert and oriented and she participated appropriately during our telephone visit.  Blood Pressure Weight BMI  BP Readings from Last 3 Encounters:  05/01/19 (!) 120/58  04/09/19 137/70  03/19/19 109/80   Wt Readings from Last 3 Encounters:  03/19/19 140 lb (63.5 kg)  10/29/18 143 lb (64.9 kg)  10/18/18 140 lb (63.5 kg)   BMI Readings from Last 1 Encounters:  03/19/19 23.30 kg/m    *Unable to obtain current vital signs, weight, and BMI due to telephone visit type  Hearing/Vision  . Christina Silva did not seem to have difficulty with hearing/understanding during the telephone conversation . Reports that she has not had a formal eye exam by an eye care professional within the past year . Reports that she has not had a formal hearing evaluation within the past year *Unable  to fully assess hearing and vision during telephone visit type  Cognitive Function: 6CIT Screen 05/01/2019  What Year? 0 points  What month? 0 points  What time? 0 points  Count back from 20 0 points  Months in reverse 0 points  Repeat phrase 0 points  Total Score 0   (Normal:0-7, Significant for Dysfunction: >8)  Normal Cognitive Function Screening: Yes   Immunization & Health Maintenance Record Immunization History  Administered Date(s) Administered  . Influenza, High Dose Seasonal PF 10/03/2016, 09/03/2017, 06/10/2018  . Influenza,inj,Quad PF,6+ Mos 05/24/2015  . Influenza-Unspecified 07/18/2018  . Pneumococcal Conjugate-13 05/24/2015  . Tdap 12/19/2010    Health Maintenance  Topic Date Due  . PNA vac Low Risk Adult (2 of 2 - PPSV23) 05/23/2016  . INFLUENZA VACCINE  03/29/2019  . TETANUS/TDAP  12/18/2020  . DEXA SCAN  Completed       Assessment  This is a routine wellness examination for Christina Silva.  Health Maintenance: Due or Overdue Health Maintenance Due  Topic Date Due  . PNA vac Low Risk Adult (2 of 2 - PPSV23) 05/23/2016  . INFLUENZA VACCINE  03/29/2019    Christina Silva does not need a referral for Community Assistance: Care Management:   no Social Work:    no Prescription Assistance:  no Nutrition/Diabetes Education:  no   Plan:  Personalized Goals Goals Addressed   None    Personalized Health Maintenance & Screening Recommendations  Pneumococcal vaccine  Influenza vaccine  Lung Cancer Screening Recommended: no (Low Dose CT Chest recommended if Age 67-80 years, 30 pack-year currently smoking OR have quit w/in past 15 years) Hepatitis C Screening recommended: no HIV Screening recommended: no  Advanced Directives: Written information was not prepared per patient's request.  Referrals & Orders No orders of the defined types were placed in this encounter.   Follow-up Plan . Follow-up with [name], FNP as planned .  [place] . Patient will have a pneumo 23 and flu vaccine on her visit 05/12/19 with Rockne Coons, FNp   I have personally reviewed and noted the following in the patient's chart:   . Medical and social history . Use of alcohol, tobacco or illicit drugs  . Current medications and supplements . Functional ability and status . Nutritional status . Physical activity . Advanced directives . List of other physicians . Hospitalizations, surgeries, and ER visits in previous 12 months . Vitals . Screenings to include cognitive, depression, and falls . Referrals and appointments  In addition, I have reviewed and discussed with Christina Silva certain preventive protocols, quality metrics, and best practice recommendations. A written personalized care plan for preventive services as well as general preventive health recommendations is available and can be mailed to the patient at her request.      Rana Snare, LPN  [id number]   I have reviewed and agree with the above AWV documentation.   Mary-Margaret Hassell Done, FNP

## 2019-05-06 NOTE — Progress Notes (Signed)
Cardiology Office Note   Date:  05/07/2019   ID:  MILLARD COLBAUGH, DOB 07/08/1933, MRN [id number]  PCP:  [name], FNP  Cardiologist:  Peter Martinique, MD EP: None  Chief Complaint  Patient presents with  . Follow-up      History of Present Illness: Christina Silva is a 85 y.o. female with a PMH of HTN, HLD, CVA, GERD, anxiety, and vertigo who presents for the evaluation of chest pain.  She was last evaluated by cardiology at an outpatient visit with Dr. Martinique 12/2017 to establish care for management of HTN and LE edema. She was recommended to undergo an echocardiogram which showed EF 55-60%, G1DD, mild AI, and mild-moderate MR/TR. In February 2020 she suffered a CVA and was recommended for aspirin and plavix x1 month, then aspirin alone daily. Repeat echo 09/2018 with EF 60-65%, G1DD, basal septal hypertrophy, mild AI, moderate aortic annular calcifications, and mild mitral annular calcifications. Unfortunately she had a TIA 02/2019 and was restarted on plavix.  She was seen in the ED 04/09/2019 with complaints of atypical chest pain with negative troponins and EKG. She was prescribed pantoprazole and recommended to follow-up with cardiology.  She presents today for ongoing evaluation of her chest pain. She denies any rhyme or reason to her chest pain but reports feeling chest tightness/pressure with associated SOB and lightheadedness which lasts for several minutes. She is fairly active with walking and household chores and denies any significant exertional complaints. She does report feeling fatigued over the past several weeks. No complaints of LE edema, orthopnea, PND, palpitations, or syncope. She states she would have more peace of mind if she had a stress test or a cardiac catheterization as her mother died from an MI at age 3.    Past Medical History:  Diagnosis Date  . Anxiety   . Cataract   . GERD (gastroesophageal reflux disease)   . Hyperlipidemia   .  Hypertension   . Vertigo     Past Surgical History:  Procedure Laterality Date  . ABDOMINAL HYSTERECTOMY  1980  . CATARACT EXTRACTION Right   . EYE SURGERY    . KNEE ARTHROSCOPY Left   . ROTATOR CUFF REPAIR Left   . TONSILLECTOMY       Current Outpatient Medications  Medication Sig Dispense Refill  . alum & mag hydroxide-simeth (MAALOX MAX) 400-400-40 MG/5ML suspension Take 10 mLs by mouth every 6 (six) hours as needed for indigestion. 355 mL 0  . aspirin EC 81 MG tablet Take 81 mg by mouth daily.    . Cholecalciferol (VITAMIN D3) 50 MCG (2000 UT) capsule Take 2,000 Units by mouth daily.    . clopidogrel (PLAVIX) 75 MG tablet Take 1 tablet (75 mg total) by mouth daily. 30 tablet 1  . Cyanocobalamin (VITAMIN B 12 PO) Take 1,000 mg by mouth 2 (two) times daily.     . diazepam (VALIUM) 2 MG tablet Take 1 tablet (2 mg total) by mouth daily as needed. (Patient taking differently: Take 2 mg by mouth daily as needed for anxiety. ) 30 tablet 1  . esomeprazole (NEXIUM) 40 MG capsule     . losartan (COZAAR) 100 MG tablet Take 1 tablet (100 mg total) by mouth daily. (Patient taking differently: Take 100 mg by mouth at bedtime. ) 90 tablet 1  . Polyethyl Glycol-Propyl Glycol (SYSTANE OP) Place 1 drop into both eyes 3 (three) times daily as needed (for dry eyes).     Marland Kitchen amLODipine (  NORVASC) 2.5 MG tablet Take 1 tablet (2.5 mg total) by mouth daily. 30 tablet 6   No current facility-administered medications for this visit.     Allergies:   Codeine, Lisinopril, Morphine, Sulfa antibiotics, and Sulfonamide derivatives    Social History:  The patient  reports that she quit smoking about 36 years ago. Her smoking use included cigarettes. She has never used smokeless tobacco. She reports that she does not drink alcohol or use drugs.   Family History:  The patient's family history includes Arthritis in her brother; Epilepsy in her brother; Heart attack in her mother; Heart disease in her mother and  sister; Hyperlipidemia in her mother; Psychiatric Illness in her sister; Stroke in her father; Transient ischemic attack in her father.    ROS:  Please see the history of present illness.   Otherwise, review of systems are positive for none.   All other systems are reviewed and negative.    PHYSICAL EXAM: VS:  BP (!) 148/77 (BP Location: Left Arm, Patient Position: Sitting, Cuff Size: Normal)   Pulse 78   Ht 5\' 4"  (1.626 m)   Wt 144 lb 3.2 oz (65.4 kg)   BMI 24.75 kg/m  , BMI Body mass index is 24.75 kg/m. GEN: Well nourished, well developed, in no acute distress HEENT: sclera anicteric Neck: no JVD, carotid bruits, or masses Cardiac: RRR; no murmurs, rubs, or gallops, no edema  Respiratory:  clear to auscultation bilaterally, normal work of breathing GI: soft, nontender, nondistended, + BS MS: no deformity or atrophy Skin: warm and dry, no rash Neuro:  Strength and sensation are intact Psych: euthymic mood, full affect   EKG:  EKG is not ordered today.  Recent Labs: 10/18/2018: Magnesium 1.9 04/09/2019: ALT 21; BUN 12; Creatinine, Ser 0.85; Hemoglobin 14.6; Platelets 363; Potassium 3.2; Sodium 141    Lipid Panel    Component Value Date/Time   CHOL 167 04/09/2019 0847   TRIG 212 (H) 04/09/2019 0847   TRIG 149 02/26/2014 0930   HDL 67 04/09/2019 0847   HDL 68 02/26/2014 0930   CHOLHDL 2.5 04/09/2019 0847   CHOLHDL 3.6 10/18/2018 0625   VLDL 27 10/18/2018 0625   LDLCALC 58 04/09/2019 0847   LDLCALC 144 (H) 02/26/2014 0930      Wt Readings from Last 3 Encounters:  05/07/19 144 lb 3.2 oz (65.4 kg)  03/19/19 140 lb (63.5 kg)  10/29/18 143 lb (64.9 kg)      Other studies Reviewed: Additional studies/ records that were reviewed today include:   Echocardiogram 09/2018: IMPRESSIONS    1. The left ventricle has normal systolic function with an ejection fraction of 60-65%. The cavity size was normal. There is basal septal hypertophy. Left ventricular diastolic  Doppler parameters are consistent with impaired relaxation.  2. The right ventricle has normal systolic function. The cavity was normal. There is no increase in right ventricular wall thickness.  3. The aortic valve is tricuspid Moderate thickening of the aortic valve Moderate calcification of the aortic valve. Aortic valve regurgitation is mild by color flow Doppler.. Moderate aortic annular calcification noted.  4. The mitral valve is normal in structure. Mild thickening of the mitral valve leaflet. Mild calcification of the mitral valve leaflet. There is mild mitral annular calcification present. No evidence of mitral valve stenosis.  5. The tricuspid valve is normal in structure.  6. The pulmonic valve was grossly normal. Pulmonic valve regurgitation is mild by color flow Doppler.  7. The aortic root is  normal in size and structure.  8. Pulmonary hypertension is indeterminat,inadequate TR jet.     ASSESSMENT AND PLAN:  1. Chest pain: patient reports several episodes of chest pain which are non-exertional but associated with SOB and lightheadedness. She has been to the ED twice for these complaints and prescribed antacid medications for presumed GI etiology. Risk factors for CAD include HTN, HLD, and family history of CAD. Patient requested additional testing for peace of mind.  - Will plan for a NST to further evaluate her atypical chest pain.   2. HTN: BP 148/77 today. She reports SBP is frequently above 140 at home - Will start amlodipine 2.5mg  daily. Patient will keep a log of her blood pressures and notify the office if SBP is persistently >130, as her amlodipine may need to be increased.   - Continue losartan  3. HLD: LDL 58 on lipids 04/09/2019. She reports muscle cramping with atorvastatin which she recently discontinued and is uninterested in an alternative statin. We also discussed possible starting zetia which she declined at this time - Continue to [place] monitor routinely.   4.  CVA/TIA: diagnosed with a CVA 09/2018 with TIA 02/2019. Aspirin decreased to 81mg  daily from 325mg  daily at recent ED visit for possible gastritis component to chest pain.  - Continue aspirin 81mg  daily and plavix 75mg  daily   Current medicines are reviewed at length with the patient today.  The patient does not have concerns regarding medicines.  The following changes have been made:  Start amlodipine 2.5mg  daily  Labs/ tests ordered today include:   Orders Placed This Encounter  Procedures  . MYOCARDIAL PERFUSION IMAGING     Disposition:   FU with Dr. Martinique in 6 months  Signed, Abigail Butts, PA-C  05/07/2019 12:16 PM

## 2019-05-07 ENCOUNTER — Other Ambulatory Visit: Payer: Self-pay

## 2019-05-07 ENCOUNTER — Encounter: Payer: Self-pay | Admitting: Medical

## 2019-05-07 ENCOUNTER — Encounter: Payer: Self-pay | Admitting: Cardiology

## 2019-05-07 ENCOUNTER — Ambulatory Visit (INDEPENDENT_AMBULATORY_CARE_PROVIDER_SITE_OTHER): Payer: Medicare Other | Admitting: Medical

## 2019-05-07 VITALS — BP 148/77 | HR 78 | Ht 64.0 in | Wt 144.2 lb

## 2019-05-07 DIAGNOSIS — R079 Chest pain, unspecified: Secondary | ICD-10-CM

## 2019-05-07 DIAGNOSIS — I10 Essential (primary) hypertension: Secondary | ICD-10-CM | POA: Diagnosis not present

## 2019-05-07 DIAGNOSIS — E785 Hyperlipidemia, unspecified: Secondary | ICD-10-CM

## 2019-05-07 MED ORDER — AMLODIPINE BESYLATE 2.5 MG PO TABS: 2.5000 mg | ORAL_TABLET | Freq: Every day | ORAL | 6 refills | Status: DC

## 2019-05-07 NOTE — Patient Instructions (Signed)
Medication Instructions:  START AMLODIPINE 2.5MG  DAILY If you need a refill on your cardiac medications before your next appointment, please call your pharmacy.  Testing/Procedures: Your physician has requested that you have a lexiscan myoview. A cardiac stress test is a cardiological test that measures the heart's ability to respond to external stress in a controlled clinical environment. The stress response is induced by intravenous pharmacological stimulation.   Special Instructions: TAKE AND LOG YOU BLOOD PRESSURE FOR 2 WEEKS  Follow-Up: You will need a follow up appointment in 6 months.  Please call our office 2 months in advance to schedule this appointment.  You may see Peter Martinique, MD  Roby Lofts PA-C or one of the following Advanced Practice Providers on your designated Care Team: [place], [place] . Fabian Sharp, PA-C     At [facility], you and your health needs are our priority.  As part of our continuing mission to provide you with exceptional heart care, we have created designated Provider Care Teams.  These Care Teams include your primary Cardiologist (physician) and Advanced Practice Providers (APPs -  Physician Assistants and Nurse Practitioners) who all work together to provide you with the care you need, when you need it.  Thank you for choosing [facility]!!

## 2019-05-09 ENCOUNTER — Telehealth (HOSPITAL_COMMUNITY): Payer: Self-pay | Admitting: *Deleted

## 2019-05-09 NOTE — Telephone Encounter (Signed)
Close encounter 

## 2019-05-12 ENCOUNTER — Encounter: Payer: Self-pay | Admitting: Nurse Practitioner

## 2019-05-12 ENCOUNTER — Ambulatory Visit (INDEPENDENT_AMBULATORY_CARE_PROVIDER_SITE_OTHER): Payer: Medicare Other | Admitting: Nurse Practitioner

## 2019-05-12 DIAGNOSIS — E785 Hyperlipidemia, unspecified: Secondary | ICD-10-CM | POA: Diagnosis not present

## 2019-05-12 DIAGNOSIS — I6930 Unspecified sequelae of cerebral infarction: Secondary | ICD-10-CM

## 2019-05-12 DIAGNOSIS — K219 Gastro-esophageal reflux disease without esophagitis: Secondary | ICD-10-CM

## 2019-05-12 DIAGNOSIS — F411 Generalized anxiety disorder: Secondary | ICD-10-CM

## 2019-05-12 DIAGNOSIS — R739 Hyperglycemia, unspecified: Secondary | ICD-10-CM | POA: Diagnosis not present

## 2019-05-12 DIAGNOSIS — I10 Essential (primary) hypertension: Secondary | ICD-10-CM

## 2019-05-12 DIAGNOSIS — I639 Cerebral infarction, unspecified: Secondary | ICD-10-CM

## 2019-05-12 DIAGNOSIS — M8588 Other specified disorders of bone density and structure, other site: Secondary | ICD-10-CM

## 2019-05-12 MED ORDER — DIAZEPAM 2 MG PO TABS: 2.0000 mg | ORAL_TABLET | Freq: Every day | ORAL | 2 refills | Status: DC | PRN

## 2019-05-12 NOTE — Progress Notes (Signed)
Virtual Visit via telephone Note Due to COVID-19 pandemic this visit was conducted virtually. This visit type was conducted due to national recommendations for restrictions regarding the COVID-19 Pandemic (e.g. social distancing, sheltering in place) in an effort to limit this patient's exposure and mitigate transmission in our community. All issues noted in this document were discussed and addressed.  A physical exam was not performed with this format.  I connected with Christina Silva on 05/12/19 at 1:35 by telephone and verified that I am speaking with the correct person using two identifiers. Christina Silva is currently located at home and no one is currently with  her during visit. The provider, Mary-Margaret Hassell Done, FNP is located in their office at time of visit.  I discussed the limitations, risks, security and privacy concerns of performing an evaluation and management service by telephone and the availability of in person appointments. I also discussed with the patient that there may be a patient responsible charge related to this service. The patient expressed understanding and agreed to proceed.   History and Present Illness:   Chief Complaint: Medical Management of Chronic Issues    HPI:  1. Essential hypertension No c/o chest pain, sob or headache. She is scheduled for a stress test on Wednesday of this week. BP Readings from Last 3 Encounters:  05/07/19 (!) 148/77  05/01/19 (!) 120/58  04/09/19 137/70    2. Hyperlipidemia with target LDL less than 100 Has had poor appetite since her husband died last year. She refuses to take lipitor because of bones hurting. Lab Results  Component Value Date   CHOL 167 04/09/2019   HDL 67 04/09/2019   LDLCALC 58 04/09/2019   TRIG 212 (H) 04/09/2019   CHOLHDL 2.5 04/09/2019     3. Gastroesophageal reflux disease without esophagitis Indigestion and heart burn has been so bad that she has had chest pain that she thought was  her heart. The ER told her it was not her heart. They are sending her to GI for evaluation.   4. Hyperglycemia She does not eat added of sweets  5. Anxiety state Stays nervous  6. Osteopenia of spine Has aged out of having anymore dexascans.  7. Acute CVA (cerebrovascular accident) ([place]) Seeing DR Feliz Beam. He did a Head CT 3 weeks ago and was normal. She has no residual effects other then feeling tired. She is still  On plavix and 81mg  aspirin.  8. GAD She was put on this when she was in the hospital. They started her on valium 5mg  bid. Has really helped calm her down.  Outpatient Encounter Medications as of 05/12/2019  Medication Sig  . alum & mag hydroxide-simeth (MAALOX MAX) 400-400-40 MG/5ML suspension Take 10 mLs by mouth every 6 (six) hours as needed for indigestion.  Marland Kitchen amLODipine (NORVASC) 2.5 MG tablet Take 1 tablet (2.5 mg total) by mouth daily.  Marland Kitchen aspirin EC 81 MG tablet Take 81 mg by mouth daily.  . Cholecalciferol (VITAMIN D3) 50 MCG (2000 UT) capsule Take 2,000 Units by mouth daily.  . clopidogrel (PLAVIX) 75 MG tablet Take 1 tablet (75 mg total) by mouth daily.  . Cyanocobalamin (VITAMIN B 12 PO) Take 1,000 mg by mouth 2 (two) times daily.   . diazepam (VALIUM) 2 MG tablet Take 1 tablet (2 mg total) by mouth daily as needed. (Patient taking differently: Take 2 mg by mouth daily as needed for anxiety. )  . esomeprazole (NEXIUM) 40 MG capsule   . losartan (COZAAR)  100 MG tablet Take 1 tablet (100 mg total) by mouth daily. (Patient taking differently: Take 100 mg by mouth at bedtime. )  . Polyethyl Glycol-Propyl Glycol (SYSTANE OP) Place 1 drop into both eyes 3 (three) times daily as needed (for dry eyes).     Past Surgical History:  Procedure Laterality Date  . ABDOMINAL HYSTERECTOMY  1980  . CATARACT EXTRACTION Right   . EYE SURGERY    . KNEE ARTHROSCOPY Left   . ROTATOR CUFF REPAIR Left   . TONSILLECTOMY      Family History  Problem Relation Age of Onset  .  Heart attack Mother   . Hyperlipidemia Mother   . Heart disease Mother   . Transient ischemic attack Father   . Stroke Father   . Psychiatric Illness Sister   . Heart disease Sister   . Arthritis Brother   . Epilepsy Brother     New complaints: Only complaint is fatigue  Social history: lives by herself since her husband died  Controlled substance contract:      Review of Systems  Constitutional: Negative for diaphoresis and weight loss.  Eyes: Negative for blurred vision, double vision and pain.  Respiratory: Negative for shortness of breath.   Cardiovascular: Negative for chest pain, palpitations, orthopnea and leg swelling.  Gastrointestinal: Negative for abdominal pain.  Skin: Negative for rash.  Neurological: Positive for weakness. Negative for dizziness, sensory change, loss of consciousness and headaches.  Endo/Heme/Allergies: Negative for polydipsia. Does not bruise/bleed easily.  Psychiatric/Behavioral: Negative for memory loss. The patient does not have insomnia.   All other systems reviewed and are negative.    Observations/Objective: Alert and oriented- answers all questions appropriately No distress  B/P 136/51 HR 80- patient reported  Assessment and Plan: Christina Silva comes in today with chief complaint of Medical Management of Chronic Issues   Diagnosis and orders addressed:  1. Essential hypertension Low sodium diet  2. Hyperlipidemia with target LDL less than 100 Low fat diet  3. Gastroesophageal reflux disease without esophagitis Avoid spicy foods Do not eat 2 hours prior to bedtime  4. Hyperglycemia Watch sugars in diet  5. Anxiety state Stress management- continue valium as rx BID  6. Osteopenia of spine Weight bearing exercises  7. Acute CVA (cerebrovascular accident) [facility]) Keep follow up with dr. Theda Belfast   Previous labs reviewed Health Maintenance reviewed Diet and exercise encouraged  Follow up plan: 3 months      I discussed the assessment and treatment plan with the patient. The patient was provided an opportunity to ask questions and all were answered. The patient agreed with the plan and demonstrated an understanding of the instructions.   The patient was advised to call back or seek an in-person evaluation if the symptoms worsen or if the condition fails to improve as anticipated.  The above assessment and management plan was discussed with the patient. The patient verbalized understanding of and has agreed to the management plan. Patient is aware to call the clinic if symptoms persist or worsen. Patient is aware when to return to the clinic for a follow-up visit. Patient educated on when it is appropriate to go to the emergency department.   Time call ended:  1:53  I provided 13 minutes of non-face-to-face time during this encounter.    Mary-Margaret Hassell Done, FNP

## 2019-05-13 ENCOUNTER — Other Ambulatory Visit: Payer: Self-pay

## 2019-05-13 ENCOUNTER — Ambulatory Visit (INDEPENDENT_AMBULATORY_CARE_PROVIDER_SITE_OTHER): Payer: Medicare Other | Admitting: Internal Medicine

## 2019-05-13 ENCOUNTER — Encounter (HOSPITAL_COMMUNITY): Payer: Medicare Other

## 2019-05-13 ENCOUNTER — Encounter: Payer: Self-pay | Admitting: Internal Medicine

## 2019-05-13 VITALS — BP 120/68 | HR 60 | Temp 97.8°F | Ht 64.0 in | Wt 145.0 lb

## 2019-05-13 DIAGNOSIS — R1310 Dysphagia, unspecified: Secondary | ICD-10-CM

## 2019-05-13 DIAGNOSIS — R932 Abnormal findings on diagnostic imaging of liver and biliary tract: Secondary | ICD-10-CM

## 2019-05-13 DIAGNOSIS — R0789 Other chest pain: Secondary | ICD-10-CM

## 2019-05-13 DIAGNOSIS — R12 Heartburn: Secondary | ICD-10-CM

## 2019-05-13 DIAGNOSIS — K8020 Calculus of gallbladder without cholecystitis without obstruction: Secondary | ICD-10-CM | POA: Diagnosis not present

## 2019-05-13 MED ORDER — PANTOPRAZOLE SODIUM 40 MG PO TBEC: 40.0000 mg | DELAYED_RELEASE_TABLET | Freq: Every day | ORAL | 3 refills | Status: DC

## 2019-05-13 NOTE — Progress Notes (Signed)
Christina Silva 85 y.o. 1933-07-30 [id number]  Assessment & Plan:   Encounter Diagnoses  Name Primary?  . Atypical chest pain Yes  . Heartburn   . Pill dysphagia   . Gallstones   . Abnormal liver ultrasound - nodular and echodense    Cause of these problems not known.  I think we need to see what her stress test tells [place].  If that is unrevealing pursue additional GI work-up consider barium swallow versus EGD, I am inclined to start with a barium swallow and tablet.  She does not have biochemical parameters suggestive of cirrhosis so I am not sure where the nodular liver is coming from though if she had tricuspid regurgitation problems perhaps there could be some backflow and congestion.  Consider further evaluation of the liver pending the above.  She would like to use pantoprazole because it is $8 for a prescription as opposed to the 50 for the  esomeprazole.  We will change to pantoprazole.  Finish the esomeprazole first  I appreciate the opportunity to care for this patient. CC: [name], FNP Dr. Phillips Odor Subjective:   Chief Complaint: Chest pain/pressure  HPI Patient is an 85 year old white woman, a widow for little more than 2 years, here with her daughter because of intermittent episodes of chest pressure and tightness.  She has been to the emergency room twice for this.  The last time was on August 12 where troponins were negative and EKG negative.  Additional imaging as below.  Ultrasound April 09, 2019 with liver showing nodular contour, cholelithiasis and mild increase in liver echogenicity no biliary ductal dilation Chest x-ray same day levocurvature of thoracic spine similar to prior exams degenerative changes in the bony joints, no active cardiopulmonary disease  She describes on that day being at a department store while her daughter was getting her car serviced and she was there and developed a substernal chest tightness that worsened into a  more severe pressure.  She was perhaps slightly short of breath no sweating no nausea and it was not really exertional.  It is a squeezing sensation.  She sat down and rested and it went away but she went to the ER.  There was one other bad episode where she may have gone to an ER though I do not see that in the records.  She saw cardiology recently and she is set up for a stress test.  She had a small stroke in February and was admitted to [facility].  She also had a TIA in July associated with dizziness.  Echocardiogram in February demonstrated EF 60 to 123456, diastolic dysfunction type findings, aortic calcification, mild calcification of the mitral valve, mild pulmonic valve regurgitation and could not really characterize for pulmonary hypertension.  2019 she did have some mild pulmonary artery pressure elevation and also had tricuspid regurgitation mild to moderate along with mitral regurgitation.  She has never been a drinker.  She was under considerable stress caring for her husband and then there is been stress and anxiety after his death.  She and her daughter think that may have some role. Allergies  Allergen Reactions  . Codeine Nausea And Vomiting  . Lisinopril Swelling  . Morphine Nausea And Vomiting  . Sulfa Antibiotics Nausea Only and Rash  . Sulfonamide Derivatives Nausea Only and Rash   Current Meds  Medication Sig  . alum & mag hydroxide-simeth (MAALOX MAX) 400-400-40 MG/5ML suspension Take 10 mLs by mouth every 6 (six) hours  as needed for indigestion.  Marland Kitchen amLODipine (NORVASC) 2.5 MG tablet Take 1 tablet (2.5 mg total) by mouth daily.  Marland Kitchen aspirin EC 81 MG tablet Take 81 mg by mouth daily.  . Cholecalciferol (VITAMIN D3) 50 MCG (2000 UT) capsule Take 2,000 Units by mouth daily.  . clopidogrel (PLAVIX) 75 MG tablet Take 1 tablet (75 mg total) by mouth daily.  . Cyanocobalamin (VITAMIN B 12 PO) Take 1,000 mg by mouth 2 (two) times daily.   . diazepam (VALIUM) 2 MG tablet Take 1 tablet  (2 mg total) by mouth daily as needed.  Marland Kitchen losartan (COZAAR) 100 MG tablet Take 1 tablet (100 mg total) by mouth daily. (Patient taking differently: Take 100 mg by mouth at bedtime. )  . Polyethyl Glycol-Propyl Glycol (SYSTANE OP) Place 1 drop into both eyes 3 (three) times daily as needed (for dry eyes).   . [DISCONTINUED] esomeprazole (NEXIUM) 40 MG capsule    Past Medical History:  Diagnosis Date  . Anxiety   . Cataract   . GERD (gastroesophageal reflux disease)   . Hyperlipidemia   . Hypertension   . Vertigo    Past Surgical History:  Procedure Laterality Date  . ABDOMINAL HYSTERECTOMY  1980  . CATARACT EXTRACTION Right   . EYE SURGERY    . KNEE ARTHROSCOPY Left   . ROTATOR CUFF REPAIR Left   . TONSILLECTOMY     Social History   Social History Narrative   Widowed in 2018   Retired   A daughter lives nearby   Former smoker no alcohol tobacco or drug use now   family history includes Arthritis in her brother; Epilepsy in her brother; Heart attack in her mother; Heart disease in her mother and sister; Hyperlipidemia in her mother; Psychiatric Illness in her sister; Stroke in her father; Transient ischemic attack in her father.   Review of Systems  As per HPI all other review of systems negative Objective:   Physical Exam @BP  120/68   Pulse 60   Temp 97.8 F (36.6 C) (Oral)   Ht 5\' 4"  (1.626 m)   Wt 145 lb (65.8 kg)   BMI 24.89 kg/m @  General:  Well-developed, well-nourished and in no acute distress spry Eyes:  anicteric. ENT:   Mouth and posterior pharynx free of lesions.  Neck:   supple w/o thyromegaly or mass.  Lungs: Clear to auscultation bilaterally. Heart:  S1S2, no rubs, murmurs, gallops. Abdomen:  soft, non-tender, no hepatosplenomegaly, hernia, or mass and BS+.  Lymph:  no cervical or supraclavicular adenopathy. Extremities:   no edema, cyanosis or clubbing Skin   no rash. Neuro:  A&O x 3.  Psych:  appropriate mood and  Affect.   Data Reviewed:  As per HPI

## 2019-05-13 NOTE — Patient Instructions (Signed)
Good luck with your stress test tomorrow.   Dr Carlean Purl will be back in touch after he see's those results. If you haven't heard from [place] by next week give [place] a call back. [phone].   I appreciate the opportunity to care for you. Silvano Rusk, MD, [facility]

## 2019-05-14 ENCOUNTER — Ambulatory Visit (HOSPITAL_COMMUNITY)
Admission: RE | Admit: 2019-05-14 | Discharge: 2019-05-14 | Disposition: A | Discharge status: Home / Self Care | Payer: Medicare Other | Source: Ambulatory Visit | Attending: Cardiovascular Disease | Admitting: Cardiovascular Disease

## 2019-05-14 DIAGNOSIS — R079 Chest pain, unspecified: Secondary | ICD-10-CM | POA: Diagnosis not present

## 2019-05-14 MED ORDER — TECHNETIUM TC 99M TETROFOSMIN IV KIT
31.2000 | PACK | Freq: Once | INTRAVENOUS | Status: AC | PRN
  Administered 2019-05-14: 31.2 via INTRAVENOUS
  Filled 2019-05-14: qty 32

## 2019-05-14 MED ORDER — REGADENOSON 0.4 MG/5ML IV SOLN
0.4000 mg | Freq: Once | INTRAVENOUS | Status: AC
  Administered 2019-05-14: 0.4 mg via INTRAVENOUS

## 2019-05-14 MED ORDER — TECHNETIUM TC 99M TETROFOSMIN IV KIT
10.5000 | PACK | Freq: Once | INTRAVENOUS | Status: AC | PRN
  Administered 2019-05-14: 10.5 via INTRAVENOUS
  Filled 2019-05-14: qty 11

## 2019-05-15 LAB — MYOCARDIAL PERFUSION IMAGING
LV dias vol: 67 mL (ref 46–106)
LV sys vol: 23 mL
Peak HR: 114 {beats}/min
Rest HR: 73 {beats}/min
SDS: 0
SRS: 0
SSS: 0
TID: 1.01

## 2019-05-20 ENCOUNTER — Telehealth: Payer: Self-pay | Admitting: Cardiology

## 2019-05-20 NOTE — Telephone Encounter (Signed)
New message   Patient's daughter is calling to get the results of myocardial perfusion. Please call.

## 2019-05-20 NOTE — Telephone Encounter (Signed)
Called daughter per DPR- advised of results of stress test. Will make primary aware. Thanks!

## 2019-05-21 ENCOUNTER — Telehealth: Payer: Self-pay | Admitting: Internal Medicine

## 2019-05-21 NOTE — Telephone Encounter (Signed)
Stress test negative  Please schedule Ba swallow w/ tablet  Dx chest pain and pill dysphagia   Thank them for reminding me

## 2019-05-21 NOTE — Telephone Encounter (Signed)
Dr Carlean Purl have you reviewed the stress test results?  The pt is calling for next steps.

## 2019-05-21 NOTE — Telephone Encounter (Signed)
OK 

## 2019-05-21 NOTE — Telephone Encounter (Signed)
Dr Carlean Purl I spoke with the pt and she tells me that she wants to wait and see how she does.  Since her office visit she has not had any dysphagia.  She was advised to call if symptoms return

## 2019-05-27 ENCOUNTER — Other Ambulatory Visit: Payer: Self-pay

## 2019-05-29 ENCOUNTER — Other Ambulatory Visit: Payer: Self-pay

## 2019-05-29 ENCOUNTER — Ambulatory Visit (INDEPENDENT_AMBULATORY_CARE_PROVIDER_SITE_OTHER): Payer: Medicare Other | Admitting: *Deleted

## 2019-05-29 ENCOUNTER — Other Ambulatory Visit: Payer: Self-pay | Admitting: Nurse Practitioner

## 2019-05-29 DIAGNOSIS — Z23 Encounter for immunization: Secondary | ICD-10-CM

## 2019-05-29 DIAGNOSIS — I10 Essential (primary) hypertension: Secondary | ICD-10-CM

## 2019-06-26 ENCOUNTER — Other Ambulatory Visit: Payer: Self-pay

## 2019-06-30 ENCOUNTER — Ambulatory Visit (INDEPENDENT_AMBULATORY_CARE_PROVIDER_SITE_OTHER): Payer: Medicare Other | Admitting: *Deleted

## 2019-06-30 ENCOUNTER — Other Ambulatory Visit: Payer: Self-pay

## 2019-06-30 DIAGNOSIS — Z23 Encounter for immunization: Secondary | ICD-10-CM

## 2019-07-01 ENCOUNTER — Ambulatory Visit: Payer: Medicare Other | Admitting: Cardiology

## 2019-07-11 ENCOUNTER — Other Ambulatory Visit: Payer: Self-pay

## 2019-07-11 NOTE — Patient Outreach (Signed)
[facility]) Care Management  07/11/2019  Christina Silva 07/31/1933 [id number]   Medication Adherence call to Christina Silva [facility] Identifiers Verify spoke with patient she is past due on Atorvastatin 40 mg,patient explain she has stop taking this medication 3 month ago,patients choice,patient was having side effects,patient has an appointment on [phone] patient will go over with her doctor.Christina Silva is showing past due under [place].   [place] Management Direct Dial [phone]  Fax [phone] [email or web address]

## 2019-07-15 ENCOUNTER — Encounter: Payer: Self-pay | Admitting: Internal Medicine

## 2019-07-15 ENCOUNTER — Ambulatory Visit: Payer: Medicare Other | Admitting: Internal Medicine

## 2019-07-15 ENCOUNTER — Other Ambulatory Visit: Payer: Self-pay

## 2019-07-15 VITALS — BP 162/82 | HR 75 | Temp 97.6°F | Ht 64.0 in | Wt 143.0 lb

## 2019-07-15 DIAGNOSIS — R932 Abnormal findings on diagnostic imaging of liver and biliary tract: Secondary | ICD-10-CM | POA: Diagnosis not present

## 2019-07-15 DIAGNOSIS — R1013 Epigastric pain: Secondary | ICD-10-CM

## 2019-07-15 DIAGNOSIS — K8020 Calculus of gallbladder without cholecystitis without obstruction: Secondary | ICD-10-CM | POA: Diagnosis not present

## 2019-07-15 DIAGNOSIS — Z1159 Encounter for screening for other viral diseases: Secondary | ICD-10-CM

## 2019-07-15 NOTE — Progress Notes (Signed)
Christina Silva 85 y.o. 02-22-1933 [id number]  Assessment & Plan:   Encounter Diagnoses  Name Primary?  . Abdominal pain, epigastric Yes  . Gallstones   . Abnormal liver ultrasound   . Special screening examination for viral disease     I think it is reasonable to pursue an EGD.  Given her age and the symptoms I think it best to make sure there is no mucosal abnormality or an ulcer that is malignant that was partially treated.  I am continuing her Plavix as I think diagnostic EGD is safe on Plavix.  The risks and benefits as well as alternatives of endoscopic procedure(s) have been discussed and reviewed. All questions answered. The patient agrees to proceed.   With respect to the gallstones I am not sure if she is symptomatic though it is possible.  Once we complete the EGD we will discuss possible surgical referral.  She is not interested in pursuing any type of surgery until 2021 which based upon the symptoms I have heard is reasonable.  She did end up in the ER with fairly intense lower chest epigastric pain on 2 occasions so that could have been symptomatic cholelithiasis though she is improved on PPI therapy as well so it is not entirely clear.  The radiologist also thought that she might have a cirrhotic configuration or findings "suggestive of cirrhosis" on her ultrasound.  Biochemically and clinically there are no signs. I appreciate the opportunity to care for this patient. CC: [name], FNP   Subjective:   Chief Complaint: Chest pain and epigastric pain  HPI Here for follow-up after seen for chest pain in September, daughter accompanies her.  Stress testing was negative.  She is on pantoprazole and is much better at the 40 mg daily dose.  She has questions about gallstones.  She knows she had those on the ultrasound test she had done.  We reviewed that and why they occur and what the symptoms are.  She does have some postprandial epigastric pain but  nothing radiating into the back and nothing intense.  No dysphagia.  Wt Readings from Last 3 Encounters:  07/15/19 143 lb (64.9 kg)  05/14/19 144 lb (65.3 kg)  05/13/19 145 lb (65.8 kg)    Allergies  Allergen Reactions  . Codeine Nausea And Vomiting  . Lisinopril Swelling  . Morphine Nausea And Vomiting  . Sulfa Antibiotics Nausea Only and Rash  . Sulfonamide Derivatives Nausea Only and Rash   Current Meds  Medication Sig  . alum & mag hydroxide-simeth (MAALOX MAX) 400-400-40 MG/5ML suspension Take 10 mLs by mouth every 6 (six) hours as needed for indigestion.  Marland Kitchen amLODipine (NORVASC) 2.5 MG tablet Take 1 tablet (2.5 mg total) by mouth daily.  Marland Kitchen aspirin EC 81 MG tablet Take 81 mg by mouth daily.  . Cholecalciferol (VITAMIN D3) 50 MCG (2000 UT) capsule Take 2,000 Units by mouth daily.  . clopidogrel (PLAVIX) 75 MG tablet Take 1 tablet (75 mg total) by mouth daily.  . diazepam (VALIUM) 2 MG tablet Take 1 tablet (2 mg total) by mouth daily as needed.  Marland Kitchen losartan (COZAAR) 100 MG tablet TAKE ONE (1) TABLET EACH DAY  . pantoprazole (PROTONIX) 40 MG tablet Take 1 tablet (40 mg total) by mouth daily before breakfast.  . Polyethyl Glycol-Propyl Glycol (SYSTANE OP) Place 1 drop into both eyes 3 (three) times daily as needed (for dry eyes).    Past Medical History:  Diagnosis Date  . Anxiety   .  Cataract   . GERD (gastroesophageal reflux disease)   . Hyperlipidemia   . Hypertension   . Vertigo    Past Surgical History:  Procedure Laterality Date  . ABDOMINAL HYSTERECTOMY  1980  . CATARACT EXTRACTION Right   . EYE SURGERY    . KNEE ARTHROSCOPY Left   . ROTATOR CUFF REPAIR Left   . TONSILLECTOMY     Social History   Social History Narrative   Widowed in 2018   Retired   A daughter lives nearby   Former smoker no alcohol tobacco or drug use now   family history includes Arthritis in her brother; Epilepsy in her brother; Heart attack in her mother; Heart disease in her mother  and sister; Hyperlipidemia in her mother; Psychiatric Illness in her sister; Stroke in her father; Transient ischemic attack in her father.   Review of Systems As per HPI  Objective:   Physical Exam BP (!) 162/82   Pulse 75   Temp 97.6 F (36.4 C) (Temporal)   Ht 5\' 4"  (1.626 m)   Wt 143 lb (64.9 kg)   BMI 24.55 kg/m  No acute distress elderly white woman very vigorous and spry  15 minutes time spent with patient > half in counseling coordination of care

## 2019-07-15 NOTE — Patient Instructions (Signed)
You have been scheduled for an endoscopy. Please follow written instructions given to you at your visit today. If you use inhalers (even only as needed), please bring them with you on the day of your procedure.  STAY ON YOUR PLAVIX.   I appreciate the opportunity to care for you. Silvano Rusk, MD, [facility]

## 2019-07-16 ENCOUNTER — Encounter: Payer: Self-pay | Admitting: Internal Medicine

## 2019-07-17 ENCOUNTER — Ambulatory Visit (INDEPENDENT_AMBULATORY_CARE_PROVIDER_SITE_OTHER): Payer: Medicare Other

## 2019-07-17 DIAGNOSIS — Z1159 Encounter for screening for other viral diseases: Secondary | ICD-10-CM | POA: Diagnosis not present

## 2019-07-18 LAB — SARS CORONAVIRUS 2 (TAT 6-24 HRS): SARS Coronavirus 2: NEGATIVE

## 2019-07-21 ENCOUNTER — Encounter: Payer: Self-pay | Admitting: Internal Medicine

## 2019-07-21 ENCOUNTER — Ambulatory Visit (AMBULATORY_SURGERY_CENTER): Payer: Medicare Other | Admitting: Internal Medicine

## 2019-07-21 ENCOUNTER — Other Ambulatory Visit: Payer: Self-pay

## 2019-07-21 VITALS — BP 134/59 | HR 56 | Temp 97.9°F | Resp 22 | Ht 64.0 in | Wt 143.0 lb

## 2019-07-21 DIAGNOSIS — R1013 Epigastric pain: Secondary | ICD-10-CM

## 2019-07-21 DIAGNOSIS — K219 Gastro-esophageal reflux disease without esophagitis: Secondary | ICD-10-CM | POA: Diagnosis not present

## 2019-07-21 MED ORDER — SODIUM CHLORIDE 0.9 % IV SOLN: 500.0000 mL | Freq: Once | INTRAVENOUS | Status: DC

## 2019-07-21 NOTE — Op Note (Addendum)
[place] Patient Name: Christina Silva Procedure Date: 07/21/2019 9:51 AM MRN: [id number] Endoscopist: Gatha Mayer , MD Age: 85 Referring MD:  Date of Birth: 08-30-1932 Gender: Female Account #: [id number] Procedure:                Upper GI endoscopy Indications:              Epigastric abdominal pain Medicines:                Propofol per Anesthesia, Monitored Anesthesia Care Procedure:                Pre-Anesthesia Assessment:                           - Prior to the procedure, a History and Physical                            was performed, and patient medications and                            allergies were reviewed. The patient's tolerance of                            previous anesthesia was also reviewed. The risks                            and benefits of the procedure and the sedation                            options and risks were discussed with the patient.                            All questions were answered, and informed consent                            was obtained. Prior Anticoagulants: The patient                            last took Plavix (clopidogrel) on the day of the                            procedure. ASA Grade Assessment: III - A patient                            with severe systemic disease. After reviewing the                            risks and benefits, the patient was deemed in                            satisfactory condition to undergo the procedure.                           After obtaining informed consent, the endoscope was  passed under direct vision. Throughout the                            procedure, the patient's blood pressure, pulse, and                            oxygen saturations were monitored continuously. The                            Endoscope was introduced through the mouth, and                            advanced to the second part of duodenum. The upper   GI endoscopy was accomplished without difficulty.                            The patient tolerated the procedure well. Scope In: Scope Out: Findings:                 The esophagus was normal.                           The stomach was normal.                           The examined duodenum was normal.                           The cardia and gastric fundus were normal on                            retroflexion. Complications:            No immediate complications. Estimated Blood Loss:     Estimated blood loss: none. Impression:               - Normal esophagus.                           - Normal stomach.                           - Normal examined duodenum.                           - No specimens collected. Recommendation:           - Patient has a contact number available for                            emergencies. The signs and symptoms of potential                            delayed complications were discussed with the                            patient. Return to normal activities tomorrow.  Written discharge instructions were provided to the                            patient.                           - Resume previous diet.                           - Continue present medications.                           - Will consider referral to surgeon re: gallstones                            - discussed in recovery - she will observe - if                            recurrent sxs will call and we will refer to surgeon Gatha Mayer, MD 07/21/2019 10:45:37 AM This report has been signed electronically.

## 2019-07-21 NOTE — Progress Notes (Signed)
Temp check by:JB Vital check by:  The medical and surgical history was reviewed and verified with the patient.

## 2019-07-21 NOTE — Patient Instructions (Addendum)
The exam was normal.If you have any more symptoms, please call us.  We will refer you to a surgeon.  Thank-you for choosing [place] for your healthcare needs today.  YOU HAD AN ENDOSCOPIC PROCEDURE TODAY AT [facility]:   Refer to the procedure report that was given to you for any specific questions about what was found during the examination.  If the procedure report does not answer your questions, please call your gastroenterologist to clarify.  If you requested that your care partner not be given the details of your procedure findings, then the procedure report has been included in a sealed envelope for you to review at your convenience later.  YOU SHOULD EXPECT: Some feelings of bloating in the abdomen. Passage of more gas than usual.  Walking can help get rid of the air that was put into your GI tract during the procedure and reduce the bloating. If you had a lower endoscopy (such as a colonoscopy or flexible sigmoidoscopy) you may notice spotting of blood in your stool or on the toilet paper. If you underwent a bowel prep for your procedure, you may not have a normal bowel movement for a few days.  Please Note:  You might notice some irritation and congestion in your nose or some drainage.  This is from the oxygen used during your procedure.  There is no need for concern and it should clear up in a day or so.  SYMPTOMS TO REPORT IMMEDIATELY:    Following upper endoscopy (EGD)  Vomiting of blood or coffee ground material  New chest pain or pain under the shoulder blades  Painful or persistently difficult swallowing  New shortness of breath  Fever of 100F or higher  Black, tarry-looking stools  For urgent or emergent issues, a gastroenterologist can be reached at any hour by calling [phone].   DIET:  We do recommend a small meal at first, but then you may proceed to your regular diet.  Drink plenty of fluids but you should avoid alcoholic beverages for 24  hours.  ACTIVITY:  You should plan to take it easy for the rest of today and you should NOT DRIVE or use heavy machinery until tomorrow (because of the sedation medicines used during the test).    FOLLOW UP: Our staff will call the number listed on your records 48-72 hours following your procedure to check on you and address any questions or concerns that you may have regarding the information given to you following your procedure. If we do not reach you, we will leave a message.  We will attempt to reach you two times.  During this call, we will ask if you have developed any symptoms of COVID 19. If you develop any symptoms (ie: fever, flu-like symptoms, shortness of breath, cough etc.) before then, please call [phone].  If you test positive for Covid 19 in the 2 weeks post procedure, please call and report this information to [place].    If any biopsies were taken you will be contacted by phone or by letter within the next 1-3 weeks.  Please call us at [phone] if you have not heard about the biopsies in 3 weeks.    SIGNATURES/CONFIDENTIALITY: You and/or your care partner have signed paperwork which will be entered into your electronic medical record.  These signatures attest to the fact that that the information above on your After Visit Summary has been reviewed and is understood.  Full responsibility of the confidentiality of  this discharge information lies with you and/or your care-partner.

## 2019-07-23 ENCOUNTER — Telehealth: Payer: Self-pay

## 2019-07-23 NOTE — Telephone Encounter (Signed)
Called [phone] and thew was no answer.  Unable to leave a message we tried to reach pt for a follow up call. maw

## 2019-07-23 NOTE — Telephone Encounter (Signed)
  Follow up Call-  Call back number 07/21/2019  Post procedure Call Back phone  # [phone]  Permission to leave phone message Yes  Some recent data might be hidden     Patient questions:  Do you have a fever, pain , or abdominal swelling? No. Pain Score  0 *  Have you tolerated food without any problems? Yes.    Have you been able to return to your normal activities? Yes.    Do you have any questions about your discharge instructions: Diet   No. Medications  No. Follow up visit  No.  Do you have questions or concerns about your Care? No.  Actions: * If pain score is 4 or above: No action needed, pain <4.  1. Have you developed a fever since your procedure? no  2.   Have you had an respiratory symptoms (SOB or cough) since your procedure? no  3.   Have you tested positive for COVID 19 since your procedure no  4.   Have you had any family members/close contacts diagnosed with the COVID 19 since your procedure?  no   If yes to any of these questions please route to Joylene John, RN and Alphonsa Gin, Therapist, sports.

## 2019-07-30 ENCOUNTER — Telehealth: Payer: Self-pay | Admitting: Nurse Practitioner

## 2019-07-30 NOTE — Chronic Care Management (AMB) (Signed)
°  Chronic Care Management   Outreach Note  07/30/2019 Name: Christina Silva MRN: [id number] DOB: [birth year]  Referred by: [name], FNP Reason for referral : Chronic Care Management (Initial CCM outreach was unsuccessful. )   An unsuccessful telephone outreach was attempted today. The patient was referred to the case management team by for assistance with care management and care coordination.   Follow Up Plan: The care management team will reach out to the patient again over the next 7 days.   [place], [place] [address] Direct Dial: [email or web address]  Website: [email or web address]

## 2019-08-08 ENCOUNTER — Other Ambulatory Visit: Payer: Self-pay

## 2019-08-11 ENCOUNTER — Ambulatory Visit: Payer: Self-pay | Admitting: Nurse Practitioner

## 2019-08-11 NOTE — Chronic Care Management (AMB) (Signed)
  Chronic Care Management   Note  08/11/2019 Name: DEANZA UPPERMAN MRN: [id number] DOB: 1933/03/04  MISAKO ROEDER is a 85 y.o. year old female who is a primary care patient of [name], [place]. I reached out to [name] by phone today in response to a referral sent by Ms. Noland Fordyce Wunder's health plan.     Ms. Peregrina was given information about Chronic Care Management services today including:  1. CCM service includes personalized support from designated clinical staff supervised by her physician, including individualized plan of care and coordination with other care providers 2. 24/7 contact phone numbers for assistance for urgent and routine care needs. 3. Service will only be billed when office clinical staff spend 20 minutes or more in a month to coordinate care. 4. Only one practitioner may furnish and bill the service in a calendar month. 5. The patient may stop CCM services at any time (effective at the end of the month) by phone call to the office staff. 6. The patient will be responsible for cost sharing (co-pay) of up to 20% of the service fee (after annual deductible is met).  Patient did not agree to services and wishes to discuss CCM services with PCP first before deciding about enrollment in care management services.   Follow up plan: The care management team is available to follow up with the patient after provider conversation with the patient regarding recommendation for care management engagement and subsequent re-referral to the care management team.   [place], [place], [place] [address] Direct Dial: [email or web address]  Website: [email or web address]

## 2019-08-14 ENCOUNTER — Ambulatory Visit: Payer: Medicare Other | Admitting: Nurse Practitioner

## 2019-09-01 ENCOUNTER — Ambulatory Visit: Payer: Medicare Other

## 2019-09-08 ENCOUNTER — Telehealth: Payer: Self-pay | Admitting: *Deleted

## 2019-09-08 NOTE — Telephone Encounter (Signed)
Pt would like for Shelah Lewandowsky to call her in reference to the Covid Vaccine. She has an appt but would like to speak to you because you know her health status so well.

## 2019-09-08 NOTE — Telephone Encounter (Signed)
Spoke with patient and recommmended that she take covid vaccine

## 2019-09-19 ENCOUNTER — Telehealth: Payer: Self-pay | Admitting: Nurse Practitioner

## 2019-09-19 NOTE — Telephone Encounter (Signed)
Needs to wait 45 days after 2nd covid shot

## 2019-09-19 NOTE — Telephone Encounter (Signed)
Patient aware.

## 2019-09-24 ENCOUNTER — Encounter: Payer: Self-pay | Admitting: Nurse Practitioner

## 2019-09-24 ENCOUNTER — Ambulatory Visit (INDEPENDENT_AMBULATORY_CARE_PROVIDER_SITE_OTHER): Payer: Medicare Other | Admitting: Nurse Practitioner

## 2019-09-24 ENCOUNTER — Other Ambulatory Visit: Payer: Self-pay

## 2019-09-24 VITALS — BP 119/63 | HR 74 | Temp 97.2°F | Resp 20 | Ht 64.0 in | Wt 136.0 lb

## 2019-09-24 DIAGNOSIS — F411 Generalized anxiety disorder: Secondary | ICD-10-CM

## 2019-09-24 DIAGNOSIS — I10 Essential (primary) hypertension: Secondary | ICD-10-CM

## 2019-09-24 DIAGNOSIS — E785 Hyperlipidemia, unspecified: Secondary | ICD-10-CM | POA: Diagnosis not present

## 2019-09-24 DIAGNOSIS — M8588 Other specified disorders of bone density and structure, other site: Secondary | ICD-10-CM

## 2019-09-24 DIAGNOSIS — I639 Cerebral infarction, unspecified: Secondary | ICD-10-CM

## 2019-09-24 DIAGNOSIS — K219 Gastro-esophageal reflux disease without esophagitis: Secondary | ICD-10-CM | POA: Diagnosis not present

## 2019-09-24 DIAGNOSIS — R739 Hyperglycemia, unspecified: Secondary | ICD-10-CM

## 2019-09-24 MED ORDER — LOSARTAN POTASSIUM 100 MG PO TABS: 100.0000 mg | ORAL_TABLET | Freq: Every day | ORAL | 1 refills | Status: DC

## 2019-09-24 MED ORDER — PANTOPRAZOLE SODIUM 40 MG PO TBEC: 40.0000 mg | DELAYED_RELEASE_TABLET | Freq: Every day | ORAL | 3 refills | Status: DC

## 2019-09-24 MED ORDER — AMLODIPINE BESYLATE 2.5 MG PO TABS: 2.5000 mg | ORAL_TABLET | Freq: Every day | ORAL | 6 refills | Status: DC

## 2019-09-24 MED ORDER — DIAZEPAM 2 MG PO TABS: 2.0000 mg | ORAL_TABLET | Freq: Two times a day (BID) | ORAL | 2 refills | Status: DC | PRN

## 2019-09-24 MED ORDER — CLOPIDOGREL BISULFATE 75 MG PO TABS: 75.0000 mg | ORAL_TABLET | Freq: Every day | ORAL | 1 refills | Status: DC

## 2019-09-24 NOTE — Patient Instructions (Signed)

## 2019-09-24 NOTE — Addendum Note (Signed)
Addended by: [name] on: 09/24/2019 02:56 PM   Modules accepted: Orders

## 2019-09-24 NOTE — Progress Notes (Signed)
Subjective:    Patient ID: Christina Silva, female    DOB: Nov 26, 1932, 86 y.o.   MRN: [id number]   Chief Complaint: Medical Management of Chronic Issues    HPI:  1. Essential hypertension Pain in chest she thinks is from gall bladder. Previous trips to ED for chest pain before CVA in February 2020 for chest pain determined to be from stress. Checks BP at home and has been good. Occasional dizziness when getting up quickly.  2. Hyperlipidemia with target LDL less than 100 Not taking cholesterol medication because it was causing pain in her muscles. Has not been on a low fat diet.  3. Acute CVA (cerebrovascular accident) ([place]) No permanent deficits. Some episodes of TIA since then. Takes plavix and 81 mg ASA.  4. Gastroesophageal reflux disease without esophagitis Has had more episodes of heartburn recently. Avoids spicy foods and tomato products.  5. Anxiety state Anxious when having pain and over personal and family issues. Listening to music and watching tv helps to relax. GAD 7 : Generalized Anxiety Score 09/24/2019 11/27/2017  Nervous, Anxious, on Edge 1 3  Control/stop worrying 0 3  Worry too much - different things 0 3  Trouble relaxing 1 3  Restless 0 3  Easily annoyed or irritable 0 3  Afraid - awful might happen 1 2  Total GAD 7 Score 3 20  Anxiety Difficulty Not difficult at all Very difficult      6. Hyperglycemia Does not check blood sugar at home. Blurred vision at times. No swelling, numbness, or tingling in legs.  7. Osteopenia of spine Has aged out of [place].    Outpatient Encounter Medications as of 09/24/2019  Medication Sig  . alum & mag hydroxide-simeth (MAALOX MAX) 400-400-40 MG/5ML suspension Take 10 mLs by mouth every 6 (six) hours as needed for indigestion.  . pantoprazole (PROTONIX) 40 MG tablet Take 1 tablet (40 mg total) by mouth daily before breakfast.  . amLODipine (NORVASC) 2.5 MG tablet Take 1 tablet (2.5 mg total) by mouth daily.  Marland Kitchen  aspirin EC 81 MG tablet Take 81 mg by mouth daily.  . Cholecalciferol (VITAMIN D3) 50 MCG (2000 UT) capsule Take 2,000 Units by mouth daily.  . clopidogrel (PLAVIX) 75 MG tablet Take 1 tablet (75 mg total) by mouth daily.  . Cyanocobalamin (VITAMIN B 12 PO) Take 1,000 mg by mouth 2 (two) times daily.   . diazepam (VALIUM) 2 MG tablet Take 1 tablet (2 mg total) by mouth daily as needed.  Marland Kitchen losartan (COZAAR) 100 MG tablet TAKE ONE (1) TABLET EACH DAY (Patient not taking: Reported on 07/21/2019)   No facility-administered encounter medications on file as of 09/24/2019.    Past Surgical History:  Procedure Laterality Date  . ABDOMINAL HYSTERECTOMY  1980  . CATARACT EXTRACTION Right   . EYE SURGERY    . KNEE ARTHROSCOPY Left   . ROTATOR CUFF REPAIR Left   . TONSILLECTOMY      Family History  Problem Relation Age of Onset  . Heart attack Mother   . Hyperlipidemia Mother   . Heart disease Mother   . Transient ischemic attack Father   . Stroke Father   . Psychiatric Illness Sister   . Heart disease Sister   . Arthritis Brother   . Epilepsy Brother   . Colon cancer Neg Hx   . Esophageal cancer Neg Hx   . Rectal cancer Neg Hx   . Stomach cancer Neg Hx  New complaints: Stomach pain. Saw doctor for gall bladder issues who wants to do surgery but she does not want to be in the hospital right now.  Social history: Lives alone. Has daughter and son that she sees frequently.   Controlled substance contract: 09/24/19    Review of Systems  Constitutional: Negative for diaphoresis.  Eyes: Negative for pain.  Respiratory: Negative for shortness of breath.   Cardiovascular: Negative for chest pain, palpitations and leg swelling.  Gastrointestinal: Negative for abdominal pain.  Endocrine: Negative for polydipsia.  Skin: Negative for rash.  Neurological: Negative for dizziness, weakness and headaches.  Hematological: Does not bruise/bleed easily.  All other systems reviewed and are  negative.      Objective:   Physical Exam Vitals and nursing note reviewed.  Constitutional:      General: She is not in acute distress.    Appearance: Normal appearance. She is well-developed.  HENT:     Head: Normocephalic.     Nose: Nose normal.  Eyes:     Pupils: Pupils are equal, round, and reactive to light.  Neck:     Vascular: No carotid bruit or JVD.  Cardiovascular:     Rate and Rhythm: Normal rate and regular rhythm.     Heart sounds: Normal heart sounds.  Pulmonary:     Effort: Pulmonary effort is normal. No respiratory distress.     Breath sounds: Normal breath sounds. No wheezing or rales.  Chest:     Chest wall: No tenderness.  Abdominal:     General: Bowel sounds are normal. There is no distension or abdominal bruit.     Palpations: Abdomen is soft. There is no hepatomegaly, splenomegaly, mass or pulsatile mass.     Tenderness: There is no abdominal tenderness.  Musculoskeletal:        General: Normal range of motion.     Cervical back: Normal range of motion and neck supple.  Lymphadenopathy:     Cervical: No cervical adenopathy.  Skin:    General: Skin is warm and dry.  Neurological:     General: No focal deficit present.     Mental Status: She is alert and oriented to person, place, and time.     Cranial Nerves: No cranial nerve deficit.     Sensory: No sensory deficit.     Deep Tendon Reflexes: Reflexes are normal and symmetric. Reflexes normal.  Psychiatric:        Behavior: Behavior normal.        Thought Content: Thought content normal.        Judgment: Judgment normal.    BP 119/63   Pulse 74   Temp (!) 97.2 F (36.2 C) (Temporal)   Resp 20   Ht 5\' 4"  (1.626 m)   Wt 136 lb (61.7 kg)   SpO2 97%   BMI 23.34 kg/m         Assessment & Plan:  [name] comes in today with chief complaint of Medical Management of Chronic Issues   Diagnosis and orders addressed:  1. Essential hypertension Low sodium diet - losartan  (COZAAR) 100 MG tablet; Take 1 tablet (100 mg total) by mouth daily.  Dispense: 90 tablet; Refill: 1 - amLODipine (NORVASC) 2.5 MG tablet; Take 1 tablet (2.5 mg total) by mouth daily.  Dispense: 30 tablet; Refill: 6  2. Hyperlipidemia with target LDL less than 100 Low fat diet  3. Acute CVA (cerebrovascular accident) ([place]) - clopidogrel (PLAVIX) 75 MG tablet; Take 1  tablet (75 mg total) by mouth daily.  Dispense: 30 tablet; Refill: 1  4. Gastroesophageal reflux disease without esophagitis Avoid spicy foods Do not eat 2 hours prior to bedtime - pantoprazole (PROTONIX) 40 MG tablet; Take 1 tablet (40 mg total) by mouth daily before breakfast.  Dispense: 90 tablet; Refill: 3  5. Anxiety state Stress management - diazepam (VALIUM) 2 MG tablet; Take 1 tablet (2 mg total) by mouth 2 (two) times daily as needed.  Dispense: 60 tablet; Refill: 2  6. Hyperglycemia Watch carbs n diet  7. Osteopenia of spine Weight bearing exercises   Labs pending Health Maintenance reviewed Diet and exercise encouraged  Follow up plan: 6 months   [place], FNP

## 2019-09-24 NOTE — Addendum Note (Signed)
Addended by: [name] on: 09/24/2019 12:23 PM   Modules accepted: Orders

## 2019-09-25 ENCOUNTER — Telehealth: Payer: Self-pay | Admitting: Internal Medicine

## 2019-09-25 LAB — CMP14+EGFR
ALT: 14 IU/L (ref 0–32)
AST: 20 IU/L (ref 0–40)
Albumin/Globulin Ratio: 1.8 (ref 1.2–2.2)
Albumin: 4.4 g/dL (ref 3.6–4.6)
Alkaline Phosphatase: 92 IU/L (ref 39–117)
BUN/Creatinine Ratio: 15 (ref 12–28)
BUN: 14 mg/dL (ref 8–27)
Bilirubin Total: 0.7 mg/dL (ref 0.0–1.2)
CO2: 26 mmol/L (ref 20–29)
Calcium: 10.5 mg/dL — ABNORMAL HIGH (ref 8.7–10.3)
Chloride: 102 mmol/L (ref 96–106)
Creatinine, Ser: 0.93 mg/dL (ref 0.57–1.00)
GFR calc Af Amer: 65 mL/min/{1.73_m2} (ref >59)
GFR calc non Af Amer: 57 mL/min/{1.73_m2} — ABNORMAL LOW (ref >59)
Globulin, Total: 2.5 g/dL (ref 1.5–4.5)
Glucose: 95 mg/dL (ref 65–99)
Potassium: 4 mmol/L (ref 3.5–5.2)
Sodium: 144 mmol/L (ref 134–144)
Total Protein: 6.9 g/dL (ref 6.0–8.5)

## 2019-09-25 LAB — LIPID PANEL
Chol/HDL Ratio: 4 ratio (ref 0.0–4.4)
Cholesterol, Total: 246 mg/dL — ABNORMAL HIGH (ref 100–199)
HDL: 62 mg/dL (ref >39)
LDL Chol Calc (NIH): 153 mg/dL — ABNORMAL HIGH (ref 0–99)
Triglycerides: 171 mg/dL — ABNORMAL HIGH (ref 0–149)
VLDL Cholesterol Cal: 31 mg/dL (ref 5–40)

## 2019-09-25 NOTE — Telephone Encounter (Signed)
Spoke to the patient's daughter who reports an "episode" on Tuesday. Vomiting "black bile," excruciating pain. The daughter stated she wanted the referral mentioned in the procedure report.   Referral faxed to [facility].

## 2019-09-29 ENCOUNTER — Ambulatory Visit: Payer: Medicare Other

## 2019-10-08 ENCOUNTER — Telehealth: Payer: Self-pay | Admitting: Nurse Practitioner

## 2019-10-08 NOTE — Telephone Encounter (Signed)
Please mail last lab results to patient.

## 2019-10-08 NOTE — Telephone Encounter (Signed)
[place] labs to pt

## 2019-10-13 ENCOUNTER — Ambulatory Visit: Payer: Self-pay | Admitting: Surgery

## 2019-10-13 DIAGNOSIS — K8020 Calculus of gallbladder without cholecystitis without obstruction: Secondary | ICD-10-CM | POA: Diagnosis not present

## 2019-10-13 NOTE — H&P (Signed)
Christina Silva Documented: 10/13/2019 2:12 PM Location: [facility] Patient #: [id number] DOB: 1932/09/11 Widowed / Language: Christina Silva / Race: White Female  History of Present Illness Christina Silva A. Christina Jolliff MD; 10/13/2019 2:42 PM) Patient words: Patient sent at the request of Dr. Carlean Purl of lower gastrointestinal for gallstones. The patient was seen last all wrist in the emergency room for chest pain. Workup included a cardiac workup which was negative but she was found to have gallstones on ultrasound. She has intermittent history of dyspepsia, heartburn, epigastric abdominal pain after eating which is fairly constant each month. She also has bouts of exacerbation of nausea and vomiting after fatty meals. Upper endoscopy by Dr. Carlean Purl was normal. She denies yellowing of the eyes the skin. Fatty foods seem to make her symptoms worse.           RIGHT upper quadrant pain for 1 month  EXAM: ULTRASOUND ABDOMEN LIMITED RIGHT UPPER QUADRANT  COMPARISON: None.  FINDINGS: Gallbladder:  Multiple echogenic gallstones fill the lumen the gallbladder measuring up to 1 cm. Posterior wall shadowing. No gallbladder distention or pericholecystic fluid. Negative sonographic Murphy's sign.  Common bile duct:  Diameter: Upper limits of normal at 6 mm  Liver:  Liver has a nodular contour. There is mild increase in liver echogenicity. No biliary duct dilatation portal vein is patent on color Doppler imaging with normal direction of blood flow towards the liver.  Other: No free fluid  IMPRESSION: 1. Cholelithiasis without evidence acute cholecystitis. 2. Nodule liver and increased liver echogenicity could represent hepatocellular disease such as cirrhosis. 3. No biliary duct dilatation. Common bile duct upper limits of normal.   Electronically Signed By: Suzy Bouchard M.D. On: 04/09/2019 17:44.  The patient is a 86 year old female.   Past Surgical History  (Christina Silva, [place]; 10/13/2019 2:12 PM) Appendectomy Cataract Surgery Bilateral. Hysterectomy (not due to cancer) - Complete  Diagnostic Studies History (Christina Silva, CMA; 10/13/2019 2:12 PM) Colonoscopy 5-10 years ago Mammogram >3 years ago Pap Smear >5 years ago  Allergies (Christina Silva, CMA; 10/13/2019 2:13 PM) Codeine Phosphate *ANALGESICS - OPIOID* Morphine Sulfate (Concentrate) *ANALGESICS - OPIOID* Sulfa Antibiotics Lisinopril & Diet Manage Prod *ANTIHYPERTENSIVES* Allergies Reconciled  Social History Christina Silva, CMA; 10/13/2019 2:12 PM) No alcohol use No drug use  Family History (Christina Silva, CMA; 10/13/2019 2:12 PM) Alcohol Abuse Father.  Pregnancy / Birth History Christina Silva, [place]; 10/13/2019 2:12 PM) Age at menarche 54 years. Gravida 2 Maternal age 23-25 Para 2 Regular periods  Other Problems (Christina Silva, CMA; 10/13/2019 2:12 PM) Anxiety Disorder Cerebrovascular Accident Cholelithiasis High blood pressure Hypercholesterolemia Oophorectomy     Review of Systems (Christina Silva CMA; 10/13/2019 2:12 PM) General Not Present- Appetite Loss, Chills, Fatigue, Fever, Night Sweats, Weight Gain and Weight Loss. HEENT Present- Seasonal Allergies and Wears glasses/contact lenses. Not Present- Earache, Hearing Loss, Hoarseness, Nose Bleed, Oral Ulcers, Ringing in the Ears, Sinus Pain, Sore Throat, Visual Disturbances and Yellow Eyes. Gastrointestinal Present- Abdominal Pain and Indigestion. Not Present- Bloating, Bloody Stool, Change in Bowel Habits, Chronic diarrhea, Constipation, Difficulty Swallowing, Excessive gas, Gets full quickly at meals, Hemorrhoids, Nausea, Rectal Pain and Vomiting. Musculoskeletal Present- Joint Pain. Not Present- Back Pain, Joint Stiffness, Muscle Pain, Muscle Weakness and Swelling of Extremities. Psychiatric Present- Anxiety. Not Present- Bipolar, Change in Sleep Pattern, Depression, Fearful and Frequent  crying. Endocrine Not Present- Cold Intolerance, Excessive Hunger, Hair Changes, Heat Intolerance, Hot flashes and New Diabetes. Hematology Present- Blood Thinners. Not Present- Easy Bruising, Excessive bleeding,  Gland problems, HIV and Persistent Infections.  Vitals (Christina Silva CMA; 10/13/2019 2:13 PM) 10/13/2019 2:13 PM Weight: 137.5 lb Height: 64.5in Body Surface Area: 1.68 m Body Mass Index: 23.24 kg/m  Temp.: 98.81F  Pulse: 93 (Regular)  BP: 120/70 (Sitting, Left Arm, Standard)        Physical Exam (Abriana Saltos A. Alianny Toelle MD; 10/13/2019 2:42 PM)  General Mental Status-Alert. General Appearance-Consistent with stated age. Hydration-Well hydrated. Voice-Normal.  Head and Neck Head-normocephalic, atraumatic with no lesions or palpable masses.  Eye Eyeball - Bilateral-Extraocular movements intact. Sclera/Conjunctiva - Bilateral-No scleral icterus.  Chest and Lung Exam Chest and lung exam reveals -quiet, even and easy respiratory effort with no use of accessory muscles and on auscultation, normal breath sounds, no adventitious sounds and normal vocal resonance. Inspection Chest Wall - Normal. Back - normal.  Cardiovascular Cardiovascular examination reveals -on palpation PMI is normal in location and amplitude, no palpable S3 or S4. Normal cardiac borders., normal heart sounds, regular rate and rhythm with no murmurs, carotid auscultation reveals no bruits and normal pedal pulses bilaterally.  Abdomen Inspection Inspection of the abdomen reveals - No Hernias. Skin - Scar - no surgical scars. Palpation/Percussion Palpation and Percussion of the abdomen reveal - Soft, Non Tender, No Rebound tenderness, No Rigidity (guarding) and No hepatosplenomegaly. Auscultation Auscultation of the abdomen reveals - Bowel sounds normal.  Neurologic Neurologic evaluation reveals -alert and oriented x 3 with no impairment of recent or remote memory. Mental  Status-Normal.  Musculoskeletal Normal Exam - Left-Upper Extremity Strength Normal and Lower Extremity Strength Normal. Normal Exam - Right-Upper Extremity Strength Normal, Lower Extremity Weakness.    Assessment & Plan (Lindley Stachnik A. Ladonne Sharples MD; 10/13/2019 2:42 PM)  SYMPTOMATIC CHOLELITHIASIS (K80.20) Impression: Discussed laparoscopic cholecystectomy with possible cholangiogram. Discussed nonoperative management. She has opted for laparoscopic cholecystectomy for symptomatic cholelithiasis The procedure has been discussed with the patient. Risks of laparoscopic cholecystectomy include bleeding, infection, bile duct injury, leak, death, open surgery, diarrhea, other surgery, organ injury, blood vessel injury, DVT, and additional care.  Total time 30 minutes for documentation, examination, discussion of surgery, discussion of complications, discussion of other options of treatment  Current Plans You are being scheduled for surgery- Our schedulers will call you.  You should hear from our office's scheduling department within 5 working days about the location, date, and time of surgery. We try to make accommodations for patient's preferences in scheduling surgery, but sometimes the OR schedule or the surgeon's schedule prevents [place] from making those accommodations.  If you have not heard from our office [phone]) in 5 working days, call the office and ask for your surgeon's nurse.  If you have other questions about your diagnosis, plan, or surgery, call the office and ask for your surgeon's nurse.  Pt Education - [facility] Good Bowel Health ([place]) The anatomy & physiology of hepatobiliary & pancreatic function was discussed. The pathophysiology of gallbladder dysfunction was discussed. Natural history risks without surgery was discussed. I feel the risks of no intervention will lead to serious problems that outweigh the operative risks; therefore, I recommended cholecystectomy to remove  the pathology. I explained laparoscopic techniques with possible need for an open approach. Probable cholangiogram to evaluate the bilary tract was explained as well.  Risks such as bleeding, infection, abscess, leak, injury to other organs, need for further treatment, heart attack, death, and other risks were discussed. I noted a good likelihood this will help address the problem. Possibility that this will not correct all abdominal symptoms was explained.  Goals of post-operative recovery were discussed as well. We will work to minimize complications. An educational handout further explaining the pathology and treatment options was given as well. Questions were answered. The patient expresses understanding & wishes to proceed with surgery.

## 2019-10-21 ENCOUNTER — Telehealth: Payer: Self-pay | Admitting: Nurse Practitioner

## 2019-10-21 NOTE — Telephone Encounter (Signed)
Line busy

## 2019-10-21 NOTE — Telephone Encounter (Signed)
How long after getting second Covid vaccine should patient wait to get second Shingles vaccine?

## 2019-10-21 NOTE — Telephone Encounter (Signed)
45 days

## 2019-10-22 ENCOUNTER — Telehealth: Payer: Self-pay | Admitting: Nurse Practitioner

## 2019-10-22 NOTE — Telephone Encounter (Signed)
No answer, no voicemail.

## 2019-10-24 NOTE — Telephone Encounter (Signed)
Aware.Can wait for awhile longer to get shingles injection.  Data says a minimum of two weeks after covid shot but spacing further probably advised,

## 2019-10-30 ENCOUNTER — Telehealth: Payer: Self-pay | Admitting: *Deleted

## 2019-10-30 NOTE — Telephone Encounter (Signed)
Patient arrived for [facility] referral appt on 2/15 to see Dr. Brantley Stage.

## 2019-10-31 NOTE — Progress Notes (Signed)
Cardiology Office Note   Date:  11/03/2019   ID:  Christina Silva, DOB 26-Sep-1932, MRN [id number]  PCP:  [name], FNP  Cardiologist:  Demira Gwynne [place], MD EP: None  Chief Complaint  Patient presents with  . Chest Pain      History of Present Illness: Christina Silva is a 86 y.o. female with a PMH of HTN, HLD, CVA, GERD, anxiety, and vertigo who presents for follow up of chest pain.  She was  evaluated by me in 12/2017 to establish care for management of HTN and LE edema. She was recommended to undergo an echocardiogram which showed EF 55-60%, G1DD, mild AI, and mild-moderate MR/TR. In February 2020 she suffered a CVA and was recommended for aspirin and plavix x1 month, then aspirin alone daily. Repeat echo 09/2018 with EF 60-65%, G1DD, basal septal hypertrophy, mild AI, moderate aortic annular calcifications, and mild mitral annular calcifications. She had a TIA 02/2019 and was restarted on plavix.  She was seen in the ED 04/09/2019 with complaints of atypical chest pain with negative troponins and EKG. She was prescribed pantoprazole and recommended to follow-up with cardiology.  Nuclear stress test was done and was normal. She was started on amlodipine for HTN.   She was previously taking lipitor 40 mg for cholesterol but stopped taking it due to myalgias and cramps.   She has been diagnosed with gallstones and does have intermittent biliary colic with N/V, chest pain and indigestion. Scheduled to see Dr Malachi Paradise for surgery.    Past Medical History:  Diagnosis Date  . Anxiety   . Cataract   . GERD (gastroesophageal reflux disease)   . Hyperlipidemia   . Hypertension   . Vertigo    patient denies    Past Surgical History:  Procedure Laterality Date  . ABDOMINAL HYSTERECTOMY  1980  . CATARACT EXTRACTION Right   . EYE SURGERY    . KNEE ARTHROSCOPY Left   . ROTATOR CUFF REPAIR Left   . TONSILLECTOMY       Current Outpatient Medications  Medication Sig  Dispense Refill  . alum & mag hydroxide-simeth (MAALOX MAX) 400-400-40 MG/5ML suspension Take 10 mLs by mouth every 6 (six) hours as needed for indigestion. 355 mL 0  . amLODipine (NORVASC) 2.5 MG tablet Take 1 tablet (2.5 mg total) by mouth daily. 30 tablet 6  . aspirin EC 81 MG tablet Take 81 mg by mouth daily.    . Cholecalciferol (VITAMIN D3) 50 MCG (2000 UT) capsule Take 2,000 Units by mouth daily.    . clopidogrel (PLAVIX) 75 MG tablet Take 1 tablet (75 mg total) by mouth daily. 30 tablet 1  . Cyanocobalamin (VITAMIN B 12 PO) Take 1,000 mg by mouth 2 (two) times daily.     . diazepam (VALIUM) 2 MG tablet Take 1 tablet (2 mg total) by mouth 2 (two) times daily as needed. 60 tablet 2  . losartan (COZAAR) 100 MG tablet Take 1 tablet (100 mg total) by mouth daily. 90 tablet 1  . pantoprazole (PROTONIX) 40 MG tablet Take 1 tablet (40 mg total) by mouth daily before breakfast. 90 tablet 3  . rosuvastatin (CRESTOR) 5 MG tablet Take 1 tablet (5 mg total) by mouth daily. 90 tablet 3   No current facility-administered medications for this visit.    Allergies:   Codeine, Lisinopril, Morphine, Sulfa antibiotics, and Sulfonamide derivatives    Social History:  The patient  reports that she quit smoking about 36 years  ago. Her smoking use included cigarettes. She has never used smokeless tobacco. She reports that she does not drink alcohol or use drugs.   Family History:  The patient's family history includes Arthritis in her brother; Epilepsy in her brother; Heart attack in her mother; Heart disease in her mother and sister; Hyperlipidemia in her mother; Psychiatric Illness in her sister; Stroke in her father; Transient ischemic attack in her father.    ROS:  Please see the history of present illness.   Otherwise, review of systems are positive for none.   All other systems are reviewed and negative.    PHYSICAL EXAM: VS:  BP (!) 144/75   Pulse 75   Temp (!) 97.3 F (36.3 C)   Resp (!) 21    Ht 5\' 4"  (1.626 m)   Wt 138 lb 12.8 oz (63 kg)   SpO2 98%   BMI 23.82 kg/m  , BMI Body mass index is 23.82 kg/m. GEN: Well nourished, well developed, in no acute distress HEENT: sclera anicteric Neck: no JVD, carotid bruits, or masses Cardiac: RRR; no murmurs, rubs, or gallops, no edema  Respiratory:  clear to auscultation bilaterally, normal work of breathing GI: soft, nontender, nondistended, + BS MS: no deformity or atrophy Skin: warm and dry, no rash Neuro:  Strength and sensation are intact Psych: euthymic mood, full affect   EKG:  EKG is not ordered today.  Recent Labs: 04/09/2019: Hemoglobin 14.6; Platelets 363 09/24/2019: ALT 14; BUN 14; Creatinine, Ser 0.93; Potassium 4.0; Sodium 144    Lipid Panel Lab Results  Component Value Date   CHOL 246 (H) 09/24/2019   CHOL 167 04/09/2019   CHOL 231 (H) 10/18/2018   Lab Results  Component Value Date   HDL 62 09/24/2019   HDL 67 04/09/2019   HDL 65 10/18/2018   Lab Results  Component Value Date   LDLCALC 153 (H) 09/24/2019   LDLCALC 58 04/09/2019   LDLCALC 139 (H) 10/18/2018   Lab Results  Component Value Date   TRIG 171 (H) 09/24/2019   TRIG 212 (H) 04/09/2019   TRIG 137 10/18/2018   Lab Results  Component Value Date   CHOLHDL 4.0 09/24/2019   CHOLHDL 2.5 04/09/2019   CHOLHDL 3.6 10/18/2018   No results found for: LDLDIRECT  Wt Readings from Last 3 Encounters:  11/03/19 138 lb 12.8 oz (63 kg)  09/24/19 136 lb (61.7 kg)  07/21/19 143 lb (64.9 kg)      Other studies Reviewed: Additional studies/ records that were reviewed today include:   Echocardiogram 09/2018: IMPRESSIONS    1. The left ventricle has normal systolic function with an ejection fraction of 60-65%. The cavity size was normal. There is basal septal hypertophy. Left ventricular diastolic Doppler parameters are consistent with impaired relaxation.  2. The right ventricle has normal systolic function. The cavity was normal. There is no  increase in right ventricular wall thickness.  3. The aortic valve is tricuspid Moderate thickening of the aortic valve Moderate calcification of the aortic valve. Aortic valve regurgitation is mild by color flow Doppler.. Moderate aortic annular calcification noted.  4. The mitral valve is normal in structure. Mild thickening of the mitral valve leaflet. Mild calcification of the mitral valve leaflet. There is mild mitral annular calcification present. No evidence of mitral valve stenosis.  5. The tricuspid valve is normal in structure.  6. The pulmonic valve was grossly normal. Pulmonic valve regurgitation is mild by color flow Doppler.  7. The aortic  root is normal in size and structure.  8. Pulmonary hypertension is indeterminat,inadequate TR jet.   Myoview 05/14/19: Study Highlights    The left ventricular ejection fraction is hyperdynamic (>65%).  Nuclear stress EF: 66%.  No T wave inversion was noted during stress.  There was no ST segment deviation noted during stress.  The study is normal.  This is a low risk study.   Normal perfusion. LVEF 66% with normal wall motion. This is a low risk study.     ASSESSMENT AND PLAN:  1. Chest pain:  Due to biliary colic. Risk factors for CAD include HTN, HLD, and family history of CAD. Myoview study in September 2020 was normal. She is cleared for surgery from a cardiac standpoint. My hold Plavix 5 days prior to surgery.  2. HTN: well controlled  - Continue losartan and amlodipine  3. HLD: previously well controlled on lipitor but stopped due to myalgias. With prior CVA and TIA goal LDL < 70. Recommend she try Crestor 5 mg daily. Follow up with primary care. Could add Zetia or PCSK 9 inhibitor if not able to achieve goal with statin.   4. CVA/TIA: diagnosed with a CVA 09/2018 with TIA 02/2019.  - Continue aspirin 81mg  daily and plavix 75mg  daily   Current medicines are reviewed at length with the patient today.  The patient does  not have concerns regarding medicines.  The following changes have been made:  none  Labs/ tests ordered today include:   No orders of the defined types were placed in this encounter.    Disposition:   FU in one year  Signed, Abbiegail Landgren [place], MD  11/03/2019 11:01 AM

## 2019-11-03 ENCOUNTER — Encounter: Payer: Self-pay | Admitting: Cardiology

## 2019-11-03 ENCOUNTER — Other Ambulatory Visit: Payer: Self-pay

## 2019-11-03 ENCOUNTER — Ambulatory Visit: Payer: Medicare Other | Admitting: Cardiology

## 2019-11-03 VITALS — BP 144/75 | HR 75 | Temp 97.3°F | Resp 21 | Ht 64.0 in | Wt 138.8 lb

## 2019-11-03 DIAGNOSIS — K8020 Calculus of gallbladder without cholecystitis without obstruction: Secondary | ICD-10-CM

## 2019-11-03 DIAGNOSIS — E785 Hyperlipidemia, unspecified: Secondary | ICD-10-CM | POA: Diagnosis not present

## 2019-11-03 DIAGNOSIS — I10 Essential (primary) hypertension: Secondary | ICD-10-CM

## 2019-11-03 MED ORDER — ROSUVASTATIN CALCIUM 5 MG PO TABS: 5.0000 mg | ORAL_TABLET | Freq: Every day | ORAL | 3 refills | Status: DC

## 2019-11-03 NOTE — Patient Instructions (Signed)
Start Crestor 5 mg daily  Eat heart healthy diet   Heart-Healthy Eating Plan Many factors influence your heart (coronary) health, including eating and exercise habits. Coronary risk increases with abnormal blood fat (lipid) levels. Heart-healthy meal planning includes limiting unhealthy fats, increasing healthy fats, and making other diet and lifestyle changes. What is my plan? Your health care provider may recommend that you:  Limit your fat intake to _________% or less of your total calories each day.  Limit your saturated fat intake to _________% or less of your total calories each day.  Limit the amount of cholesterol in your diet to less than _________ mg per day. What are tips for following this plan? Cooking Cook foods using methods other than frying. Baking, boiling, grilling, and broiling are all good options. Other ways to reduce fat include:  Removing the skin from poultry.  Removing all visible fats from meats.  Steaming vegetables in water or broth. Meal planning   At meals, imagine dividing your plate into fourths: ? Fill one-half of your plate with vegetables and green salads. ? Fill one-fourth of your plate with whole grains. ? Fill one-fourth of your plate with lean protein foods.  Eat 4-5 servings of vegetables per day. One serving equals 1 cup raw or cooked vegetable, or 2 cups raw leafy greens.  Eat 4-5 servings of fruit per day. One serving equals 1 medium whole fruit,  cup dried fruit,  cup fresh, frozen, or canned fruit, or  cup 100% fruit juice.  Eat more foods that contain soluble fiber. Examples include apples, broccoli, carrots, beans, peas, and barley. Aim to get 25-30 g of fiber per day.  Increase your consumption of legumes, nuts, and seeds to 4-5 servings per week. One serving of dried beans or legumes equals  cup cooked, 1 serving of nuts is  cup, and 1 serving of seeds equals 1 tablespoon. Fats  Choose healthy fats more often. Choose  monounsaturated and polyunsaturated fats, such as olive and canola oils, flaxseeds, walnuts, almonds, and seeds.  Eat more omega-3 fats. Choose salmon, mackerel, sardines, tuna, flaxseed oil, and ground flaxseeds. Aim to eat fish at least 2 times each week.  Check food labels carefully to identify foods with trans fats or high amounts of saturated fat.  Limit saturated fats. These are found in animal products, such as meats, butter, and cream. Plant sources of saturated fats include palm oil, palm kernel oil, and coconut oil.  Avoid foods with partially hydrogenated oils in them. These contain trans fats. Examples are stick margarine, some tub margarines, cookies, crackers, and other baked goods.  Avoid fried foods. General information  Eat more home-cooked food and less restaurant, buffet, and fast food.  Limit or avoid alcohol.  Limit foods that are high in starch and sugar.  Lose weight if you are overweight. Losing just 5-10% of your body weight can help your overall health and prevent diseases such as diabetes and heart disease.  Monitor your salt (sodium) intake, especially if you have high blood pressure. Talk with your health care provider about your sodium intake.  Try to incorporate more vegetarian meals weekly. What foods can I eat? Fruits All fresh, canned (in natural juice), or frozen fruits. Vegetables Fresh or frozen vegetables (raw, steamed, roasted, or grilled). Green salads. Grains Most grains. Choose whole wheat and whole grains most of the time. Rice and pasta, including brown rice and pastas made with whole wheat. Meats and other proteins Lean, well-trimmed beef, veal, pork,  and lamb. Chicken and [place] without skin. All fish and shellfish. Wild duck, rabbit, pheasant, and venison. Egg whites or low-cholesterol egg substitutes. Dried beans, peas, lentils, and tofu. Seeds and most nuts. Dairy Low-fat or nonfat cheeses, including ricotta and mozzarella. Skim or 1%  milk (liquid, powdered, or evaporated). Buttermilk made with low-fat milk. Nonfat or low-fat yogurt. Fats and oils Non-hydrogenated (trans-free) margarines. Vegetable oils, including soybean, sesame, sunflower, olive, peanut, safflower, corn, canola, and cottonseed. Salad dressings or mayonnaise made with a vegetable oil. Beverages Water (mineral or sparkling). Coffee and tea. Diet carbonated beverages. Sweets and desserts Sherbet, gelatin, and fruit ice. Small amounts of dark chocolate. Limit all sweets and desserts. Seasonings and condiments All seasonings and condiments. The items listed above may not be a complete list of foods and beverages you can eat. Contact a dietitian for more options. What foods are not recommended? Fruits Canned fruit in heavy syrup. Fruit in cream or butter sauce. Fried fruit. Limit coconut. Vegetables Vegetables cooked in cheese, cream, or butter sauce. Fried vegetables. Grains Breads made with saturated or trans fats, oils, or whole milk. Croissants. Sweet rolls. Donuts. High-fat crackers, such as cheese crackers. Meats and other proteins Fatty meats, such as hot dogs, ribs, sausage, bacon, rib-eye roast or steak. High-fat deli meats, such as salami and bologna. Caviar. Domestic duck and goose. Organ meats, such as liver. Dairy Cream, sour cream, cream cheese, and creamed cottage cheese. Whole milk cheeses. Whole or 2% milk (liquid, evaporated, or condensed). Whole buttermilk. Cream sauce or high-fat cheese sauce. Whole-milk yogurt. Fats and oils Meat fat, or shortening. Cocoa butter, hydrogenated oils, palm oil, coconut oil, palm kernel oil. Solid fats and shortenings, including bacon fat, salt pork, lard, and butter. Nondairy cream substitutes. Salad dressings with cheese or sour cream. Beverages Regular sodas and any drinks with added sugar. Sweets and desserts Frosting. Pudding. Cookies. Cakes. Pies. Milk chocolate or white chocolate. Buttered syrups.  Full-fat ice cream or ice cream drinks. The items listed above may not be a complete list of foods and beverages to avoid. Contact a dietitian for more information. Summary  Heart-healthy meal planning includes limiting unhealthy fats, increasing healthy fats, and making other diet and lifestyle changes.  Lose weight if you are overweight. Losing just 5-10% of your body weight can help your overall health and prevent diseases such as diabetes and heart disease.  Focus on eating a balance of foods, including fruits and vegetables, low-fat or nonfat dairy, lean protein, nuts and legumes, whole grains, and heart-healthy oils and fats. This information is not intended to replace advice given to you by your health care provider. Make sure you discuss any questions you have with your health care provider. Document Revised: 09/21/2017 Document Reviewed: 09/21/2017 Elsevier Patient Education  2020 [employer].

## 2019-11-13 ENCOUNTER — Telehealth: Payer: Self-pay | Admitting: Nurse Practitioner

## 2019-11-13 NOTE — Telephone Encounter (Signed)
Patient advised she must wait 14 days after Covid vaccine to get Shingrix vaccine.  Also advised she must get second Shingrix vaccine between 2-6 months after the first one.  Patient had first Shingrix vaccine in November so she decided she wanted to wait until after she has gallbladder surgery in April to have her second Shingrix vaccine.

## 2019-11-18 ENCOUNTER — Telehealth: Payer: Self-pay | Admitting: Nurse Practitioner

## 2019-11-18 ENCOUNTER — Ambulatory Visit (INDEPENDENT_AMBULATORY_CARE_PROVIDER_SITE_OTHER): Payer: Medicare Other | Admitting: *Deleted

## 2019-11-18 ENCOUNTER — Other Ambulatory Visit: Payer: Self-pay

## 2019-11-18 DIAGNOSIS — Z23 Encounter for immunization: Secondary | ICD-10-CM

## 2019-11-18 NOTE — Progress Notes (Signed)
Shingrix given IM left deltoid and tolerated well.

## 2019-11-18 NOTE — Patient Instructions (Signed)
Zoster Vaccine, Recombinant injection What is this medicine? ZOSTER VACCINE (ZOS ter vak SEEN) is used to prevent shingles in adults 84 years old and over. This vaccine is not used to treat shingles or nerve pain from shingles. This medicine may be used for other purposes; ask your health care provider or pharmacist if you have questions. COMMON BRAND NAME(S): SHINGRIX What should I tell my health care provider before I take this medicine? They need to know if you have any of these conditions:  blood disorders or disease  cancer like leukemia or lymphoma  immune system problems or therapy  an unusual or allergic reaction to vaccines, other medications, foods, dyes, or preservatives  pregnant or trying to get pregnant  breast-feeding How should I use this medicine? This vaccine is for injection in a muscle. It is given by a health care professional. Talk to your pediatrician regarding the use of this medicine in children. This medicine is not approved for use in children. Overdosage: If you think you have taken too much of this medicine contact a poison control center or emergency room at once. NOTE: This medicine is only for you. Do not share this medicine with others. What if I miss a dose? Keep appointments for follow-up (booster) doses as directed. It is important not to miss your dose. Call your doctor or health care professional if you are unable to keep an appointment. What may interact with this medicine?  medicines that suppress your immune system  medicines to treat cancer  steroid medicines like prednisone or cortisone This list may not describe all possible interactions. Give your health care provider a list of all the medicines, herbs, non-prescription drugs, or dietary supplements you use. Also tell them if you smoke, drink alcohol, or use illegal drugs. Some items may interact with your medicine. What should I watch for while using this medicine? Visit your doctor for  regular check ups. This vaccine, like all vaccines, may not fully protect everyone. What side effects may I notice from receiving this medicine? Side effects that you should report to your doctor or health care professional as soon as possible:  allergic reactions like skin rash, itching or hives, swelling of the face, lips, or tongue  breathing problems Side effects that usually do not require medical attention (report these to your doctor or health care professional if they continue or are bothersome):  chills  headache  fever  nausea, vomiting  redness, warmth, pain, swelling or itching at site where injected  tiredness This list may not describe all possible side effects. Call your doctor for medical advice about side effects. You may report side effects to FDA at 1-800-FDA-1088. Where should I keep my medicine? This vaccine is only given in a clinic, pharmacy, doctor's office, or other health care setting and will not be stored at home. NOTE: This sheet is a summary. It may not cover all possible information. If you have questions about this medicine, talk to your doctor, pharmacist, or health care provider.  2020 Elsevier/Gold Standard (2017-03-26 13:20:30)  

## 2019-11-18 NOTE — Telephone Encounter (Signed)
Pt has surgery 4/20 and wanted to make sure getting the Shingrix vaccine wouldn't cause any issues with that. I advised pt she is fine to get the Shingrix vaccine today.

## 2019-12-11 NOTE — Pre-Procedure Instructions (Addendum)
[facility], [address] [place] [address] Phone: [phone] Fax: [phone]      Your procedure is scheduled on Tuesday April 20th.  Report to [facility] "[facility]" at 1:00 P.M., and check in at the Admitting office.  Call this number if you have problems the morning of surgery:  [phone]  Call [phone] if you have any questions prior to your surgery date Monday-Friday 8am-4pm    Remember:  Do not eat after midnight the night before your surgery  You may drink clear liquids until 12:00 PM the day of your surgery.   Clear liquids allowed are: Water, Non-Citrus Juices (without pulp), Carbonated Beverages, Clear Tea, Black Coffee Only, and Gatorade   Enhanced Recovery after Surgery for Orthopedics Enhanced Recovery after Surgery is a protocol used to improve the stress on your body and your recovery after surgery.  Patient Instructions  . The night before surgery:  o No food after midnight. ONLY clear liquids after midnight         If you have questions, please contact your surgeon's office.   Take these medicines the morning of surgery with A SIP OF WATER   pantoprazole (PROTONIX)   rosuvastatin (CRESTOR)  fluticasone (FLONASE) as needed  Brimonidine Tartrate (LUMIFY) as needed  diazepam (VALIUM) as needed   ROLAIDS as needed    As of today, STOP taking any Aspirin (unless otherwise instructed by your surgeon) and Aspirin containing products, Aleve, Naproxen, Ibuprofen, Motrin, Advil, Goody's, BC's, all herbal medications, fish oil, and all vitamins.                      Do not wear jewelry, make up, or nail polish            Do not wear lotions, powders, perfumes/colognes, or deodorant.            Do not shave 48 hours prior to surgery.  Men may shave face and neck.            Do not bring valuables to the hospital.            [facility] is not responsible for any belongings or valuables.  Do NOT  Smoke (Tobacco/Vapping) or drink Alcohol 24 hours prior to your procedure If you use a CPAP at night, you may bring all equipment for your overnight stay.   Contacts, glasses, dentures or bridgework may not be worn into surgery.      For patients admitted to the hospital, discharge time will be determined by your treatment team.   Patients discharged the day of surgery will not be allowed to drive home, and someone needs to stay with them for 24 hours.    Special instructions:   [facility]- Preparing For Surgery  Before surgery, you can play an important role. Because skin is not sterile, your skin needs to be as free of germs as possible. You can reduce the number of germs on your skin by washing with CHG (chlorahexidine gluconate) Soap before surgery.  CHG is an antiseptic cleaner which kills germs and bonds with the skin to continue killing germs even after washing.    Oral Hygiene is also important to reduce your risk of infection.  Remember - BRUSH YOUR TEETH THE MORNING OF SURGERY WITH YOUR REGULAR TOOTHPASTE  Please do not use if you have an allergy to CHG or antibacterial soaps. If your skin  becomes reddened/irritated stop using the CHG.  Do not shave (including legs and underarms) for at least 48 hours prior to first CHG shower. It is OK to shave your face.  Please follow these instructions carefully.   1. Shower the NIGHT BEFORE SURGERY and the MORNING OF SURGERY with CHG Soap.   2. If you chose to wash your hair, wash your hair first as usual with your normal shampoo.  3. After you shampoo, rinse your hair and body thoroughly to remove the shampoo.  4. Use CHG as you would any other liquid soap. You can apply CHG directly to the skin and wash gently with a scrungie or a clean washcloth.   5. Apply the CHG Soap to your body ONLY FROM THE NECK DOWN.  Do not use on open wounds or open sores. Avoid contact with your eyes, ears, mouth and genitals (private parts). Wash Face and  genitals (private parts)  with your normal soap.   6. Wash thoroughly, paying special attention to the area where your surgery will be performed.  7. Thoroughly rinse your body with warm water from the neck down.  8. DO NOT shower/wash with your normal soap after using and rinsing off the CHG Soap.  9. Pat yourself dry with a CLEAN TOWEL.  10. Wear CLEAN PAJAMAS to bed the night before surgery, wear comfortable clothes the morning of surgery  11. Place CLEAN SHEETS on your bed the night of your first shower and DO NOT SLEEP WITH PETS.   Day of Surgery:   Do not apply any deodorants/lotions.  Please wear clean clothes to the hospital/surgery center.   Remember to brush your teeth WITH YOUR REGULAR TOOTHPASTE.   Please read over the following fact sheets that you were given.

## 2019-12-12 ENCOUNTER — Other Ambulatory Visit: Payer: Self-pay

## 2019-12-12 ENCOUNTER — Other Ambulatory Visit (HOSPITAL_COMMUNITY)
Admission: RE | Admit: 2019-12-12 | Discharge: 2019-12-12 | Disposition: A | Discharge status: Home / Self Care | Payer: Medicare Other | Source: Ambulatory Visit | Attending: Surgery | Admitting: Surgery

## 2019-12-12 ENCOUNTER — Encounter (HOSPITAL_COMMUNITY)
Admission: RE | Admit: 2019-12-12 | Discharge: 2019-12-12 | Disposition: A | Discharge status: Home / Self Care | Payer: Medicare Other | Source: Ambulatory Visit | Attending: Surgery | Admitting: Surgery

## 2019-12-12 ENCOUNTER — Encounter (HOSPITAL_COMMUNITY): Payer: Self-pay

## 2019-12-12 DIAGNOSIS — Z01818 Encounter for other preprocedural examination: Secondary | ICD-10-CM | POA: Insufficient documentation

## 2019-12-12 DIAGNOSIS — K219 Gastro-esophageal reflux disease without esophagitis: Secondary | ICD-10-CM | POA: Diagnosis not present

## 2019-12-12 DIAGNOSIS — K8020 Calculus of gallbladder without cholecystitis without obstruction: Secondary | ICD-10-CM | POA: Insufficient documentation

## 2019-12-12 DIAGNOSIS — Z20822 Contact with and (suspected) exposure to covid-19: Secondary | ICD-10-CM | POA: Insufficient documentation

## 2019-12-12 DIAGNOSIS — Z8249 Family history of ischemic heart disease and other diseases of the circulatory system: Secondary | ICD-10-CM | POA: Diagnosis not present

## 2019-12-12 DIAGNOSIS — Z7902 Long term (current) use of antithrombotics/antiplatelets: Secondary | ICD-10-CM | POA: Insufficient documentation

## 2019-12-12 DIAGNOSIS — Z79899 Other long term (current) drug therapy: Secondary | ICD-10-CM | POA: Diagnosis not present

## 2019-12-12 DIAGNOSIS — Z87891 Personal history of nicotine dependence: Secondary | ICD-10-CM | POA: Diagnosis not present

## 2019-12-12 DIAGNOSIS — Z8673 Personal history of transient ischemic attack (TIA), and cerebral infarction without residual deficits: Secondary | ICD-10-CM | POA: Insufficient documentation

## 2019-12-12 DIAGNOSIS — E785 Hyperlipidemia, unspecified: Secondary | ICD-10-CM | POA: Diagnosis not present

## 2019-12-12 DIAGNOSIS — F419 Anxiety disorder, unspecified: Secondary | ICD-10-CM | POA: Insufficient documentation

## 2019-12-12 DIAGNOSIS — Z7982 Long term (current) use of aspirin: Secondary | ICD-10-CM | POA: Insufficient documentation

## 2019-12-12 LAB — CBC WITH DIFFERENTIAL/PLATELET
Abs Immature Granulocytes: 0.01 10*3/uL (ref 0.00–0.07)
Basophils Absolute: 0.1 10*3/uL (ref 0.0–0.1)
Basophils Relative: 1 %
Eosinophils Absolute: 0.1 10*3/uL (ref 0.0–0.5)
Eosinophils Relative: 2 %
HCT: 43.4 % (ref 36.0–46.0)
Hemoglobin: 14 g/dL (ref 12.0–15.0)
Immature Granulocytes: 0 %
Lymphocytes Relative: 45 %
Lymphs Abs: 2.3 10*3/uL (ref 0.7–4.0)
MCH: 30.6 pg (ref 26.0–34.0)
MCHC: 32.3 g/dL (ref 30.0–36.0)
MCV: 95 fL (ref 80.0–100.0)
Monocytes Absolute: 0.5 10*3/uL (ref 0.1–1.0)
Monocytes Relative: 10 %
Neutro Abs: 2.1 10*3/uL (ref 1.7–7.7)
Neutrophils Relative %: 42 %
Platelets: 287 10*3/uL (ref 150–400)
RBC: 4.57 MIL/uL (ref 3.87–5.11)
RDW: 12.2 % (ref 11.5–15.5)
WBC: 5 10*3/uL (ref 4.0–10.5)
nRBC: 0 % (ref 0.0–0.2)

## 2019-12-12 LAB — COMPREHENSIVE METABOLIC PANEL
ALT: 17 U/L (ref 0–44)
AST: 24 U/L (ref 15–41)
Albumin: 3.9 g/dL (ref 3.5–5.0)
Alkaline Phosphatase: 63 U/L (ref 38–126)
Anion gap: 10 (ref 5–15)
BUN: 10 mg/dL (ref 8–23)
CO2: 26 mmol/L (ref 22–32)
Calcium: 9.5 mg/dL (ref 8.9–10.3)
Chloride: 106 mmol/L (ref 98–111)
Creatinine, Ser: 0.68 mg/dL (ref 0.44–1.00)
GFR calc Af Amer: >60 mL/min (ref >60)
GFR calc non Af Amer: >60 mL/min (ref >60)
Glucose, Bld: 103 mg/dL — ABNORMAL HIGH (ref 70–99)
Potassium: 3.8 mmol/L (ref 3.5–5.1)
Sodium: 142 mmol/L (ref 135–145)
Total Bilirubin: 0.5 mg/dL (ref 0.3–1.2)
Total Protein: 6.5 g/dL (ref 6.5–8.1)

## 2019-12-12 LAB — SARS CORONAVIRUS 2 (TAT 6-24 HRS): SARS Coronavirus 2: NEGATIVE

## 2019-12-12 NOTE — Progress Notes (Signed)
PCP - Dr. [name] Cardiologist - Dr. Martinique  PPM/ICD - Denies-   Chest x-ray - 04/09/19 EKG - 04/10/20 Stress Test - 05/15/19 ECHO - 10/18/18 Cardiac Cath - Denies  Sleep Study - Denies  DM - Denies  Blood Thinner Instructions: Instructed by surgeon to stop Plavix 5 days prior to surgery. Per patient last dose of Plavix was on 12/11/19. Aspirin Instructions:Instructed to hold starting today  ERAS Protcol -Yes  COVID TEST- 12/12/19  Anesthesia review: Yes Cardiac hx (Abd EKG 04/11/19)  Patient denies shortness of breath, fever, cough and chest pain at PAT appointment  All instructions explained to the patient, with a verbal understanding of the material. Patient agrees to go over the instructions while at home for a better understanding. Patient also instructed to self quarantine after being tested for COVID-19. The opportunity to ask questions was provided.

## 2019-12-15 NOTE — Progress Notes (Signed)
Anesthesia Chart Review:  Case: [id number] Date/Time: 12/16/19 1445   Procedure: LAPAROSCOPIC CHOLECYSTECTOMY WITH INTRAOPERATIVE CHOLANGIOGRAM (N/A )   Anesthesia type: General   Pre-op diagnosis: gallstones   Location: [facility] OR ROOM 09 / [facility]   Surgeons: Erroll Luna, MD      DISCUSSION: Patient is an 86 year old female scheduled for the above procedure.  History includes former smoker (quit 1994), HTN, HLD, GERD, anxiety, CVA (09/2018).  Recent evaluation by Dr. Martinique for chest pain follow-up with non-ischemic stress test 04/2019. According to 11/03/19 note, "Chest pain:  Due to biliary colic. Risk factors for CAD include HTN, HLD, and family history of CAD. Myoview study in September 2020 was normal. She is cleared for surgery from a cardiac standpoint. My hold Plavix 5 days prior to surgery." Last Plavix 12/11/19. Last ASA 12/12/19.   Presurgical COVID-19 test negative on 12/12/2019.  Anesthesia team to evaluate on the day of surgery.   VS: BP 140/64   Pulse 79   Temp 36.8 C (Oral)   Resp 17   Ht 5' 4.5" (1.638 m)   Wt 61 kg   SpO2 97%   BMI 22.73 kg/m    PROVIDERS: [name], FNP is PCP Martinique, Peter, MD is cardiologist  Phillips Odor, MD is neurologist Silvano Rusk, MD is GI   LABS: Labs reviewed: Acceptable for surgery. A1c 5.8% 10/18/18.  (all labs ordered are listed, but only abnormal results are displayed)  Labs Reviewed  COMPREHENSIVE METABOLIC PANEL - Abnormal; Notable for the following components:      Result Value   Glucose, Bld 103 (*)    All other components within normal limits  CBC WITH DIFFERENTIAL/PLATELET     IMAGES: CXR 04/09/19: FINDINGS: Bandlike opacity in the left lung base compatible with subsegmental atelectasis. The lungs are otherwise clear. No consolidation, features of edema, pneumothorax, or effusion. Pulmonary vascularity is normally distributed. The aorta is calcified and tortuous. The cardiomediastinal contours are  otherwise unremarkable. No acute osseous or soft tissue abnormality. Levocurvature of the thoracic spine is similar to prior exams. Degenerative changes are present in the and imaged spine and shoulders. Elevation of the right humeral head may suggest right rotator cuff insufficiency. IMPRESSION: No active cardiopulmonary disease.   EKG: 04/09/19:  Sinus tachycardia at 102 bpm Low voltage QRS Septal infarct , age undetermined Abnormal ECG No STEMI Confirmed by Nanda Quinton [phone]) on 04/09/2019 4:06:28 PM Also confirmed by Nanda Quinton [phone]), editor Philomena Doheny [phone]) on 04/10/2019 8:45:03 AM   CV: Nuclear stress test 05/14/19:  The left ventricular ejection fraction is hyperdynamic (>65%).  Nuclear stress EF: 66%.  No T wave inversion was noted during stress.  There was no ST segment deviation noted during stress.  The study is normal.  This is a low risk study. Normal perfusion. LVEF 66% with normal wall motion. This is a low risk study.  Echo 10/18/18: IMPRESSIONS  1. The left ventricle has normal systolic function with an ejection  fraction of 60-65%. The cavity size was normal. There is basal septal  hypertophy. Left ventricular diastolic Doppler parameters are consistent  with impaired relaxation.  2. The right ventricle has normal systolic function. The cavity was  normal. There is no increase in right ventricular wall thickness.  3. The aortic valve is tricuspid Moderate thickening of the aortic valve  Moderate calcification of the aortic valve. Aortic valve regurgitation is  mild by color flow Doppler.. Moderate aortic annular calcification noted.  4. The mitral valve is  normal in structure. Mild thickening of the mitral  valve leaflet. Mild calcification of the mitral valve leaflet. There is  mild mitral annular calcification present. No evidence of mitral valve  stenosis.  5. The tricuspid valve is normal in structure.  6. The pulmonic valve was  grossly normal. Pulmonic valve regurgitation is  mild by color flow Doppler.  7. The aortic root is normal in size and structure.  8. Pulmonary hypertension is indeterminat,inadequate TR jet.   Carotid US 10/18/18: IMPRESSION: Color duplex indicates minimal heterogeneous and calcified plaque, with no hemodynamically significant stenosis by duplex criteria in the extracranial cerebrovascular circulation.   Past Medical History:  Diagnosis Date  . Anxiety   . Cataract   . GERD (gastroesophageal reflux disease)   . Hyperlipidemia   . Hypertension   . Stroke ([place]) 09/2018  . Vertigo    patient denies    Past Surgical History:  Procedure Laterality Date  . ABDOMINAL HYSTERECTOMY  1980  . CATARACT EXTRACTION Right   . EYE SURGERY    . KNEE ARTHROSCOPY Left   . ROTATOR CUFF REPAIR Left   . SQUAMOUS CELL CARCINOMA EXCISION Left   . TONSILLECTOMY      MEDICATIONS: . alum & mag hydroxide-simeth (MYLANTA) 200-200-20 MG/5ML suspension  . amLODipine (NORVASC) 2.5 MG tablet  . aspirin EC 81 MG tablet  . Brimonidine Tartrate (LUMIFY) 0.025 % SOLN  . Cholecalciferol (VITAMIN D3) 50 MCG (2000 UT) capsule  . clopidogrel (PLAVIX) 75 MG tablet  . diazepam (VALIUM) 2 MG tablet  . fluticasone (FLONASE) 50 MCG/ACT nasal spray  . losartan (COZAAR) 100 MG tablet  . Multiple Vitamin (MULTIVITAMIN WITH MINERALS) TABS tablet  . pantoprazole (PROTONIX) 40 MG tablet  . ROLAIDS 550-110 MG CHEW  . rosuvastatin (CRESTOR) 5 MG tablet   No current facility-administered medications for this encounter.    Myra Gianotti, PA-C Surgical Short Stay/Anesthesiology [facility] Phone [phone] [facility] Phone [phone] 12/15/2019 1:06 PM

## 2019-12-15 NOTE — Anesthesia Preprocedure Evaluation (Addendum)
Anesthesia Evaluation  Patient identified by MRN, date of birth, ID band Patient awake    Reviewed: Allergy & Precautions, NPO status , Patient's Chart, lab work & pertinent test results  History of Anesthesia Complications Negative for: history of anesthetic complications  Airway Mallampati: II  TM Distance: >3 FB Neck ROM: Full    Dental  (+) Edentulous Lower, Lower Dentures, Upper Dentures, Edentulous Upper   Pulmonary neg recent URI, former smoker,    breath sounds clear to auscultation       Cardiovascular hypertension, Pt. on medications (-) angina(-) Past MI and (-) CHF  Rhythm:Regular  EKG: 04/09/19:  Sinus tachycardia at 102 bpm Low voltage QRS Septal infarct , age undetermined Abnormal ECG No STEMI Confirmed by Nanda Quinton [phone]) on 04/09/2019 4:06:28 PM Also confirmed by Nanda Quinton [phone]), editor Philomena Doheny [phone]) on 04/10/2019 8:45:03 AM   CV: Nuclear stress test 05/14/19:  The left ventricular ejection fraction is hyperdynamic (>65%).  Nuclear stress EF: 66%.  No T wave inversion was noted during stress.  There was no ST segment deviation noted during stress.  The study is normal.  This is a low risk study. Normal perfusion. LVEF 66% with normal wall motion. This is a low risk study.  Echo 10/18/18: IMPRESSIONS  1. The left ventricle has normal systolic function with an ejection  fraction of 60-65%. The cavity size was normal. There is basal septal  hypertophy. Left ventricular diastolic Doppler parameters are consistent  with impaired relaxation.  2. The right ventricle has normal systolic function. The cavity was  normal. There is no increase in right ventricular wall thickness.  3. The aortic valve is tricuspid Moderate thickening of the aortic valve  Moderate calcification of the aortic valve. Aortic valve regurgitation is  mild by color flow Doppler.. Moderate aortic annular  calcification noted.  4. The mitral valve is normal in structure. Mild thickening of the mitral  valve leaflet. Mild calcification of the mitral valve leaflet. There is  mild mitral annular calcification present. No evidence of mitral valve  stenosis.  5. The tricuspid valve is normal in structure.  6. The pulmonic valve was grossly normal. Pulmonic valve regurgitation is  mild by color flow Doppler.  7. The aortic root is normal in size and structure.  8. Pulmonary hypertension is indeterminat,inadequate TR jet.    Neuro/Psych PSYCHIATRIC DISORDERS Anxiety CVA x 1 09/2018 CVA    GI/Hepatic Neg liver ROS, GERD  Medicated and Controlled,gallstones   Endo/Other  negative endocrine ROS  Renal/GU negative Renal ROS     Musculoskeletal  (+) Arthritis ,   Abdominal   Peds  Hematology negative hematology ROS (+)   Anesthesia Other Findings   Reproductive/Obstetrics                             Anesthesia Physical Anesthesia Plan  ASA: II  Anesthesia Plan: General   Post-op Pain Management:    Induction: Intravenous  PONV Risk Score and Plan: 3 and Ondansetron and Dexamethasone  Airway Management Planned: Oral ETT  Additional Equipment: None  Intra-op Plan:   Post-operative Plan: Extubation in OR  Informed Consent: I have reviewed the patients History and Physical, chart, labs and discussed the procedure including the risks, benefits and alternatives for the proposed anesthesia with the patient or authorized representative who has indicated his/her understanding and acceptance.     Dental advisory given  Plan Discussed with: CRNA and Surgeon  Anesthesia Plan Comments: (PAT note written 12/15/2019 by Myra Gianotti, PA-C. )       Anesthesia Quick Evaluation

## 2019-12-16 ENCOUNTER — Ambulatory Visit (HOSPITAL_COMMUNITY): Payer: Medicare Other | Admitting: Anesthesiology

## 2019-12-16 ENCOUNTER — Encounter (HOSPITAL_COMMUNITY)
Admission: RE | Disposition: A | Discharge status: Home / Self Care | Payer: Self-pay | Source: Home / Self Care | Attending: Surgery

## 2019-12-16 ENCOUNTER — Ambulatory Visit (HOSPITAL_COMMUNITY)
Admission: RE | Admit: 2019-12-16 | Discharge: 2019-12-16 | Disposition: A | Discharge status: Home / Self Care | Payer: Medicare Other | Attending: Surgery | Admitting: Surgery

## 2019-12-16 ENCOUNTER — Ambulatory Visit (HOSPITAL_COMMUNITY): Payer: Medicare Other

## 2019-12-16 ENCOUNTER — Ambulatory Visit (HOSPITAL_COMMUNITY): Payer: Medicare Other | Admitting: Vascular Surgery

## 2019-12-16 ENCOUNTER — Other Ambulatory Visit: Payer: Self-pay

## 2019-12-16 ENCOUNTER — Encounter (HOSPITAL_COMMUNITY): Payer: Self-pay | Admitting: Surgery

## 2019-12-16 DIAGNOSIS — F419 Anxiety disorder, unspecified: Secondary | ICD-10-CM | POA: Insufficient documentation

## 2019-12-16 DIAGNOSIS — K8010 Calculus of gallbladder with chronic cholecystitis without obstruction: Secondary | ICD-10-CM | POA: Insufficient documentation

## 2019-12-16 DIAGNOSIS — M1990 Unspecified osteoarthritis, unspecified site: Secondary | ICD-10-CM | POA: Diagnosis not present

## 2019-12-16 DIAGNOSIS — E7800 Pure hypercholesterolemia, unspecified: Secondary | ICD-10-CM | POA: Insufficient documentation

## 2019-12-16 DIAGNOSIS — Z8673 Personal history of transient ischemic attack (TIA), and cerebral infarction without residual deficits: Secondary | ICD-10-CM | POA: Diagnosis not present

## 2019-12-16 DIAGNOSIS — K915 Postcholecystectomy syndrome: Secondary | ICD-10-CM | POA: Diagnosis not present

## 2019-12-16 DIAGNOSIS — I10 Essential (primary) hypertension: Secondary | ICD-10-CM | POA: Diagnosis not present

## 2019-12-16 DIAGNOSIS — E785 Hyperlipidemia, unspecified: Secondary | ICD-10-CM | POA: Diagnosis not present

## 2019-12-16 DIAGNOSIS — Z87891 Personal history of nicotine dependence: Secondary | ICD-10-CM | POA: Insufficient documentation

## 2019-12-16 DIAGNOSIS — Z419 Encounter for procedure for purposes other than remedying health state, unspecified: Secondary | ICD-10-CM

## 2019-12-16 DIAGNOSIS — R030 Elevated blood-pressure reading, without diagnosis of hypertension: Secondary | ICD-10-CM | POA: Insufficient documentation

## 2019-12-16 DIAGNOSIS — K219 Gastro-esophageal reflux disease without esophagitis: Secondary | ICD-10-CM | POA: Insufficient documentation

## 2019-12-16 DIAGNOSIS — K8020 Calculus of gallbladder without cholecystitis without obstruction: Secondary | ICD-10-CM | POA: Diagnosis not present

## 2019-12-16 HISTORY — PX: CHOLECYSTECTOMY: SHX55

## 2019-12-16 SURGERY — LAPAROSCOPIC CHOLECYSTECTOMY WITH INTRAOPERATIVE CHOLANGIOGRAM
Anesthesia: General | Site: Abdomen

## 2019-12-16 MED ORDER — BUPIVACAINE HCL (PF) 0.25 % IJ SOLN
INTRAMUSCULAR | Status: AC
  Filled 2019-12-16: qty 30

## 2019-12-16 MED ORDER — OXYCODONE HCL 5 MG PO TABS: 5.0000 mg | ORAL_TABLET | Freq: Once | ORAL | Status: DC | PRN

## 2019-12-16 MED ORDER — SODIUM CHLORIDE 0.9 % IV SOLN
INTRAVENOUS | Status: DC | PRN
  Administered 2019-12-16: 20 mL

## 2019-12-16 MED ORDER — ACETAMINOPHEN 500 MG PO TABS
1000.0000 mg | ORAL_TABLET | ORAL | Status: AC
  Administered 2019-12-16: 1000 mg via ORAL

## 2019-12-16 MED ORDER — HYDROCODONE-ACETAMINOPHEN 5-325 MG PO TABS: 1.0000 | ORAL_TABLET | Freq: Four times a day (QID) | ORAL | 0 refills | Status: DC | PRN

## 2019-12-16 MED ORDER — FENTANYL CITRATE (PF) 250 MCG/5ML IJ SOLN
INTRAMUSCULAR | Status: AC
  Filled 2019-12-16: qty 5

## 2019-12-16 MED ORDER — CEFAZOLIN SODIUM-DEXTROSE 2-4 GM/100ML-% IV SOLN
INTRAVENOUS | Status: AC
  Filled 2019-12-16: qty 100

## 2019-12-16 MED ORDER — CHLORHEXIDINE GLUCONATE CLOTH 2 % EX PADS: 6.0000 | MEDICATED_PAD | Freq: Once | CUTANEOUS | Status: DC

## 2019-12-16 MED ORDER — GLYCOPYRROLATE PF 0.2 MG/ML IJ SOSY
PREFILLED_SYRINGE | INTRAMUSCULAR | Status: AC
  Filled 2019-12-16: qty 1

## 2019-12-16 MED ORDER — ONDANSETRON HCL 4 MG/2ML IJ SOLN
INTRAMUSCULAR | Status: AC
  Filled 2019-12-16: qty 2

## 2019-12-16 MED ORDER — PROPOFOL 10 MG/ML IV BOLUS
INTRAVENOUS | Status: DC | PRN
  Administered 2019-12-16: 50 mg via INTRAVENOUS

## 2019-12-16 MED ORDER — 0.9 % SODIUM CHLORIDE (POUR BTL) OPTIME
TOPICAL | Status: DC | PRN
  Administered 2019-12-16: 1000 mL

## 2019-12-16 MED ORDER — ROCURONIUM BROMIDE 10 MG/ML (PF) SYRINGE
PREFILLED_SYRINGE | INTRAVENOUS | Status: AC
  Filled 2019-12-16: qty 10

## 2019-12-16 MED ORDER — EPHEDRINE SULFATE-NACL 50-0.9 MG/10ML-% IV SOSY
PREFILLED_SYRINGE | INTRAVENOUS | Status: DC | PRN
  Administered 2019-12-16 (×2): 10 mg via INTRAVENOUS
  Administered 2019-12-16: 15 mg via INTRAVENOUS

## 2019-12-16 MED ORDER — LIDOCAINE 2% (20 MG/ML) 5 ML SYRINGE
INTRAMUSCULAR | Status: AC
  Filled 2019-12-16: qty 5

## 2019-12-16 MED ORDER — PROPOFOL 10 MG/ML IV BOLUS
INTRAVENOUS | Status: AC
  Filled 2019-12-16: qty 20

## 2019-12-16 MED ORDER — ACETAMINOPHEN 160 MG/5ML PO SOLN: 1000.0000 mg | Freq: Once | ORAL | Status: DC | PRN

## 2019-12-16 MED ORDER — EPHEDRINE 5 MG/ML INJ
INTRAVENOUS | Status: AC
  Filled 2019-12-16: qty 20

## 2019-12-16 MED ORDER — ACETAMINOPHEN 500 MG PO TABS: 1000.0000 mg | ORAL_TABLET | Freq: Once | ORAL | Status: DC | PRN

## 2019-12-16 MED ORDER — FENTANYL CITRATE (PF) 100 MCG/2ML IJ SOLN: 25.0000 ug | INTRAMUSCULAR | Status: DC | PRN

## 2019-12-16 MED ORDER — ACETAMINOPHEN 10 MG/ML IV SOLN: 1000.0000 mg | Freq: Once | INTRAVENOUS | Status: DC | PRN

## 2019-12-16 MED ORDER — ROCURONIUM BROMIDE 10 MG/ML (PF) SYRINGE
PREFILLED_SYRINGE | INTRAVENOUS | Status: DC | PRN
  Administered 2019-12-16: 40 mg via INTRAVENOUS

## 2019-12-16 MED ORDER — ARTIFICIAL TEARS OPHTHALMIC OINT
TOPICAL_OINTMENT | OPHTHALMIC | Status: AC
  Filled 2019-12-16: qty 3.5

## 2019-12-16 MED ORDER — CEFAZOLIN SODIUM-DEXTROSE 2-4 GM/100ML-% IV SOLN
2.0000 g | INTRAVENOUS | Status: AC
  Administered 2019-12-16: 2 g via INTRAVENOUS

## 2019-12-16 MED ORDER — LACTATED RINGERS IV SOLN: INTRAVENOUS | Status: DC

## 2019-12-16 MED ORDER — SUGAMMADEX SODIUM 200 MG/2ML IV SOLN
INTRAVENOUS | Status: DC | PRN
  Administered 2019-12-16: 120 mg via INTRAVENOUS

## 2019-12-16 MED ORDER — ONDANSETRON HCL 4 MG/2ML IJ SOLN
INTRAMUSCULAR | Status: DC | PRN
  Administered 2019-12-16: 4 mg via INTRAVENOUS

## 2019-12-16 MED ORDER — LIDOCAINE 2% (20 MG/ML) 5 ML SYRINGE
INTRAMUSCULAR | Status: DC | PRN
  Administered 2019-12-16: 60 mg via INTRAVENOUS

## 2019-12-16 MED ORDER — DEXAMETHASONE SODIUM PHOSPHATE 10 MG/ML IJ SOLN
INTRAMUSCULAR | Status: DC | PRN
  Administered 2019-12-16: 5 mg via INTRAVENOUS

## 2019-12-16 MED ORDER — OXYCODONE HCL 5 MG/5ML PO SOLN: 5.0000 mg | Freq: Once | ORAL | Status: DC | PRN

## 2019-12-16 MED ORDER — DEXAMETHASONE SODIUM PHOSPHATE 10 MG/ML IJ SOLN
INTRAMUSCULAR | Status: AC
  Filled 2019-12-16: qty 1

## 2019-12-16 MED ORDER — FENTANYL CITRATE (PF) 100 MCG/2ML IJ SOLN
INTRAMUSCULAR | Status: DC | PRN
  Administered 2019-12-16 (×2): 50 ug via INTRAVENOUS

## 2019-12-16 MED ORDER — BUPIVACAINE HCL (PF) 0.25 % IJ SOLN
INTRAMUSCULAR | Status: DC | PRN
  Administered 2019-12-16: 8 mL

## 2019-12-16 MED ORDER — EPHEDRINE 5 MG/ML INJ
INTRAVENOUS | Status: AC
  Filled 2019-12-16: qty 10

## 2019-12-16 MED ORDER — SODIUM CHLORIDE 0.9 % IR SOLN
Status: DC | PRN
  Administered 2019-12-16: 1000 mL

## 2019-12-16 SURGICAL SUPPLY — 41 items
APPLIER CLIP ROT 10 11.4 M/L (STAPLE) ×3
BLADE CLIPPER SURG (BLADE) IMPLANT
CANISTER SUCT 3000ML PPV (MISCELLANEOUS) ×3 IMPLANT
CHLORAPREP W/TINT 26 (MISCELLANEOUS) ×3 IMPLANT
CLIP APPLIE ROT 10 11.4 M/L (STAPLE) ×1 IMPLANT
COVER MAYO STAND STRL (DRAPES) ×3 IMPLANT
COVER SURGICAL LIGHT HANDLE (MISCELLANEOUS) ×3 IMPLANT
COVER WAND RF STERILE (DRAPES) IMPLANT
DERMABOND ADVANCED (GAUZE/BANDAGES/DRESSINGS) ×2
DERMABOND ADVANCED .7 DNX12 (GAUZE/BANDAGES/DRESSINGS) ×1 IMPLANT
DRAPE C-ARM 42X120 X-RAY (DRAPES) ×3 IMPLANT
DRAPE WARM FLUID 44X44 (DRAPES) IMPLANT
ELECT REM PT RETURN 9FT ADLT (ELECTROSURGICAL) ×3
ELECTRODE REM PT RTRN 9FT ADLT (ELECTROSURGICAL) ×1 IMPLANT
GLOVE BIO SURGEON STRL SZ8 (GLOVE) ×3 IMPLANT
GLOVE BIOGEL PI IND STRL 8 (GLOVE) ×1 IMPLANT
GLOVE BIOGEL PI INDICATOR 8 (GLOVE) ×2
GOWN STRL REUS W/ TWL LRG LVL3 (GOWN DISPOSABLE) ×4 IMPLANT
GOWN STRL REUS W/ TWL XL LVL3 (GOWN DISPOSABLE) ×1 IMPLANT
GOWN STRL REUS W/TWL LRG LVL3 (GOWN DISPOSABLE) ×12
GOWN STRL REUS W/TWL XL LVL3 (GOWN DISPOSABLE) ×3
KIT BASIN OR (CUSTOM PROCEDURE TRAY) ×3 IMPLANT
KIT TURNOVER KIT B (KITS) ×3 IMPLANT
NS IRRIG 1000ML POUR BTL (IV SOLUTION) ×3 IMPLANT
PAD ARMBOARD 7.5X6 YLW CONV (MISCELLANEOUS) ×3 IMPLANT
POUCH RETRIEVAL ECOSAC 10 (ENDOMECHANICALS) ×1 IMPLANT
POUCH RETRIEVAL ECOSAC 10MM (ENDOMECHANICALS) ×3
SCISSORS LAP 5X35 DISP (ENDOMECHANICALS) ×3 IMPLANT
SET CHOLANGIOGRAPH 5 50 .035 (SET/KITS/TRAYS/PACK) ×3 IMPLANT
SET IRRIG TUBING LAPAROSCOPIC (IRRIGATION / IRRIGATOR) ×3 IMPLANT
SET TUBE SMOKE EVAC HIGH FLOW (TUBING) ×3 IMPLANT
SLEEVE ENDOPATH XCEL 5M (ENDOMECHANICALS) ×3 IMPLANT
SPECIMEN JAR SMALL (MISCELLANEOUS) ×3 IMPLANT
SUT MNCRL AB 4-0 PS2 18 (SUTURE) ×3 IMPLANT
TOWEL GREEN STERILE (TOWEL DISPOSABLE) ×3 IMPLANT
TOWEL GREEN STERILE FF (TOWEL DISPOSABLE) ×3 IMPLANT
TRAY LAPAROSCOPIC MC (CUSTOM PROCEDURE TRAY) ×3 IMPLANT
TROCAR XCEL BLUNT TIP 100MML (ENDOMECHANICALS) ×3 IMPLANT
TROCAR XCEL NON-BLD 11X100MML (ENDOMECHANICALS) ×3 IMPLANT
TROCAR XCEL NON-BLD 5MMX100MML (ENDOMECHANICALS) ×3 IMPLANT
WATER STERILE IRR 1000ML POUR (IV SOLUTION) ×3 IMPLANT

## 2019-12-16 NOTE — Anesthesia Procedure Notes (Signed)
Procedure Name: Intubation Date/Time: 12/16/2019 2:11 PM Performed by: Barrington Ellison, CRNA Pre-anesthesia Checklist: Patient identified, Emergency Drugs available, Suction available and Patient being monitored Patient Re-evaluated:Patient Re-evaluated prior to induction Oxygen Delivery Method: Circle System Utilized Preoxygenation: Pre-oxygenation with 100% oxygen Induction Type: IV induction Ventilation: Mask ventilation without difficulty Laryngoscope Size: Mac and 3 Grade View: Grade I Tube type: Oral Tube size: 7.0 mm Number of attempts: 1 Airway Equipment and Method: Stylet and Oral airway Placement Confirmation: ETT inserted through vocal cords under direct vision,  positive ETCO2 and breath sounds checked- equal and bilateral Secured at: 20 cm Tube secured with: Tape Dental Injury: Teeth and Oropharynx as per pre-operative assessment

## 2019-12-16 NOTE — Discharge Instructions (Signed)
[facility] ______[facility] °LAPAROSCOPIC SURGERY: POST OP INSTRUCTIONS °Always review your discharge instruction sheet given to you by the facility where your surgery was performed. °IF YOU HAVE DISABILITY OR FAMILY LEAVE FORMS, YOU MUST BRING THEM TO THE OFFICE FOR PROCESSING.   °DO NOT GIVE THEM TO YOUR DOCTOR. ° °1. A prescription for pain medication may be given to you upon discharge.  Take your pain medication as prescribed, if needed.  If narcotic pain medicine is not needed, then you may take acetaminophen (Tylenol) or ibuprofen (Advil) as needed. °2. Take your usually prescribed medications unless otherwise directed. °3. If you need a refill on your pain medication, please contact your pharmacy.  They will contact our office to request authorization. Prescriptions will not be filled after 5pm or on week-ends. °4. You should follow a light diet the first few days after arrival home, such as soup and crackers, etc.  Be sure to include lots of fluids daily. °5. Most patients will experience some swelling and bruising in the area of the incisions.  Ice packs will help.  Swelling and bruising can take several days to resolve.  °6. It is common to experience some constipation if taking pain medication after surgery.  Increasing fluid intake and taking a stool softener (such as Colace) will usually help or prevent this problem from occurring.  A mild laxative (Milk of Magnesia or Miralax) should be taken according to package instructions if there are no bowel movements after 48 hours. °7. Unless discharge instructions indicate otherwise, you may remove your bandages 24-48 hours after surgery, and you may shower at that time.  You may have steri-strips (small skin tapes) in place directly over the incision.  These strips should be left on the skin for 7-10 days.  If your surgeon used skin glue on the incision, you may shower in 24 hours.  The glue will flake off over the next 2-3 weeks.  Any sutures or  staples will be removed at the office during your follow-up visit. °8. ACTIVITIES:  You may resume regular (light) daily activities beginning the next day--such as daily self-care, walking, climbing stairs--gradually increasing activities as tolerated.  You may have sexual intercourse when it is comfortable.  Refrain from any heavy lifting or straining until approved by your doctor. °a. You may drive when you are no longer taking prescription pain medication, you can comfortably wear a seatbelt, and you can safely maneuver your car and apply brakes. °b. RETURN TO WORK:  __________________________________________________________ °9. You should see your doctor in the office for a follow-up appointment approximately 2-3 weeks after your surgery.  Make sure that you call for this appointment within a day or two after you arrive home to insure a convenient appointment time. °10. OTHER INSTRUCTIONS: __________________________________________________________________________________________________________________________ __________________________________________________________________________________________________________________________ °WHEN TO CALL YOUR DOCTOR: °1. Fever over 101.0 °2. Inability to urinate °3. Continued bleeding from incision. °4. Increased pain, redness, or drainage from the incision. °5. Increasing abdominal pain ° °The clinic staff is available to answer your questions during regular business hours.  Please don’t hesitate to call and ask to speak to one of the nurses for clinical concerns.  If you have a medical emergency, go to the nearest emergency room or call 911.  A surgeon from [facility] is always on call at the hospital. °[address], [address], [place], [place]  [address] ? [address] [address], [place], [place]   [address] °[phone] ? [phone] ? FAX [phone] °Web site:   [email or web address] °

## 2019-12-16 NOTE — H&P (Signed)
Christina Silva  Location: [facility] Patient #: [id number] DOB: 1932-11-03 Widowed / Language: Cleophus Molt / Race: White Female  History of Present Illness  Patient words: Patient sent at the request of Dr. Carlean Purl of lower gastrointestinal for gallstones. The patient was seen last all wrist in the emergency room for chest pain. Workup included a cardiac workup which was negative but she was found to have gallstones on ultrasound. She has intermittent history of dyspepsia, heartburn, epigastric abdominal pain after eating which is fairly constant each month. She also has bouts of exacerbation of nausea and vomiting after fatty meals. Upper endoscopy by Dr. Carlean Purl was normal. She denies yellowing of the eyes the skin. Fatty foods seem to make her symptoms worse.           RIGHT upper quadrant pain for 1 month  EXAM: ULTRASOUND ABDOMEN LIMITED RIGHT UPPER QUADRANT  COMPARISON: None.  FINDINGS: Gallbladder:  Multiple echogenic gallstones fill the lumen the gallbladder measuring up to 1 cm. Posterior wall shadowing. No gallbladder distention or pericholecystic fluid. Negative sonographic Murphy's sign.  Common bile duct:  Diameter: Upper limits of normal at 6 mm  Liver:  Liver has a nodular contour. There is mild increase in liver echogenicity. No biliary duct dilatation portal vein is patent on color Doppler imaging with normal direction of blood flow towards the liver.  Other: No free fluid  IMPRESSION: 1. Cholelithiasis without evidence acute cholecystitis. 2. Nodule liver and increased liver echogenicity could represent hepatocellular disease such as cirrhosis. 3. No biliary duct dilatation. Common bile duct upper limits of normal.   Electronically Signed By: Suzy Bouchard M.D. On: 04/09/2019 17:44.  The patient is a 86 year old female.   Past Surgical History  Appendectomy Cataract Surgery  Bilateral. Hysterectomy (not due to cancer) - Complete  Diagnostic Studies HistoryColonoscopy 5-10 years ago Mammogram >3 years ago Pap Smear >5 years ago  AllergiesCodeine Phosphate *ANALGESICS - OPIOID* Morphine Sulfate (Concentrate) *ANALGESICS - OPIOID* Sulfa Antibiotics Lisinopril & Diet Manage Prod *ANTIHYPERTENSIVES* Allergies Reconciled  Social History  No alcohol use No drug use  Family History Alcohol Abuse Father.  Pregnancy / Birth History Age at menarche 89 years. Gravida 2 Maternal age 3-25 Para 2 Regular periods  Other Problems  Anxiety Disorder Cerebrovascular Accident Cholelithiasis High blood pressure Hypercholesterolemia Oophorectomy     Review of Systems  General Not Present- Appetite Loss, Chills, Fatigue, Fever, Night Sweats, Weight Gain and Weight Loss. HEENT Present- Seasonal Allergies and Wears glasses/contact lenses. Not Present- Earache, Hearing Loss, Hoarseness, Nose Bleed, Oral Ulcers, Ringing in the Ears, Sinus Pain, Sore Throat, Visual Disturbances and Yellow Eyes. Gastrointestinal Present- Abdominal Pain and Indigestion. Not Present- Bloating, Bloody Stool, Change in Bowel Habits, Chronic diarrhea, Constipation, Difficulty Swallowing, Excessive gas, Gets full quickly at meals, Hemorrhoids, Nausea, Rectal Pain and Vomiting. Musculoskeletal Present- Joint Pain. Not Present- Back Pain, Joint Stiffness, Muscle Pain, Muscle Weakness and Swelling of Extremities. Psychiatric Present- Anxiety. Not Present- Bipolar, Change in Sleep Pattern, Depression, Fearful and Frequent crying. Endocrine Not Present- Cold Intolerance, Excessive Hunger, Hair Changes, Heat Intolerance, Hot flashes and New Diabetes. Hematology Present- Blood Thinners. Not Present- Easy Bruising, Excessive bleeding, Gland problems, HIV and Persistent Infections.  Vitals 10/13/2019 2:13 PM Weight: 137.5 lb Height: 64.5in Body Surface Area:  1.68 m Body Mass Index: 23.24 kg/m  Temp.: 98.71F  Pulse: 93 (Regular)  BP: 120/70 (Sitting, Left Arm, Standard)        Physical Exam  General Mental Status-Alert. General Appearance-Consistent  with stated age. Hydration-Well hydrated. Voice-Normal.  Head and Neck Head-normocephalic, atraumatic with no lesions or palpable masses.  Eye Eyeball - Bilateral-Extraocular movements intact. Sclera/Conjunctiva - Bilateral-No scleral icterus.  Chest and Lung Exam Chest and lung exam reveals -quiet, even and easy respiratory effort with no use of accessory muscles and on auscultation, normal breath sounds, no adventitious sounds and normal vocal resonance. Inspection Chest Wall - Normal. Back - normal.  Cardiovascular Cardiovascular examination reveals -on palpation PMI is normal in location and amplitude, no palpable S3 or S4. Normal cardiac borders., normal heart sounds, regular rate and rhythm with no murmurs, carotid auscultation reveals no bruits and normal pedal pulses bilaterally.  Abdomen Inspection Inspection of the abdomen reveals - No Hernias. Skin - Scar - no surgical scars. Palpation/Percussion Palpation and Percussion of the abdomen reveal - Soft, Non Tender, No Rebound tenderness, No Rigidity (guarding) and No hepatosplenomegaly. Auscultation Auscultation of the abdomen reveals - Bowel sounds normal.  Neurologic Neurologic evaluation reveals -alert and oriented x 3 with no impairment of recent or remote memory. Mental Status-Normal.  Musculoskeletal Normal Exam - Left-Upper Extremity Strength Normal and Lower Extremity Strength Normal. Normal Exam - Right-Upper Extremity Strength Normal, Lower Extremity Weakness.    Assessment & Plan  SYMPTOMATIC CHOLELITHIASIS (K80.20) Impression: Discussed laparoscopic cholecystectomy with possible cholangiogram. Discussed nonoperative management. She has opted for  laparoscopic cholecystectomy for symptomatic cholelithiasis The procedure has been discussed with the patient. Risks of laparoscopic cholecystectomy include bleeding, infection, bile duct injury, leak, death, open surgery, diarrhea, other surgery, organ injury, blood vessel injury, DVT, and additional care.  Total time 30 minutes for documentation, examination, discussion of surgery, discussion of complications, discussion of other options of treatment  Current Plans You are being scheduled for surgery- Our schedulers will call you.  You should hear from our office's scheduling department within 5 working days about the location, date, and time of surgery. We try to make accommodations for patient's preferences in scheduling surgery, but sometimes the OR schedule or the surgeon's schedule prevents [place] from making those accommodations.  If you have not heard from our office [phone]) in 5 working days, call the office and ask for your surgeon's nurse.  If you have other questions about your diagnosis, plan, or surgery, call the office and ask for your surgeon's nurse.  Pt Education - [facility] Good Bowel Health ([place]) The anatomy & physiology of hepatobiliary & pancreatic function was discussed. The pathophysiology of gallbladder dysfunction was discussed. Natural history risks without surgery was discussed. I feel the risks of no intervention will lead to serious problems that outweigh the operative risks; therefore, I recommended cholecystectomy to remove the pathology. I explained laparoscopic techniques with possible need for an open approach. Probable cholangiogram to evaluate the bilary tract was explained as well.  Risks such as bleeding, infection, abscess, leak, injury to other organs, need for further treatment, heart attack, death, and other risks were discussed. I noted a good likelihood this will help address the problem. Possibility that this will not correct all  abdominal symptoms was explained. Goals of post-operative recovery were discussed as well. We will work to minimize complications. An educational handout further explaining the pathology and treatment options was given as well. Questions were answered. The patient expresses understanding & wishes to proceed with surgery.

## 2019-12-16 NOTE — Transfer of Care (Signed)
Immediate Anesthesia Transfer of Care Note  Patient: Christina Silva  Procedure(s) Performed: LAPAROSCOPIC CHOLECYSTECTOMY WITH INTRAOPERATIVE CHOLANGIOGRAM (N/A Abdomen)  Patient Location: PACU  Anesthesia Type:General  Level of Consciousness: awake and oriented  Airway & Oxygen Therapy: Patient Spontanous Breathing  Post-op Assessment: Report given to RN  Post vital signs: Reviewed and stable  Last Vitals:  Vitals Value Taken Time  BP    Temp    Pulse 94 12/16/19 1521  Resp    SpO2 92 % 12/16/19 1521  Vitals shown include unvalidated device data.  Last Pain:  Vitals:   12/16/19 1310  TempSrc: Oral      Patients Stated Pain Goal: 2 ([id number])  Complications: No apparent anesthesia complications

## 2019-12-16 NOTE — Interval H&P Note (Signed)
History and Physical Interval Note:  12/16/2019 1:49 PM  Christina Silva  has presented today for surgery, with the diagnosis of gallstones.  The various methods of treatment have been discussed with the patient and family. After consideration of risks, benefits and other options for treatment, the patient has consented to  Procedure(s): LAPAROSCOPIC CHOLECYSTECTOMY WITH INTRAOPERATIVE CHOLANGIOGRAM (N/A) as a surgical intervention.  The patient's history has been reviewed, patient examined, no change in status, stable for surgery.  I have reviewed the patient's chart and labs.  Questions were answered to the patient's satisfaction.     [place]

## 2019-12-16 NOTE — Op Note (Signed)
Laparoscopic Cholecystectomy with IOC Procedure Note  Indications: This patient presents with symptomatic gallbladder disease and will undergo laparoscopic cholecystectomy. The procedure has been discussed with the patient. Operative and non operative treatments have been discussed. Risks of surgery include bleeding, infection,  Common bile duct injury,  Injury to the stomach,liver, colon,small intestine, abdominal wall,  Diaphragm,  Major blood vessels,  And the need for an open procedure.  Other risks include worsening of medical problems, death,  DVT and pulmonary embolism, and cardiovascular events.   Medical options have also been discussed. The patient has been informed of long term expectations of surgery and non surgical options,  The patient agrees to proceed.    Pre-operative Diagnosis: Calculus of gallbladder without mention of cholecystitis or obstruction  Post-operative Diagnosis: Same  Surgeon: Turner Daniels MD   Assistants [facility] RNFA   Anesthesia: General endotracheal anesthesia and Local anesthesia 0.25.% bupivacaine  ASA Class: 2  Procedure Details  The patient was seen again in the Holding Room. The risks, benefits, complications, treatment options, and expected outcomes were discussed with the patient. The possibilities of reaction to medication, pulmonary aspiration, perforation of viscus, bleeding, recurrent infection, finding a normal gallbladder, the need for additional procedures, failure to diagnose a condition, the possible need to convert to an open procedure, and creating a complication requiring transfusion or operation were discussed with the patient. The patient and/or family concurred with the proposed plan, giving informed consent. The site of surgery properly noted/marked. The patient was taken to Operating Room, identified as Christina Silva and the procedure verified as Laparoscopic Cholecystectomy with Intraoperative Cholangiograms. A Time Out was held and  the above information confirmed.  Prior to the induction of general anesthesia, antibiotic prophylaxis was administered. General endotracheal anesthesia was then administered and tolerated well. After the induction, the abdomen was prepped in the usual sterile fashion. The patient was positioned in the supine position with the left arm comfortably tucked, along with some reverse Trendelenburg.  Local anesthetic agent was injected into the skin near the umbilicus and an incision made. The midline fascia was incised and the Hasson technique was used to introduce a 12 mm port under direct vision. It was secured with a figure of eight Vicryl suture placed in the usual fashion. Pneumoperitoneum was then created with CO2 and tolerated well without any adverse changes in the patient's vital signs. Additional trocars were introduced under direct vision with an 11 mm trocar in the epigastrium and two  5 mm trocars in the right upper quadrant. All skin incisions were infiltrated with a local anesthetic agent before making the incision and placing the trocars.   The gallbladder was identified, the fundus grasped and retracted cephalad. Adhesions were lysed bluntly and with the electrocautery where indicated, taking care not to injure any adjacent organs or viscus. The infundibulum was grasped and retracted laterally, exposing the peritoneum overlying the triangle of Calot. This was then divided and exposed in a blunt fashion. The cystic duct was clearly identified and bluntly dissected circumferentially. The junctions of the gallbladder, cystic duct and common bile duct were clearly identified prior to the division of any linear structure.   An incision was made in the cystic duct and the cholangiogram catheter introduced. The catheter was secured using an endoclip. The study showed no stones and good visualization of the distal and proximal biliary tree. The catheter was then removed.   The cystic duct was then   ligated with surgical clips  on the  patient side and  clipped on the gallbladder side and divided. The cystic artery was identified, dissected free, ligated with clips and divided as well. Posterior cystic artery clipped and divided.  The gallbladder was dissected from the liver bed in retrograde fashion with the electrocautery. The gallbladder was removed. The liver bed was irrigated and inspected. Hemostasis was achieved with the electrocautery. Copious irrigation was utilized and was repeatedly aspirated until clear all particulate matter. Hemostasis was achieved with no signs  Of bleeding or bile leakage.  Pneumoperitoneum was completely reduced after viewing removal of the trocars under direct vision. The wound was thoroughly irrigated and the fascia was then closed with a figure of eight suture; the skin was then closed with 4 0 MONOCRYL  and a sterile dressing was applied.  Instrument, sponge, and needle counts were correct at closure and at the conclusion of the case.   Findings:  Cholelithiasis  Estimated Blood Loss: Minimal         Drains: none          Total IV Fluids: per record          Specimens: Gallbladder           Complications: None; patient tolerated the procedure well.         Disposition: PACU - hemodynamically stable.         Condition: stable

## 2019-12-18 LAB — SURGICAL PATHOLOGY

## 2019-12-22 NOTE — Anesthesia Postprocedure Evaluation (Signed)
Anesthesia Post Note  Patient: Christina Silva  Procedure(s) Performed: LAPAROSCOPIC CHOLECYSTECTOMY WITH INTRAOPERATIVE CHOLANGIOGRAM (N/A Abdomen)     Patient location during evaluation: PACU Anesthesia Type: General Level of consciousness: awake and alert Pain management: pain level controlled Vital Signs Assessment: post-procedure vital signs reviewed and stable Respiratory status: spontaneous breathing, nonlabored ventilation, respiratory function stable and patient connected to nasal cannula oxygen Cardiovascular status: blood pressure returned to baseline and stable Postop Assessment: no apparent nausea or vomiting Anesthetic complications: no    Last Vitals:  Vitals:   12/16/19 1538 12/16/19 1554  BP: (!) 158/62 140/62  Pulse:  79  Resp: 12 (!) 24  Temp:  (!) 36.2 C  SpO2:      Last Pain:  Vitals:   12/16/19 1310  TempSrc: Oral                 [name]

## 2019-12-29 ENCOUNTER — Other Ambulatory Visit: Payer: Self-pay | Admitting: Nurse Practitioner

## 2019-12-29 DIAGNOSIS — F411 Generalized anxiety disorder: Secondary | ICD-10-CM

## 2020-01-14 IMAGING — MR MR HEAD W/O CM
8 of 10 series · 39 of 48 positions shown · non-contrast
Comparison: CT head without contrast 10/18/2018

CLINICAL DATA: New onset left upper extremity weakness beginning
yesterday morning.

EXAM:
MRI HEAD WITHOUT CONTRAST
TECHNIQUE: Multiplanar, multiecho pulse sequences of the brain and surrounding
structures were obtained without intravenous contrast.

[Series 2: DWI · axial · 3.0mm · 0.72mm/px · z∈[-85,+77]mm · 6 of 55 slices shown (1 of 4)]
[im 1/55]
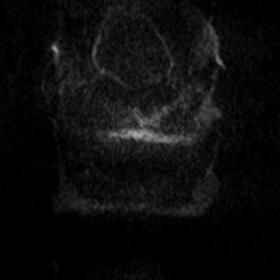
[im 11/55]
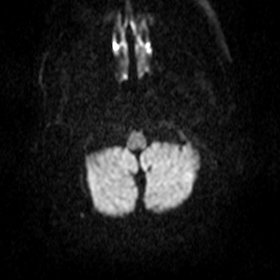
[im 22/55]
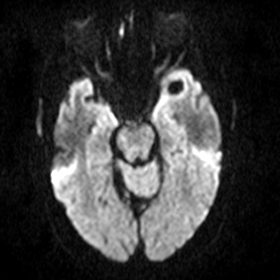
[im 33/55]
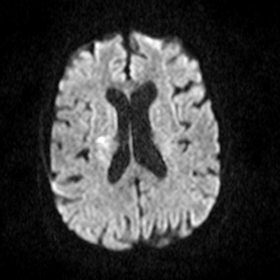
[im 44/55]
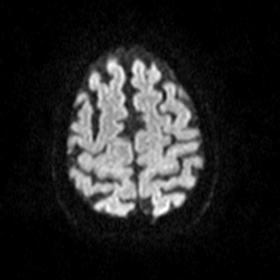
[im 55/55]
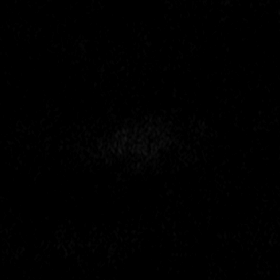

[Series 3: DWI · axial · 3.0mm · 0.72mm/px · z∈[-85,+74]mm · 6 of 54 slices shown (2 of 4)]
[im 1/54]
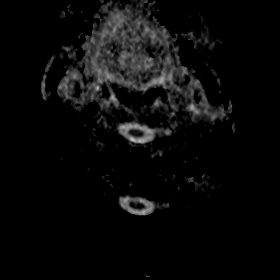
[im 11/54]
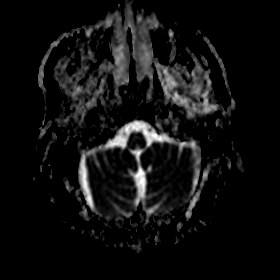
[im 22/54]
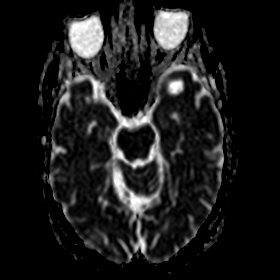
[im 32/54]
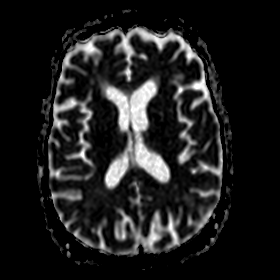
[im 43/54]
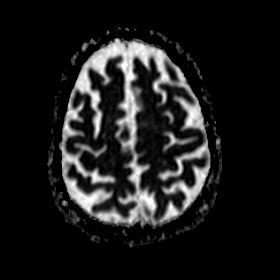
[im 54/54]
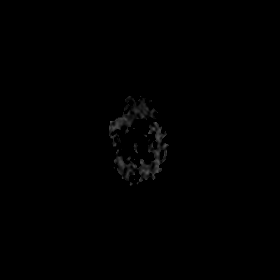

[Series 4: DWI · coronal · 5.0mm · 0.44mm/px · 4 of 34 slices shown (3 of 4)]
[im 1/34]
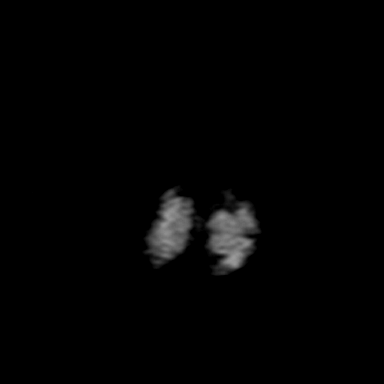
[im 12/34]
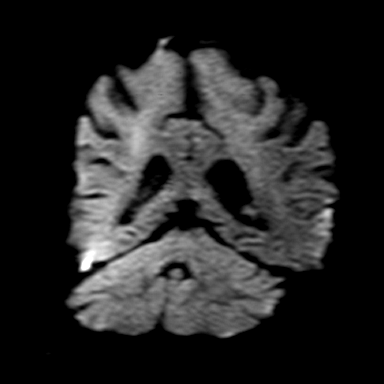
[im 23/34]
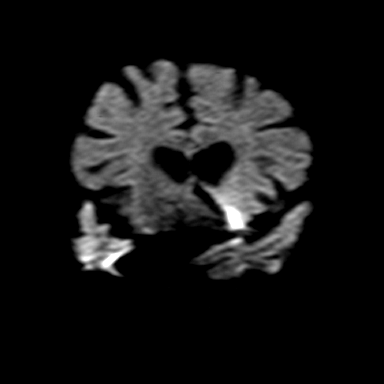
[im 34/34]
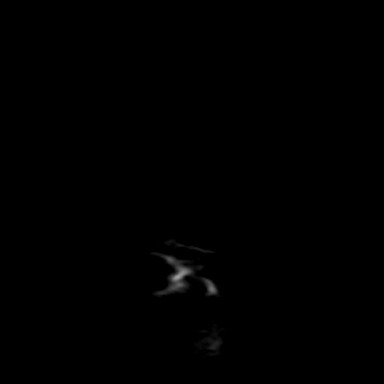

[Series 5: DWI · coronal · 5.0mm · 0.44mm/px · 4 of 34 slices shown (4 of 4)]
[im 1/34]
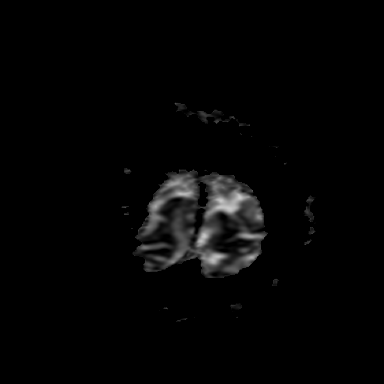
[im 12/34]
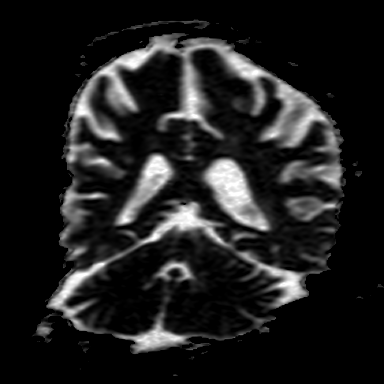
[im 23/34]
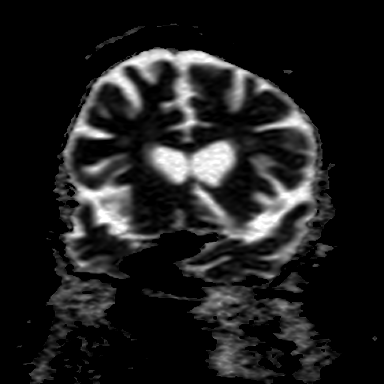
[im 34/34]
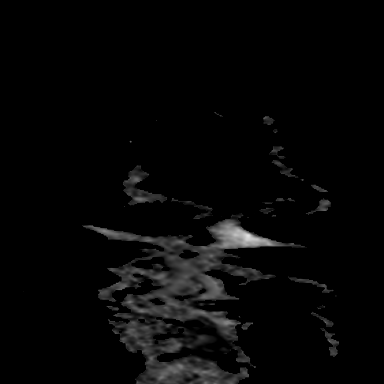

[Series 6: T2 · axial · 5.0mm · 0.63mm/px · z∈[-75,+67]mm · 3 of 23 slices shown (1 of 2)]
[im 1/23]
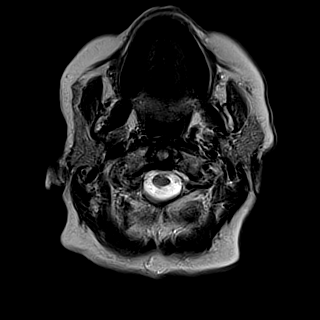
[im 12/23]
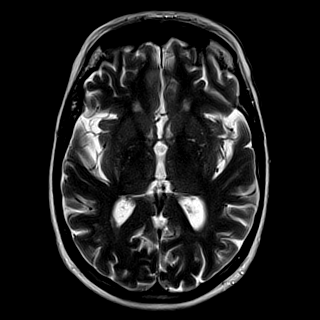
[im 23/23]
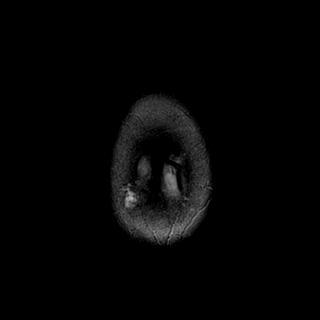

[Series 7: FLAIR · axial · 3.0mm · 0.79mm/px · z∈[-73,+65]mm · 5 of 47 slices shown]
[im 1/47]
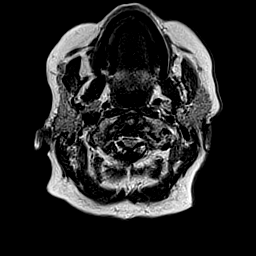
[im 12/47]
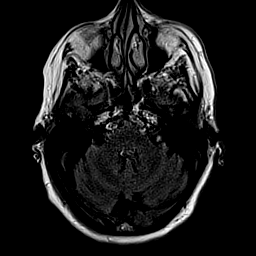
[im 24/47]
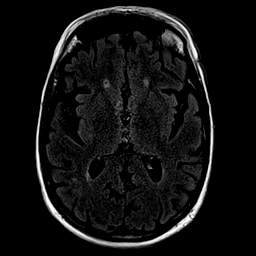
[im 35/47]
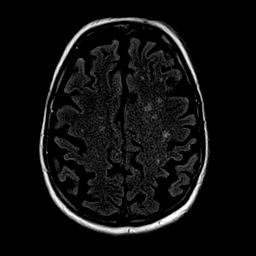
[im 47/47]
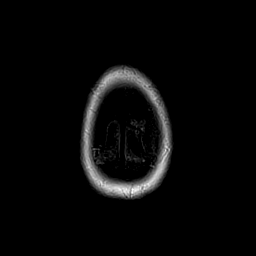

[Series 8: T1 · axial · 2.0mm · 0.40mm/px · z∈[-107,+98]mm · 8 of 104 slices shown]
[im 1/104]
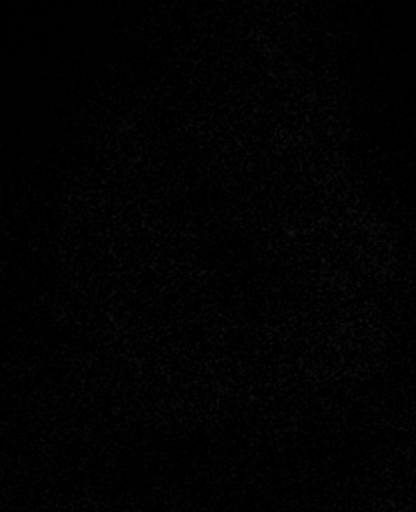
[im 19/104]
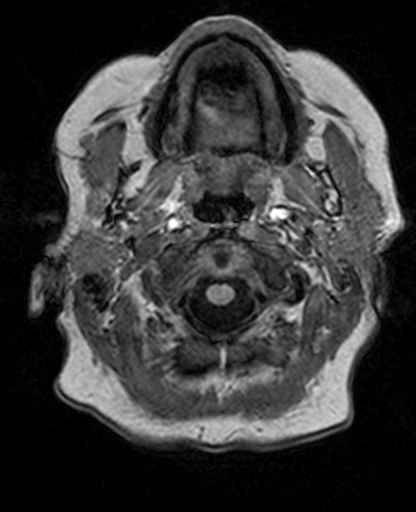
[im 29/104]
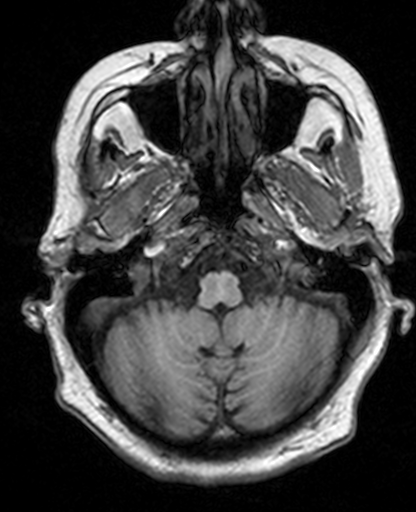
[im 47/104]
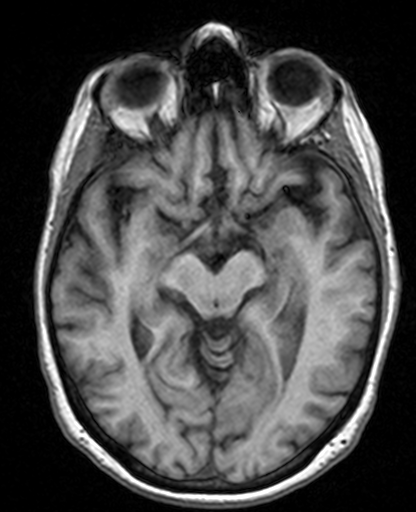
[im 57/104]
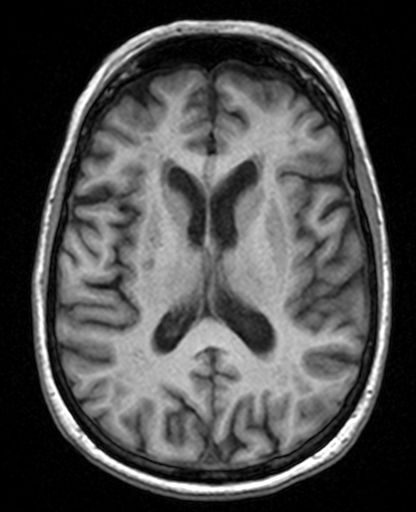
[im 75/104]
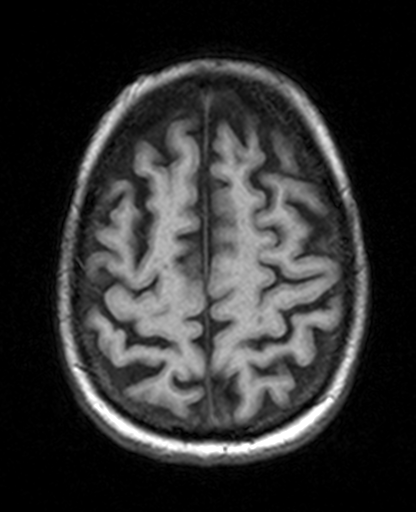
[im 85/104]
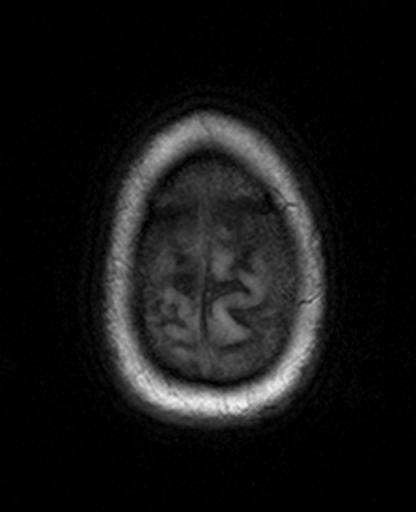
[im 104/104]
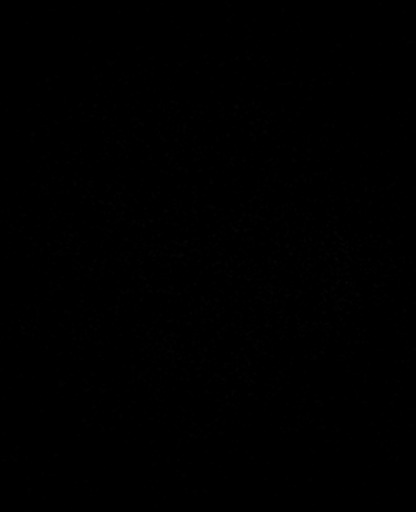

[Series 10: T2 · coronal · 5.0mm · 0.53mm/px · 3 of 28 slices shown (2 of 2)]
[im 1/28]
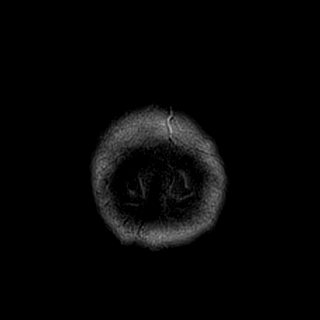
[im 14/28]
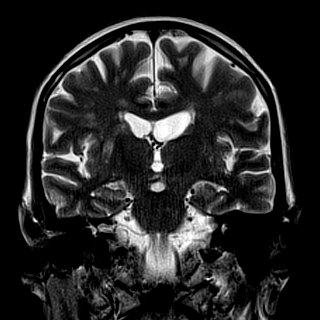
[im 28/28]
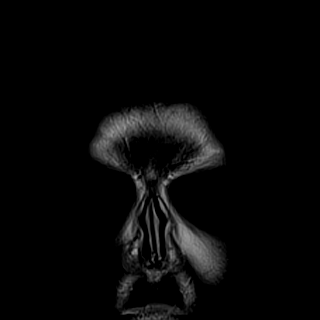

[39 of 48 positions shown; findings below may reference images not displayed]

FINDINGS: Brain: Diffusion-weighted images demonstrates an acute
nonhemorrhagic infarct involving the right lentiform nucleus and
centrum semi ovale. No associated hemorrhage is present. There is no
significant mass effect.

Moderate periventricular and scattered subcortical T2 signal changes
are present bilaterally. The ventricles are of proportionate to the
degree of atrophy. No significant extraaxial fluid collection is
present.

Vascular: Flow is present in the major intracranial arteries.

Skull and upper cervical spine: The craniocervical junction is
normal. Upper cervical spine is within normal limits. Marrow signal
is unremarkable.

Sinuses/Orbits: The paranasal sinuses and mastoid air cells are
clear. Bilateral lens replacements are noted. Globes and orbits are
otherwise unremarkable.
IMPRESSION: 1. Acute nonhemorrhagic infarct measuring up to 2 cm from the right
lentiform nucleus into the right centrum semi ovale.
2. Moderate atrophy and white matter disease otherwise likely
reflects the sequela of chronic microvascular ischemia.

## 2020-03-02 ENCOUNTER — Telehealth: Payer: Self-pay | Admitting: Nurse Practitioner

## 2020-03-15 DIAGNOSIS — E785 Hyperlipidemia, unspecified: Secondary | ICD-10-CM | POA: Diagnosis not present

## 2020-03-15 DIAGNOSIS — G43119 Migraine with aura, intractable, without status migrainosus: Secondary | ICD-10-CM | POA: Diagnosis not present

## 2020-03-15 DIAGNOSIS — I10 Essential (primary) hypertension: Secondary | ICD-10-CM | POA: Diagnosis not present

## 2020-03-15 DIAGNOSIS — I6930 Unspecified sequelae of cerebral infarction: Secondary | ICD-10-CM | POA: Diagnosis not present

## 2020-03-23 ENCOUNTER — Encounter: Payer: Self-pay | Admitting: Nurse Practitioner

## 2020-03-23 ENCOUNTER — Other Ambulatory Visit: Payer: Self-pay

## 2020-03-23 ENCOUNTER — Ambulatory Visit (INDEPENDENT_AMBULATORY_CARE_PROVIDER_SITE_OTHER): Payer: Medicare Other | Admitting: Nurse Practitioner

## 2020-03-23 VITALS — BP 129/65 | HR 83 | Temp 98.2°F | Resp 20 | Ht 64.0 in | Wt 129.0 lb

## 2020-03-23 DIAGNOSIS — K219 Gastro-esophageal reflux disease without esophagitis: Secondary | ICD-10-CM | POA: Diagnosis not present

## 2020-03-23 DIAGNOSIS — R739 Hyperglycemia, unspecified: Secondary | ICD-10-CM

## 2020-03-23 DIAGNOSIS — F411 Generalized anxiety disorder: Secondary | ICD-10-CM

## 2020-03-23 DIAGNOSIS — E785 Hyperlipidemia, unspecified: Secondary | ICD-10-CM | POA: Diagnosis not present

## 2020-03-23 DIAGNOSIS — I10 Essential (primary) hypertension: Secondary | ICD-10-CM | POA: Diagnosis not present

## 2020-03-23 DIAGNOSIS — I639 Cerebral infarction, unspecified: Secondary | ICD-10-CM

## 2020-03-23 DIAGNOSIS — I6930 Unspecified sequelae of cerebral infarction: Secondary | ICD-10-CM | POA: Diagnosis not present

## 2020-03-23 MED ORDER — AMLODIPINE BESYLATE 2.5 MG PO TABS: 2.5000 mg | ORAL_TABLET | Freq: Every evening | ORAL | 1 refills | Status: DC

## 2020-03-23 MED ORDER — PANTOPRAZOLE SODIUM 40 MG PO TBEC: 40.0000 mg | DELAYED_RELEASE_TABLET | Freq: Every day | ORAL | 1 refills | Status: DC

## 2020-03-23 MED ORDER — CLOPIDOGREL BISULFATE 75 MG PO TABS: 75.0000 mg | ORAL_TABLET | Freq: Every day | ORAL | 1 refills | Status: DC

## 2020-03-23 MED ORDER — DIAZEPAM 2 MG PO TABS: ORAL_TABLET | ORAL | 2 refills | Status: DC

## 2020-03-23 MED ORDER — LOSARTAN POTASSIUM 100 MG PO TABS: 100.0000 mg | ORAL_TABLET | Freq: Every day | ORAL | 1 refills | Status: DC

## 2020-03-23 NOTE — Patient Instructions (Signed)

## 2020-03-23 NOTE — Progress Notes (Signed)
Subjective:    Patient ID: Christina Silva, female    DOB: 1933-08-05, 86 y.o.   MRN: [id number]   Chief Complaint: medical management of chronic issues     HPI:  1. Essential hypertension No c/o chest pain, sob or headache. Does not check blood pressure at home. BP Readings from Last 3 Encounters:  12/16/19 140/62  12/12/19 140/64  11/03/19 (!) 144/75     2. Hyperlipidemia with target LDL less than 100 Does try to watch diet. Does very little exercise but stays active. She is on crestor daily.  Lab Results  Component Value Date   CHOL 246 (H) 09/24/2019   HDL 62 09/24/2019   LDLCALC 153 (H) 09/24/2019   TRIG 171 (H) 09/24/2019   CHOLHDL 4.0 09/24/2019     3. Hyperglycemia blood sugars have been elevated some. She does not check them at home. Lab Results  Component Value Date   HGBA1C 6.1 04/09/2019     4. Gastroesophageal reflux disease without esophagitis Is on protonix daily and is doing well.  5. Late effect of cerebrovascular accident (CVA) Has no residual effects from her stroke earlier in the year.  6. Anxiety state Has had anxiety since her husband passed away 2 years ago. GAD 7 : Generalized Anxiety Score 03/23/2020 09/24/2019 11/27/2017  Nervous, Anxious, on Edge '1 1 3  ' Control/stop worrying 0 0 3  Worry too much - different things 0 0 3  Trouble relaxing 0 1 3  Restless 0 0 3  Easily annoyed or irritable 0 0 3  Afraid - awful might happen 0 1 2  Total GAD 7 Score '1 3 20  ' Anxiety Difficulty Not difficult at all Not difficult at all Very difficult       Outpatient Encounter Medications as of 03/23/2020  Medication Sig  . diazepam (VALIUM) 2 MG tablet TAKE ONE TABLET TWICE DAILY AS NEEDED  . alum & mag hydroxide-simeth (MYLANTA) 791-505-69 MG/5ML suspension Take 30 mLs by mouth every 6 (six) hours as needed for indigestion or heartburn.  Marland Kitchen amLODipine (NORVASC) 2.5 MG tablet Take 1 tablet (2.5 mg total) by mouth daily. (Patient taking  differently: Take 2.5 mg by mouth every evening. )  . aspirin EC 81 MG tablet Take 81 mg by mouth daily.  . Brimonidine Tartrate (LUMIFY) 0.025 % SOLN Place 1 drop into both eyes 3 (three) times daily as needed (dry/irritated eyes.).  Marland Kitchen Cholecalciferol (VITAMIN D3) 50 MCG (2000 UT) capsule Take 2,000 Units by mouth daily.  . clopidogrel (PLAVIX) 75 MG tablet Take 1 tablet (75 mg total) by mouth daily.  . fluticasone (FLONASE) 50 MCG/ACT nasal spray Place 1 spray into both nostrils daily as needed for allergies or rhinitis.  Marland Kitchen HYDROcodone-acetaminophen (NORCO/VICODIN) 5-325 MG tablet Take 1 tablet by mouth every 6 (six) hours as needed for moderate pain.  Marland Kitchen losartan (COZAAR) 100 MG tablet Take 1 tablet (100 mg total) by mouth daily. (Patient taking differently: Take 100 mg by mouth every evening. )  . Multiple Vitamin (MULTIVITAMIN WITH MINERALS) TABS tablet Take 1 tablet by mouth daily.  . pantoprazole (PROTONIX) 40 MG tablet Take 1 tablet (40 mg total) by mouth daily before breakfast.  . ROLAIDS 550-110 MG CHEW Chew 1 tablet by mouth 4 (four) times daily as needed (indigestion/heartburn.).  Marland Kitchen rosuvastatin (CRESTOR) 5 MG tablet Take 1 tablet (5 mg total) by mouth daily.     Past Surgical History:  Procedure Laterality Date  . ABDOMINAL HYSTERECTOMY  1980  .  CATARACT EXTRACTION Right   . CHOLECYSTECTOMY N/A 12/16/2019   Procedure: LAPAROSCOPIC CHOLECYSTECTOMY WITH INTRAOPERATIVE CHOLANGIOGRAM;  Surgeon: Erroll Luna, MD;  Location: [place];  Service: General;  Laterality: N/A;  . EYE SURGERY    . KNEE ARTHROSCOPY Left   . ROTATOR CUFF REPAIR Left   . SQUAMOUS CELL CARCINOMA EXCISION Left   . TONSILLECTOMY      Family History  Problem Relation Age of Onset  . Heart attack Mother   . Hyperlipidemia Mother   . Heart disease Mother   . Transient ischemic attack Father   . Stroke Father   . Psychiatric Illness Sister   . Heart disease Sister   . Arthritis Brother   . Epilepsy Brother    . Colon cancer Neg Hx   . Esophageal cancer Neg Hx   . Rectal cancer Neg Hx   . Stomach cancer Neg Hx     New complaints: None today  Social history: Lives by herself but family checks on her daily  Controlled substance contract: n/a    Review of Systems  Constitutional: Negative for diaphoresis.  Eyes: Negative for pain.  Respiratory: Negative for shortness of breath.   Cardiovascular: Negative for chest pain, palpitations and leg swelling.  Gastrointestinal: Negative for abdominal pain.  Endocrine: Negative for polydipsia.  Skin: Negative for rash.  Neurological: Negative for dizziness, weakness and headaches.  Hematological: Does not bruise/bleed easily.  All other systems reviewed and are negative.      Objective:   Physical Exam Vitals and nursing note reviewed.  Constitutional:      General: She is not in acute distress.    Appearance: Normal appearance. She is well-developed.  HENT:     Head: Normocephalic.     Nose: Nose normal.  Eyes:     Pupils: Pupils are equal, round, and reactive to light.  Neck:     Vascular: No carotid bruit or JVD.  Cardiovascular:     Rate and Rhythm: Normal rate and regular rhythm.     Heart sounds: Normal heart sounds.  Pulmonary:     Effort: Pulmonary effort is normal. No respiratory distress.     Breath sounds: Normal breath sounds. No wheezing or rales.  Chest:     Chest wall: No tenderness.  Abdominal:     General: Bowel sounds are normal. There is no distension or abdominal bruit.     Palpations: Abdomen is soft. There is no hepatomegaly, splenomegaly, mass or pulsatile mass.     Tenderness: There is no abdominal tenderness.  Musculoskeletal:        General: Normal range of motion.     Cervical back: Normal range of motion and neck supple.  Lymphadenopathy:     Cervical: No cervical adenopathy.  Skin:    General: Skin is warm and dry.  Neurological:     Mental Status: She is alert and oriented to person, place,  and time.     Deep Tendon Reflexes: Reflexes are normal and symmetric.  Psychiatric:        Behavior: Behavior normal.        Thought Content: Thought content normal.        Judgment: Judgment normal.    BP (!) 129/65   Pulse 83   Temp 98.2 F (36.8 C) (Temporal)   Resp 20   Ht '5\' 4"'  (1.626 m)   Wt 129 lb (58.5 kg)   SpO2 94%   BMI 22.14 kg/m  Assessment & Plan:  Christina Silva comes in today with chief complaint of Medical Management of Chronic Issues   Diagnosis and orders addressed:  1. Essential hypertension Low sodium diet - amLODipine (NORVASC) 2.5 MG tablet; Take 1 tablet (2.5 mg total) by mouth every evening.  Dispense: 90 tablet; Refill: 1 - losartan (COZAAR) 100 MG tablet; Take 1 tablet (100 mg total) by mouth daily.  Dispense: 90 tablet; Refill: 1 - CBC with Differential/Platelet - CMP14+EGFR  2. Hyperlipidemia with target LDL less than 100 Low fat diet - Lipid panel  3. Hyperglycemia  4. Gastroesophageal reflux disease without esophagitis Avoid spicy foods Do not eat 2 hours prior to bedtime - pantoprazole (PROTONIX) 40 MG tablet; Take 1 tablet (40 mg total) by mouth daily before breakfast.  Dispense: 90 tablet; Refill: 1  5. Late effect of cerebrovascular accident (CVA) Fall prevention  6. Anxiety state - diazepam (VALIUM) 2 MG tablet; TAKE ONE TABLET TWICE DAILY AS NEEDED  Dispense: 60 tablet; Refill: 2  7. Acute CVA (cerebrovascular accident) ([place]) - clopidogrel (PLAVIX) 75 MG tablet; Take 1 tablet (75 mg total) by mouth daily.  Dispense: 90 tablet; Refill: 1   Labs pending Health Maintenance reviewed Diet and exercise encouraged  Follow up plan: 6 months   Mary-Margaret Hassell Done, FNP

## 2020-03-24 LAB — CBC WITH DIFFERENTIAL/PLATELET
Basophils Absolute: 0.1 10*3/uL (ref 0.0–0.2)
Basos: 1 %
EOS (ABSOLUTE): 0.1 10*3/uL (ref 0.0–0.4)
Eos: 1 %
Hematocrit: 44.3 % (ref 34.0–46.6)
Hemoglobin: 14.8 g/dL (ref 11.1–15.9)
Immature Grans (Abs): 0 10*3/uL (ref 0.0–0.1)
Immature Granulocytes: 0 %
Lymphocytes Absolute: 2.4 10*3/uL (ref 0.7–3.1)
Lymphs: 31 %
MCH: 30.9 pg (ref 26.6–33.0)
MCHC: 33.4 g/dL (ref 31.5–35.7)
MCV: 93 fL (ref 79–97)
Monocytes Absolute: 0.7 10*3/uL (ref 0.1–0.9)
Monocytes: 9 %
Neutrophils Absolute: 4.4 10*3/uL (ref 1.4–7.0)
Neutrophils: 58 %
Platelets: 306 10*3/uL (ref 150–450)
RBC: 4.79 x10E6/uL (ref 3.77–5.28)
RDW: 12.1 % (ref 11.7–15.4)
WBC: 7.5 10*3/uL (ref 3.4–10.8)

## 2020-03-24 LAB — CMP14+EGFR
ALT: 15 IU/L (ref 0–32)
AST: 20 IU/L (ref 0–40)
Albumin/Globulin Ratio: 2.2 (ref 1.2–2.2)
Albumin: 4.7 g/dL — ABNORMAL HIGH (ref 3.6–4.6)
Alkaline Phosphatase: 90 IU/L (ref 48–121)
BUN/Creatinine Ratio: 17 (ref 12–28)
BUN: 13 mg/dL (ref 8–27)
Bilirubin Total: 0.6 mg/dL (ref 0.0–1.2)
CO2: 25 mmol/L (ref 20–29)
Calcium: 10.4 mg/dL — ABNORMAL HIGH (ref 8.7–10.3)
Chloride: 103 mmol/L (ref 96–106)
Creatinine, Ser: 0.76 mg/dL (ref 0.57–1.00)
GFR calc Af Amer: 83 mL/min/{1.73_m2} (ref >59)
GFR calc non Af Amer: 72 mL/min/{1.73_m2} (ref >59)
Globulin, Total: 2.1 g/dL (ref 1.5–4.5)
Glucose: 89 mg/dL (ref 65–99)
Potassium: 4.4 mmol/L (ref 3.5–5.2)
Sodium: 141 mmol/L (ref 134–144)
Total Protein: 6.8 g/dL (ref 6.0–8.5)

## 2020-03-24 LAB — LIPID PANEL
Chol/HDL Ratio: 3 ratio (ref 0.0–4.4)
Cholesterol, Total: 203 mg/dL — ABNORMAL HIGH (ref 100–199)
HDL: 68 mg/dL (ref >39)
LDL Chol Calc (NIH): 111 mg/dL — ABNORMAL HIGH (ref 0–99)
Triglycerides: 137 mg/dL (ref 0–149)
VLDL Cholesterol Cal: 24 mg/dL (ref 5–40)

## 2020-04-12 ENCOUNTER — Emergency Department (HOSPITAL_COMMUNITY): Payer: Medicare Other

## 2020-04-12 ENCOUNTER — Telehealth: Payer: Self-pay | Admitting: *Deleted

## 2020-04-12 ENCOUNTER — Emergency Department (HOSPITAL_COMMUNITY)
Admission: EM | Admit: 2020-04-12 | Discharge: 2020-04-13 | Disposition: A | Discharge status: Home / Self Care | Payer: Medicare Other | Attending: Emergency Medicine | Admitting: Emergency Medicine

## 2020-04-12 ENCOUNTER — Encounter (HOSPITAL_COMMUNITY): Payer: Self-pay

## 2020-04-12 ENCOUNTER — Other Ambulatory Visit: Payer: Self-pay

## 2020-04-12 DIAGNOSIS — R6889 Other general symptoms and signs: Secondary | ICD-10-CM | POA: Diagnosis not present

## 2020-04-12 DIAGNOSIS — I6381 Other cerebral infarction due to occlusion or stenosis of small artery: Secondary | ICD-10-CM | POA: Diagnosis not present

## 2020-04-12 DIAGNOSIS — R519 Headache, unspecified: Secondary | ICD-10-CM | POA: Insufficient documentation

## 2020-04-12 DIAGNOSIS — G9389 Other specified disorders of brain: Secondary | ICD-10-CM | POA: Diagnosis not present

## 2020-04-12 DIAGNOSIS — Z8673 Personal history of transient ischemic attack (TIA), and cerebral infarction without residual deficits: Secondary | ICD-10-CM | POA: Insufficient documentation

## 2020-04-12 DIAGNOSIS — Z5321 Procedure and treatment not carried out due to patient leaving prior to being seen by health care provider: Secondary | ICD-10-CM | POA: Insufficient documentation

## 2020-04-12 DIAGNOSIS — H53132 Sudden visual loss, left eye: Secondary | ICD-10-CM | POA: Insufficient documentation

## 2020-04-12 DIAGNOSIS — G319 Degenerative disease of nervous system, unspecified: Secondary | ICD-10-CM | POA: Diagnosis not present

## 2020-04-12 DIAGNOSIS — R0902 Hypoxemia: Secondary | ICD-10-CM | POA: Diagnosis not present

## 2020-04-12 DIAGNOSIS — Z743 Need for continuous supervision: Secondary | ICD-10-CM | POA: Diagnosis not present

## 2020-04-12 DIAGNOSIS — H579 Unspecified disorder of eye and adnexa: Secondary | ICD-10-CM | POA: Diagnosis not present

## 2020-04-12 DIAGNOSIS — I499 Cardiac arrhythmia, unspecified: Secondary | ICD-10-CM | POA: Diagnosis not present

## 2020-04-12 LAB — DIFFERENTIAL
Abs Immature Granulocytes: 0.01 10*3/uL (ref 0.00–0.07)
Basophils Absolute: 0 10*3/uL (ref 0.0–0.1)
Basophils Relative: 1 %
Eosinophils Absolute: 0.1 10*3/uL (ref 0.0–0.5)
Eosinophils Relative: 2 %
Immature Granulocytes: 0 %
Lymphocytes Relative: 26 %
Lymphs Abs: 2 10*3/uL (ref 0.7–4.0)
Monocytes Absolute: 0.7 10*3/uL (ref 0.1–1.0)
Monocytes Relative: 9 %
Neutro Abs: 4.9 10*3/uL (ref 1.7–7.7)
Neutrophils Relative %: 62 %

## 2020-04-12 LAB — CBC
HCT: 44.2 % (ref 36.0–46.0)
Hemoglobin: 14.2 g/dL (ref 12.0–15.0)
MCH: 29.9 pg (ref 26.0–34.0)
MCHC: 32.1 g/dL (ref 30.0–36.0)
MCV: 93.1 fL (ref 80.0–100.0)
Platelets: 295 10*3/uL (ref 150–400)
RBC: 4.75 MIL/uL (ref 3.87–5.11)
RDW: 11.9 % (ref 11.5–15.5)
WBC: 7.7 10*3/uL (ref 4.0–10.5)
nRBC: 0 % (ref 0.0–0.2)

## 2020-04-12 LAB — COMPREHENSIVE METABOLIC PANEL
ALT: 13 U/L (ref 0–44)
AST: 19 U/L (ref 15–41)
Albumin: 4 g/dL (ref 3.5–5.0)
Alkaline Phosphatase: 67 U/L (ref 38–126)
Anion gap: 11 (ref 5–15)
BUN: 14 mg/dL (ref 8–23)
CO2: 25 mmol/L (ref 22–32)
Calcium: 10 mg/dL (ref 8.9–10.3)
Chloride: 104 mmol/L (ref 98–111)
Creatinine, Ser: 0.64 mg/dL (ref 0.44–1.00)
GFR calc Af Amer: >60 mL/min (ref >60)
GFR calc non Af Amer: >60 mL/min (ref >60)
Glucose, Bld: 119 mg/dL — ABNORMAL HIGH (ref 70–99)
Potassium: 4 mmol/L (ref 3.5–5.1)
Sodium: 140 mmol/L (ref 135–145)
Total Bilirubin: 0.7 mg/dL (ref 0.3–1.2)
Total Protein: 6.9 g/dL (ref 6.5–8.1)

## 2020-04-12 LAB — PROTIME-INR
INR: 1 (ref 0.8–1.2)
Prothrombin Time: 12.5 seconds (ref 11.4–15.2)

## 2020-04-12 LAB — APTT: aPTT: 31 seconds (ref 24–36)

## 2020-04-12 MED ORDER — SODIUM CHLORIDE 0.9% FLUSH: 3.0000 mL | Freq: Once | INTRAVENOUS | Status: DC

## 2020-04-12 NOTE — Telephone Encounter (Signed)
Received call from Sharlot Gowda along with EMS stating that patient possibly had a TIA but EMS has no way of telling if she did or not. EMS states that BP readings are 176/90 and 196/96.  [facility] refuses to take patient to ER.  Bradd Canary and EMS that this office has no openings at this time. EMS requesting that patient BP medication be reevaluated until patient can be seen in office.

## 2020-04-12 NOTE — ED Triage Notes (Signed)
Pt reports on Thursday she was watching tv and she suddenly lost vision in her left eye for abour 20 mins and today she had another episode. Pt denies any numbness or weakness. Hx of stoke last year. Pt c.o headache behind her eyes. Pt a.o, nad noted

## 2020-04-13 NOTE — Telephone Encounter (Signed)
Please call patient and see what her blood pressure is right now.

## 2020-04-13 NOTE — Telephone Encounter (Signed)
Spoke with Sharlot Gowda.  Patient has been in the ER since 430, she has had blood work and CT scan.  Still waiting to see the doctor.  Miranda would like to know if you see if their is any abnormalities on either.

## 2020-04-13 NOTE — Telephone Encounter (Signed)
Christina Silva aware and states she will call Dr. Alessandra Grout office to see if an appt can be made.

## 2020-04-13 NOTE — Telephone Encounter (Signed)
Mostly unchanged, but has had stroke but is not calling it acute- could be old

## 2020-04-13 NOTE — ED Notes (Signed)
Pt approached and stated that she would check myChart for her results and follow up with her PCP as needed. Wristband removed.

## 2020-04-14 DIAGNOSIS — I10 Essential (primary) hypertension: Secondary | ICD-10-CM | POA: Diagnosis not present

## 2020-04-14 DIAGNOSIS — E785 Hyperlipidemia, unspecified: Secondary | ICD-10-CM | POA: Diagnosis not present

## 2020-04-14 DIAGNOSIS — G43119 Migraine with aura, intractable, without status migrainosus: Secondary | ICD-10-CM | POA: Diagnosis not present

## 2020-04-14 DIAGNOSIS — G459 Transient cerebral ischemic attack, unspecified: Secondary | ICD-10-CM | POA: Diagnosis not present

## 2020-04-14 DIAGNOSIS — G466 Pure sensory lacunar syndrome: Secondary | ICD-10-CM | POA: Diagnosis not present

## 2020-04-16 ENCOUNTER — Other Ambulatory Visit: Payer: Self-pay | Admitting: Neurology

## 2020-04-16 DIAGNOSIS — G459 Transient cerebral ischemic attack, unspecified: Secondary | ICD-10-CM

## 2020-04-22 ENCOUNTER — Other Ambulatory Visit: Payer: Self-pay

## 2020-04-22 ENCOUNTER — Ambulatory Visit (HOSPITAL_COMMUNITY)
Admission: RE | Admit: 2020-04-22 | Discharge: 2020-04-22 | Disposition: A | Discharge status: Home / Self Care | Payer: Medicare Other | Source: Ambulatory Visit | Attending: Neurology | Admitting: Neurology

## 2020-04-22 DIAGNOSIS — G459 Transient cerebral ischemic attack, unspecified: Secondary | ICD-10-CM | POA: Diagnosis not present

## 2020-04-22 DIAGNOSIS — I6523 Occlusion and stenosis of bilateral carotid arteries: Secondary | ICD-10-CM | POA: Diagnosis not present

## 2020-04-22 DIAGNOSIS — I771 Stricture of artery: Secondary | ICD-10-CM | POA: Diagnosis not present

## 2020-05-12 DIAGNOSIS — I10 Essential (primary) hypertension: Secondary | ICD-10-CM | POA: Diagnosis not present

## 2020-05-24 DIAGNOSIS — G459 Transient cerebral ischemic attack, unspecified: Secondary | ICD-10-CM | POA: Diagnosis not present

## 2020-05-24 DIAGNOSIS — I499 Cardiac arrhythmia, unspecified: Secondary | ICD-10-CM | POA: Diagnosis not present

## 2020-05-24 DIAGNOSIS — R6889 Other general symptoms and signs: Secondary | ICD-10-CM | POA: Diagnosis not present

## 2020-05-24 DIAGNOSIS — Z743 Need for continuous supervision: Secondary | ICD-10-CM | POA: Diagnosis not present

## 2020-05-24 DIAGNOSIS — R404 Transient alteration of awareness: Secondary | ICD-10-CM | POA: Diagnosis not present

## 2020-06-01 DIAGNOSIS — G459 Transient cerebral ischemic attack, unspecified: Secondary | ICD-10-CM | POA: Diagnosis not present

## 2020-06-01 DIAGNOSIS — G43119 Migraine with aura, intractable, without status migrainosus: Secondary | ICD-10-CM | POA: Diagnosis not present

## 2020-06-01 DIAGNOSIS — I6930 Unspecified sequelae of cerebral infarction: Secondary | ICD-10-CM | POA: Diagnosis not present

## 2020-06-01 DIAGNOSIS — I10 Essential (primary) hypertension: Secondary | ICD-10-CM | POA: Diagnosis not present

## 2020-06-03 ENCOUNTER — Ambulatory Visit (INDEPENDENT_AMBULATORY_CARE_PROVIDER_SITE_OTHER): Payer: Medicare Other | Admitting: *Deleted

## 2020-06-03 DIAGNOSIS — Z0000 Encounter for general adult medical examination without abnormal findings: Secondary | ICD-10-CM

## 2020-06-03 NOTE — Progress Notes (Signed)
MEDICARE ANNUAL WELLNESS VISIT  06/03/2020  Telephone Visit Disclaimer This Medicare AWV was conducted by telephone due to national recommendations for restrictions regarding the COVID-19 Pandemic (e.g. social distancing).  I verified, using two identifiers, that I am speaking with [name] or their authorized healthcare agent. I discussed the limitations, risks, security, and privacy concerns of performing an evaluation and management service by telephone and the potential availability of an in-person appointment in the future. The patient expressed understanding and agreed to proceed.  Location of Patient: home Location of Provider (nurse): office  Subjective:    ROSARIA KUBIN is a 86 y.o. female patient of [name], [place] who had a Medicare Annual Wellness Visit today via telephone. Dava is Retired and lives alone. she has 2 children. she reports that she is socially active and does interact with friends/family regularly. she is not physically active and enjoys playing [place] and church.  Patient Care Team: [name], FNP as PCP - General (Nurse Practitioner) Martinique, Peter M, MD as PCP - Cardiology (Cardiology) Phillips Odor, MD as Consulting Physician (Neurology) [place], Peter M, MD as Consulting Physician (Cardiology)  Advanced Directives 06/03/2020 12/16/2019 12/12/2019 05/01/2019 05/01/2019 03/19/2019 10/18/2018  Does Patient Have a Medical Advance Directive? Yes - Yes Yes Yes Yes No  Type of Advance Directive [place];Living will - [place];Living will Living will;Healthcare Power of [place];Living will -  Does patient want to make changes to medical advance directive? No - Patient declined - No - Patient declined No - Patient declined - - -  Copy of [place] in Chart? No - copy requested No - copy requested No - copy requested No - copy requested - No -  copy requested -  Would patient like information on creating a medical advance directive? - - - - - No - Patient declined No - Patient declined    Hospital Utilization Over the Past 12 Months: # of hospitalizations or ER visits: 1 # of surgeries: 1  Review of Systems    Patient reports that her overall health is worse compared to last year.  History obtained from chart review and the patient  Patient Reported Readings (BP, Pulse, CBG, Weight, etc) none  Pain Assessment Pain : No/denies pain     Current Medications & Allergies (verified) Allergies as of 06/03/2020      Reactions   Codeine Nausea And Vomiting   Lisinopril Swelling   Morphine Nausea And Vomiting   Sulfa Antibiotics Nausea Only, Rash   Sulfonamide Derivatives Nausea Only, Rash      Medication List       Accurate as of June 03, 2020 10:17 AM. If you have any questions, ask your nurse or doctor.        STOP taking these medications   HYDROcodone-acetaminophen 5-325 MG tablet Commonly known as: NORCO/VICODIN   pantoprazole 40 MG tablet Commonly known as: PROTONIX     TAKE these medications   amLODipine 2.5 MG tablet Commonly known as: NORVASC Take 1 tablet (2.5 mg total) by mouth every evening.   aspirin EC 81 MG tablet Take 81 mg by mouth daily. Swallow whole.   clopidogrel 75 MG tablet Commonly known as: PLAVIX Take 1 tablet (75 mg total) by mouth daily.   diazepam 2 MG tablet Commonly known as: VALIUM TAKE ONE TABLET TWICE DAILY AS NEEDED   losartan 100 MG tablet Commonly known as: COZAAR Take  1 tablet (100 mg total) by mouth daily.   Lumify 0.025 % Soln Generic drug: Brimonidine Tartrate Place 1 drop into both eyes 3 (three) times daily as needed (dry/irritated eyes.).   rosuvastatin 5 MG tablet Commonly known as: CRESTOR Take 1 tablet (5 mg total) by mouth daily.   Vitamin D3 50 MCG (2000 UT) capsule Take 2,000 Units by mouth daily.       History (reviewed): Past Medical  History:  Diagnosis Date  . Anxiety   . Cataract   . GERD (gastroesophageal reflux disease)   . Hyperlipidemia   . Hypertension   . Stroke ([place]) 09/2018  . Vertigo    patient denies   Past Surgical History:  Procedure Laterality Date  . ABDOMINAL HYSTERECTOMY  1980  . CATARACT EXTRACTION Right   . CHOLECYSTECTOMY N/A 12/16/2019   Procedure: LAPAROSCOPIC CHOLECYSTECTOMY WITH INTRAOPERATIVE CHOLANGIOGRAM;  Surgeon: Erroll Luna, MD;  Location: [facility];  Service: General;  Laterality: N/A;  . EYE SURGERY    . KNEE ARTHROSCOPY Left   . ROTATOR CUFF REPAIR Left   . SQUAMOUS CELL CARCINOMA EXCISION Left   . TONSILLECTOMY     Family History  Problem Relation Age of Onset  . Heart attack Mother   . Hyperlipidemia Mother   . Heart disease Mother   . Transient ischemic attack Father   . Stroke Father   . Psychiatric Illness Sister   . Heart disease Sister   . Arthritis Brother   . Epilepsy Brother   . Colon cancer Neg Hx   . Esophageal cancer Neg Hx   . Rectal cancer Neg Hx   . Stomach cancer Neg Hx    Social History   Socioeconomic History  . Marital status: Widowed    Spouse name: Not on file  . Number of children: 2  . Years of education: 10  . Highest education level: Not on file  Occupational History  . Occupation: retired  Tobacco Use  . Smoking status: Former Smoker    Types: Cigarettes    Quit date: 01/09/1983    Years since quitting: 37.4  . Smokeless tobacco: Never Used  Vaping Use  . Vaping Use: Never used  Substance and Sexual Activity  . Alcohol use: No  . Drug use: No  . Sexual activity: Yes  Other Topics Concern  . Not on file  Social History Narrative   Widowed in 2018   Retired   A daughter lives nearby   Former smoker no alcohol tobacco or drug use now   Social Determinants of Radio broadcast assistant Strain: Low Risk   . Difficulty of Paying Living Expenses: Not hard at all  Food Insecurity: No Food Insecurity  . Worried About  Charity fundraiser in the Last Year: Never true  . Ran Out of Food in the Last Year: Never true  Transportation Needs:   . Lack of Transportation (Medical): Not on file  . Lack of Transportation (Non-Medical): Not on file  Physical Activity:   . Days of Exercise per Week: Not on file  . Minutes of Exercise per Session: Not on file  Stress: No Stress Concern Present  . Feeling of Stress : Only a little  Social Connections: Moderately Integrated  . Frequency of Communication with Friends and Family: More than three times a week  . Frequency of Social Gatherings with Friends and Family: More than three times a week  . Attends Religious Services: More than 4 times per  year  . Active Member of Clubs or Organizations: Yes  . Attends Archivist Meetings: 1 to 4 times per year  . Marital Status: Widowed    Activities of Daily Living In your present state of health, do you have any difficulty performing the following activities: 06/03/2020 12/12/2019  Hearing? N N  Vision? N N  Difficulty concentrating or making decisions? Y N  Comment minimal -  Walking or climbing stairs? N N  Dressing or bathing? N N  Comment uses shower chair -  Doing errands, shopping? N N  Preparing Food and eating ? N -  Using the Toilet? N -  In the past six months, have you accidently leaked urine? N -  Do you have problems with loss of bowel control? N -  Managing your Medications? N -  Managing your Finances? N -  Housekeeping or managing your Housekeeping? N -  Some recent data might be hidden    Patient Education/ Literacy How often do you need to have someone help you when you read instructions, pamphlets, or other written materials from your doctor or pharmacy?: 2 - Rarely What is the last grade level you completed in school?: 10  Exercise Current Exercise Habits: The patient does not participate in regular exercise at present, Exercise limited by: None identified  Diet Patient reports  consuming 2 meals a day and 1 snack(s) a day Patient reports that her primary diet is: Low Sodium Patient reports that she does have regular access to food.   Depression Screen PHQ 2/9 Scores 06/03/2020 03/23/2020 09/24/2019 05/12/2019 05/01/2019 10/29/2018 09/26/2018  PHQ - 2 Score 2 0 0 0 0 0 0  PHQ- 9 Score 3 - - - - - -     Fall Risk Fall Risk  06/03/2020 03/23/2020 09/24/2019 05/12/2019 05/01/2019  Falls in the past year? 0 0 0 0 0     Objective:  [name] seemed alert and oriented and she participated appropriately during our telephone visit.  Blood Pressure Weight BMI  BP Readings from Last 3 Encounters:  04/13/20 136/76  03/23/20 (!) 129/65  12/16/19 140/62   Wt Readings from Last 3 Encounters:  04/12/20 134 lb (60.8 kg)  03/23/20 129 lb (58.5 kg)  12/16/19 134 lb (60.8 kg)   BMI Readings from Last 1 Encounters:  04/12/20 23.00 kg/m    *Unable to obtain current vital signs, weight, and BMI due to telephone visit type  Hearing/Vision  . Earlyn did not seem to have difficulty with hearing/understanding during the telephone conversation . Reports that she has had a formal eye exam by an eye care professional within the past year . Reports that she has not had a formal hearing evaluation within the past year *Unable to fully assess hearing and vision during telephone visit type  Cognitive Function: 6CIT Screen 05/01/2019  What Year? 0 points  What month? 0 points  What time? 0 points  Count back from 20 0 points  Months in reverse 0 points  Repeat phrase 0 points  Total Score 0   (Normal:0-7, Significant for Dysfunction: >8)  Normal Cognitive Function Screening: Yes   Immunization & Health Maintenance Record Immunization History  Administered Date(s) Administered  . Fluad Quad(high Dose 65+) 05/29/2019  . Influenza, High Dose Seasonal PF 10/03/2016, 09/03/2017, 06/10/2018  . Influenza,inj,Quad PF,6+ Mos 05/24/2015  . Influenza-Unspecified 07/18/2018  .  Moderna SARS-COVID-2 Vaccination 09/09/2019, 10/10/2019  . Pneumococcal Conjugate-13 05/24/2015  . Pneumococcal Polysaccharide-23 05/29/2019  . Tdap  12/19/2010  . Zoster Recombinat (Shingrix) 06/30/2019, 11/18/2019    Health Maintenance  Topic Date Due  . INFLUENZA VACCINE  03/28/2020  . TETANUS/TDAP  12/18/2020  . DEXA SCAN  Completed  . COVID-19 Vaccine  Completed  . PNA vac Low Risk Adult  Completed       Assessment  This is a routine wellness examination for [name].  Health Maintenance: Due or Overdue Health Maintenance Due  Topic Date Due  . INFLUENZA VACCINE  03/28/2020    [name] does not need a referral for Community Assistance: Care Management:   no Social Work:    no Prescription Assistance:  no Nutrition/Diabetes Education:  no   Plan:  Personalized Goals Goals Addressed            This Visit's Progress   . DIET - REDUCE SODIUM INTAKE       Per her cardiologist per patient    . Prevent falls       Study surroundings carefully      Personalized Health Maintenance & Screening Recommendations  Influenza vaccine  Lung Cancer Screening Recommended: no (Low Dose CT Chest recommended if Age 69-80 years, 30 pack-year currently smoking OR have quit w/in past 15 years) Hepatitis C Screening recommended: no HIV Screening recommended: no  Advanced Directives: Written information was not prepared per patient's request.  Referrals & Orders No orders of the defined types were placed in this encounter.   Follow-up Plan . Follow-up with [name], FNP as planned on 09/23/20. Marland Kitchen Pt will get flu vaccine within next month. . All other health maintenance is up to date. . Pt remains independent with ADL's and still drives. . Pt has a living will and a power of attorney. . Pt voices no healthcare concerns today. . AVS printed and mailed.    I have personally reviewed and noted the following in the patient's chart:    . Medical and social history . Use of alcohol, tobacco or illicit drugs  . Current medications and supplements . Functional ability and status . Nutritional status . Physical activity . Advanced directives . List of other physicians . Hospitalizations, surgeries, and ER visits in previous 12 months . Vitals . Screenings to include cognitive, depression, and falls . Referrals and appointments  In addition, I have reviewed and discussed with [name] certain preventive protocols, quality metrics, and best practice recommendations. A written personalized care plan for preventive services as well as general preventive health recommendations is available and can be mailed to the patient at her request.      Rana Snare, LPN  [id number]

## 2020-07-01 ENCOUNTER — Other Ambulatory Visit: Payer: Self-pay | Admitting: Cardiology

## 2020-08-13 ENCOUNTER — Telehealth: Payer: Self-pay

## 2020-08-13 DIAGNOSIS — G459 Transient cerebral ischemic attack, unspecified: Secondary | ICD-10-CM

## 2020-08-13 NOTE — Telephone Encounter (Signed)
Please review

## 2020-08-16 ENCOUNTER — Ambulatory Visit (INDEPENDENT_AMBULATORY_CARE_PROVIDER_SITE_OTHER): Payer: Medicare Other | Admitting: *Deleted

## 2020-08-16 ENCOUNTER — Other Ambulatory Visit: Payer: Self-pay

## 2020-08-16 DIAGNOSIS — Z23 Encounter for immunization: Secondary | ICD-10-CM | POA: Diagnosis not present

## 2020-08-16 NOTE — Telephone Encounter (Signed)
Patients daughter calls in stating that patient has been having what appears to e more frequent TIA's. She has episodes that last 20- 30 minutes. She also has days when th white part of her right eye gets blood shot red and she feels pressure behind he reye. She would like to have MRA of brain to see if anything new is going on.

## 2020-08-18 ENCOUNTER — Other Ambulatory Visit: Payer: Self-pay | Admitting: Nurse Practitioner

## 2020-08-18 DIAGNOSIS — G459 Transient cerebral ischemic attack, unspecified: Secondary | ICD-10-CM

## 2020-08-18 NOTE — Progress Notes (Signed)
Ma brain

## 2020-08-31 ENCOUNTER — Ambulatory Visit: Payer: Medicare Other | Admitting: Nurse Practitioner

## 2020-09-01 ENCOUNTER — Other Ambulatory Visit: Payer: Self-pay

## 2020-09-01 ENCOUNTER — Ambulatory Visit (HOSPITAL_COMMUNITY)
Admission: RE | Admit: 2020-09-01 | Discharge: 2020-09-01 | Disposition: A | Discharge status: Home / Self Care | Payer: Medicare Other | Source: Ambulatory Visit | Attending: Nurse Practitioner | Admitting: Nurse Practitioner

## 2020-09-01 ENCOUNTER — Other Ambulatory Visit (HOSPITAL_COMMUNITY): Payer: Medicare Other

## 2020-09-01 DIAGNOSIS — I6623 Occlusion and stenosis of bilateral posterior cerebral arteries: Secondary | ICD-10-CM | POA: Diagnosis not present

## 2020-09-01 DIAGNOSIS — I6601 Occlusion and stenosis of right middle cerebral artery: Secondary | ICD-10-CM | POA: Diagnosis not present

## 2020-09-01 DIAGNOSIS — G459 Transient cerebral ischemic attack, unspecified: Secondary | ICD-10-CM | POA: Insufficient documentation

## 2020-09-02 ENCOUNTER — Ambulatory Visit (INDEPENDENT_AMBULATORY_CARE_PROVIDER_SITE_OTHER): Payer: Medicare Other | Admitting: Nurse Practitioner

## 2020-09-02 ENCOUNTER — Encounter: Payer: Self-pay | Admitting: Nurse Practitioner

## 2020-09-02 VITALS — BP 154/70 | HR 95 | Temp 97.7°F | Resp 20 | Ht 64.0 in | Wt 130.0 lb

## 2020-09-02 DIAGNOSIS — H539 Unspecified visual disturbance: Secondary | ICD-10-CM

## 2020-09-02 DIAGNOSIS — R42 Dizziness and giddiness: Secondary | ICD-10-CM

## 2020-09-02 DIAGNOSIS — G459 Transient cerebral ischemic attack, unspecified: Secondary | ICD-10-CM

## 2020-09-02 NOTE — Progress Notes (Signed)
Subjective:    Patient ID: Christina Silva, female    DOB: Aug 14, 1933, 87 y.o.   MRN: [id number]   Chief Complaint: Discuss MRA results   HPI Patient has been having episodes where she says she sees flashing lights and she feels light headed. usually last about 15 minutes. Her blood pressure is usually high during episodes. Has frequent headaches. She says she never blacksout. We did an MRA yesterday and she wants to discuss results. She has history of TIA and not sure if that is what is going on. Needs to follow up with [place]. Had an appointment scheduled for 2 days ago but cancelled but wanted MRA result before she went to see him.    Review of Systems  Eyes: Positive for visual disturbance (during episodes).  Cardiovascular: Negative.   Gastrointestinal: Negative.   Genitourinary: Negative.   Neurological: Positive for dizziness, weakness (after episodes) and headaches. Negative for seizures, syncope, facial asymmetry and numbness.  Psychiatric/Behavioral: Negative.   All other systems reviewed and are negative.      Objective:   Physical Exam Vitals and nursing note reviewed.  Constitutional:      General: She is not in acute distress.    Appearance: Normal appearance. She is well-developed and well-nourished.  HENT:     Head: Normocephalic.     Nose: Nose normal.     Mouth/Throat:     Mouth: Oropharynx is clear and moist.  Eyes:     Extraocular Movements: EOM normal.     Pupils: Pupils are equal, round, and reactive to light.  Neck:     Vascular: No carotid bruit or JVD.  Cardiovascular:     Rate and Rhythm: Normal rate and regular rhythm.     Pulses: Intact distal pulses.     Heart sounds: Normal heart sounds.  Pulmonary:     Effort: Pulmonary effort is normal. No respiratory distress.     Breath sounds: Normal breath sounds. No wheezing or rales.  Chest:     Chest wall: No tenderness.  Abdominal:     General: Bowel sounds are normal. There is no  distension or abdominal bruit. Aorta is normal.     Palpations: Abdomen is soft. There is no hepatomegaly, splenomegaly, mass or pulsatile mass.     Tenderness: There is no abdominal tenderness.  Musculoskeletal:        General: No edema. Normal range of motion.     Cervical back: Normal range of motion and neck supple.  Lymphadenopathy:     Cervical: No cervical adenopathy.  Skin:    General: Skin is warm and dry.  Neurological:     Mental Status: She is alert and oriented to person, place, and time.     Deep Tendon Reflexes: Reflexes are normal and symmetric.  Psychiatric:        Mood and Affect: Mood and affect normal.        Behavior: Behavior normal.        Thought Content: Thought content normal.        Judgment: Judgment normal.    BP (!) 154/70   Pulse 95   Temp 97.7 F (36.5 C) (Temporal)   Resp 20   Ht 5\' 4"  (1.626 m)   Wt 130 lb (59 kg)   SpO2 94%   BMI 22.31 kg/m         Assessment & Plan:  Christina Silva in today with chief complaint of Discuss MRA results   1.  Visual changes Needs eye exam  2. Dizziness  3. TIA (transient ischemic attack) Make follow up appointment with DR. Doonqu[place] Continue plavix as rx Keep diary of episodes    The above assessment and management plan was discussed with the patient. The patient verbalized understanding of and has agreed to the management plan. Patient is aware to call the clinic if symptoms persist or worsen. Patient is aware when to return to the clinic for a follow-up visit. Patient educated on when it is appropriate to go to the emergency department.   Mary-Margaret Daphine Deutscher, FNP

## 2020-09-02 NOTE — Patient Instructions (Signed)
Transient Ischemic Attack  A transient ischemic attack (TIA) is a "warning stroke" that causes stroke-like symptoms that go away quickly. A TIA does not cause lasting damage to the brain. But having a TIA is a sign that you may be at risk for a stroke. Lifestyle changes and medical treatments can help prevent a stroke. It is important to know the symptoms of a TIA and what to do. Get help right away, even if your symptoms go away. The symptoms of a TIA are the same as those of a stroke. They can happen fast, and they usually go away within minutes or hours. They can include:  Weakness or loss of feeling in your face, arm, or leg. This often happens on one side of your body.  Trouble walking.  Trouble moving your arms or legs.  Trouble talking or understanding what people are saying.  Trouble seeing.  Seeing two of one object (double vision).  Feeling dizzy.  Feeling confused.  Loss of balance or coordination.  Feeling sick to your stomach (nauseous) and throwing up (vomiting).  A very bad headache for no reason. What increases the risk? Certain things may make you more likely to have a TIA. Some of these are things that you can change, such as:  Being very overweight (obese).  Using products that contain nicotine or tobacco, such as cigarettes and e-cigarettes.  Taking birth control pills.  Not being active.  Drinking too much alcohol.  Using drugs. Other risk factors include:  Having an irregular heartbeat (atrial fibrillation).  Being African American or Hispanic.  Having had blood clots, stroke, TIA, or heart attack in the past.  Being a woman with a history of high blood pressure in pregnancy (preeclampsia).  Being over the age of 60.  Being female.  Having family history of stroke.  Having the following diseases or conditions: ? High blood pressure. ? High cholesterol. ? Diabetes. ? Heart disease. ? Sickle cell disease. ? Sleep apnea. ? Migraine  headache. ? Long-term (chronic) diseases that cause soreness and swelling (inflammation). ? Disorders that affect how your blood clots. Follow these instructions at home: Medicines   Take over-the-counter and prescription medicines only as told by your doctor.  If you were told to take aspirin or another medicine to thin your blood, take it exactly as told by your doctor. ? Taking too much of the medicine can cause bleeding. ? Taking too little of the medicine may not work to treat the problem. Eating and drinking   Eat 5 or more servings of fruits and vegetables each day.  Follow instructions from your doctor about your diet. You may need to follow a certain diet to help lower your risk of having a stroke. You may need to: ? Eat a diet that is low in fat and salt. ? Eat foods that contain a lot of fiber. ? Limit the amount of carbohydrates and sugar in your diet.  Limit alcohol intake to 1 drink a day for nonpregnant women and 2 drinks a day for men. One drink equals 12 oz of beer, 5 oz of wine, or 1 oz of hard liquor. General instructions  Keep a healthy weight.  Stay active. Try to get at least 30 minutes of activity on all or most days.  Find out if you have a condition called sleep apnea. Get treatment if needed.  Do not use any products that contain nicotine or tobacco, such as cigarettes and e-cigarettes. If you need help quitting,   ask your doctor.  Do not abuse drugs.  Keep all follow-up visits as told by your doctor. This is important. Get help right away if:  You have any signs of stroke. "BE FAST" is an easy way to remember the main warning signs: ? B - Balance. Signs are dizziness, sudden trouble walking, or loss of balance. ? E - Eyes. Signs are trouble seeing or a sudden change in how you see. ? F - Face. Signs are sudden weakness or loss of feeling of the face, or the face or eyelid drooping on one side. ? A - Arms. Signs are weakness or loss of feeling in an  arm. This happens suddenly and usually on one side of the body. ? S - Speech. Signs are sudden trouble speaking, slurred speech, or trouble understanding what people say. ? T - Time. Time to call emergency services. Write down what time symptoms started.  You have other signs of stroke, such as: ? A sudden, very bad headache with no known cause. ? Feeling sick to your stomach (nausea). ? Throwing up (vomiting). ? Jerky movements that you cannot control (seizure). These symptoms may be an emergency. Do not wait to see if the symptoms will go away. Get medical help right away. Call your local emergency services (911 in the [place] Do not drive yourself to the hospital. Summary  A transient ischemic attack (TIA) is a "warning stroke" that causes stroke-like symptoms that go away quickly.  A TIA is a medical emergency. Get help right away, even if your symptoms go away.  A TIA does not cause lasting damage to the brain.  Having a TIA is a sign that you may be at risk for a stroke. Lifestyle changes and medical treatments can help prevent a stroke. This information is not intended to replace advice given to you by your health care provider. Make sure you discuss any questions you have with your health care provider. Document Revised: 05/10/2018 Document Reviewed: 11/15/2016 Elsevier Patient Education  2020 Elsevier Inc.  

## 2020-09-03 LAB — CBC WITH DIFFERENTIAL/PLATELET
Basophils Absolute: 0.1 10*3/uL (ref 0.0–0.2)
Basos: 1 %
EOS (ABSOLUTE): 0.5 10*3/uL — ABNORMAL HIGH (ref 0.0–0.4)
Eos: 7 %
Hematocrit: 42.1 % (ref 34.0–46.6)
Hemoglobin: 14.3 g/dL (ref 11.1–15.9)
Immature Grans (Abs): 0 10*3/uL (ref 0.0–0.1)
Immature Granulocytes: 0 %
Lymphocytes Absolute: 1.5 10*3/uL (ref 0.7–3.1)
Lymphs: 22 %
MCH: 30.8 pg (ref 26.6–33.0)
MCHC: 34 g/dL (ref 31.5–35.7)
MCV: 91 fL (ref 79–97)
Monocytes Absolute: 0.7 10*3/uL (ref 0.1–0.9)
Monocytes: 10 %
Neutrophils Absolute: 3.9 10*3/uL (ref 1.4–7.0)
Neutrophils: 60 %
Platelets: 337 10*3/uL (ref 150–450)
RBC: 4.64 x10E6/uL (ref 3.77–5.28)
RDW: 12.1 % (ref 11.7–15.4)
WBC: 6.5 10*3/uL (ref 3.4–10.8)

## 2020-09-03 LAB — CMP14+EGFR
ALT: 9 IU/L (ref 0–32)
AST: 15 IU/L (ref 0–40)
Albumin/Globulin Ratio: 1.7 (ref 1.2–2.2)
Albumin: 4.3 g/dL (ref 3.6–4.6)
Alkaline Phosphatase: 102 IU/L (ref 44–121)
BUN/Creatinine Ratio: 20 (ref 12–28)
BUN: 14 mg/dL (ref 8–27)
Bilirubin Total: 0.4 mg/dL (ref 0.0–1.2)
CO2: 24 mmol/L (ref 20–29)
Calcium: 10.3 mg/dL (ref 8.7–10.3)
Chloride: 101 mmol/L (ref 96–106)
Creatinine, Ser: 0.71 mg/dL (ref 0.57–1.00)
GFR calc Af Amer: 90 mL/min/{1.73_m2} (ref >59)
GFR calc non Af Amer: 78 mL/min/{1.73_m2} (ref >59)
Globulin, Total: 2.6 g/dL (ref 1.5–4.5)
Glucose: 98 mg/dL (ref 65–99)
Potassium: 4.1 mmol/L (ref 3.5–5.2)
Sodium: 141 mmol/L (ref 134–144)
Total Protein: 6.9 g/dL (ref 6.0–8.5)

## 2020-09-07 ENCOUNTER — Other Ambulatory Visit: Payer: Self-pay | Admitting: Nurse Practitioner

## 2020-09-07 DIAGNOSIS — G459 Transient cerebral ischemic attack, unspecified: Secondary | ICD-10-CM | POA: Diagnosis not present

## 2020-09-07 DIAGNOSIS — I10 Essential (primary) hypertension: Secondary | ICD-10-CM

## 2020-09-07 DIAGNOSIS — G4452 New daily persistent headache (NDPH): Secondary | ICD-10-CM | POA: Diagnosis not present

## 2020-09-10 ENCOUNTER — Telehealth: Payer: Self-pay | Admitting: Nurse Practitioner

## 2020-09-10 DIAGNOSIS — E785 Hyperlipidemia, unspecified: Secondary | ICD-10-CM

## 2020-09-10 NOTE — Telephone Encounter (Signed)
Last LDL were barely elevated. I suspect they will still be good but can come and have checked anytime next  week.

## 2020-09-10 NOTE — Telephone Encounter (Signed)
Pt recieved her paper copy of lab results in the mail and would like to discuss with a nurse

## 2020-09-10 NOTE — Telephone Encounter (Signed)
Patient is concerned because her cholesterol numbers were not checked with her lab work.  She would like to know if you will order this and let her come in one day next week to have labs drawn.  Please advise.

## 2020-09-23 ENCOUNTER — Ambulatory Visit: Payer: Self-pay | Admitting: Nurse Practitioner

## 2020-09-29 ENCOUNTER — Other Ambulatory Visit (HOSPITAL_COMMUNITY): Payer: Self-pay | Admitting: Neurology

## 2020-09-29 DIAGNOSIS — G4452 New daily persistent headache (NDPH): Secondary | ICD-10-CM

## 2020-10-06 ENCOUNTER — Telehealth: Payer: Self-pay

## 2020-10-06 DIAGNOSIS — R55 Syncope and collapse: Secondary | ICD-10-CM

## 2020-10-06 NOTE — Telephone Encounter (Signed)
Patient would like to talk to you personally about changing neurologists.  She said she is just not happy with Dr. Merlene Laughter and would like to see someone else.  She is aware you are out of the office today but would like you to call her tomorrow when you can.

## 2020-10-07 NOTE — Telephone Encounter (Signed)
New referral to neurology place per [place], FNP

## 2020-10-07 NOTE — Telephone Encounter (Signed)
Patients wants to see a different neurologist. She saw DR. [place]onqu[place], did not like him. Wants   A second opinion with another neurologist.

## 2020-10-08 ENCOUNTER — Ambulatory Visit (HOSPITAL_COMMUNITY)
Admission: RE | Admit: 2020-10-08 | Discharge: 2020-10-08 | Disposition: A | Discharge status: Home / Self Care | Payer: Medicare Other | Source: Ambulatory Visit | Attending: Neurology | Admitting: Neurology

## 2020-10-08 ENCOUNTER — Other Ambulatory Visit: Payer: Self-pay

## 2020-10-08 DIAGNOSIS — G319 Degenerative disease of nervous system, unspecified: Secondary | ICD-10-CM | POA: Diagnosis not present

## 2020-10-08 DIAGNOSIS — G4452 New daily persistent headache (NDPH): Secondary | ICD-10-CM | POA: Insufficient documentation

## 2020-10-08 DIAGNOSIS — G9389 Other specified disorders of brain: Secondary | ICD-10-CM | POA: Diagnosis not present

## 2020-10-08 DIAGNOSIS — I6782 Cerebral ischemia: Secondary | ICD-10-CM | POA: Diagnosis not present

## 2020-10-19 ENCOUNTER — Encounter: Payer: Self-pay | Admitting: Neurology

## 2020-10-19 ENCOUNTER — Ambulatory Visit (INDEPENDENT_AMBULATORY_CARE_PROVIDER_SITE_OTHER): Payer: Medicare Other | Admitting: Neurology

## 2020-10-19 VITALS — BP 147/83 | HR 90 | Ht 64.0 in | Wt 132.0 lb

## 2020-10-19 DIAGNOSIS — G459 Transient cerebral ischemic attack, unspecified: Secondary | ICD-10-CM

## 2020-10-19 DIAGNOSIS — R799 Abnormal finding of blood chemistry, unspecified: Secondary | ICD-10-CM | POA: Diagnosis not present

## 2020-10-19 DIAGNOSIS — H532 Diplopia: Secondary | ICD-10-CM

## 2020-10-19 DIAGNOSIS — I639 Cerebral infarction, unspecified: Secondary | ICD-10-CM

## 2020-10-19 DIAGNOSIS — Z79899 Other long term (current) drug therapy: Secondary | ICD-10-CM | POA: Diagnosis not present

## 2020-10-19 NOTE — Patient Instructions (Signed)
I had a long discussion with the patient and her daughter regarding her prior history of cryptogenic stroke in February 2020 with more recent episodes of left-sided vision loss and new right posterior cerebral artery occlusion on MRA making a strong suspicion for paroxysmal A. fib.  I recommend loop recorder insertion for diagnosis of paroxysmal A. fib.  Continue aspirin 81 mg daily alone for stroke prevention and stop Plavix since she is having significant bruising and there is no daytime long-term dual antiplatelet therapy benefit to her stroke prevention.  Maintain aggressive risk factor modification with strict control of hypertension blood pressure goal below 130/90, diabetes with hemoglobin A1c goal below 6.5% and lipids with LDL cholesterol goal below 70 mg percent.  I also increase that she sees see an ophthalmologist to evaluate her intermittent symptoms of horizontal transient diplopia.  Recommend recheck lipid profile, hemoglobin A1c ESR today she will return for follow-up in the future in 3 months or call earlier if necessary.  Stroke Prevention Some medical conditions and behaviors are associated with a higher chance of having a stroke. You can help prevent a stroke by making nutrition, lifestyle, and other changes, including managing any medical conditions you may have. What nutrition changes can be made?  Eat healthy foods. You can do this by: ? Choosing foods high in fiber, such as fresh fruits and vegetables and whole grains. ? Eating at least 5 or more servings of fruits and vegetables a day. Try to fill half of your plate at each meal with fruits and vegetables. ? Choosing lean protein foods, such as lean cuts of meat, poultry without skin, fish, tofu, beans, and nuts. ? Eating low-fat dairy products. ? Avoiding foods that are high in salt (sodium). This can help lower blood pressure. ? Avoiding foods that have saturated fat, trans fat, and cholesterol. This can help prevent high  cholesterol. ? Avoiding processed and premade foods.  Follow your health care provider's specific guidelines for losing weight, controlling high blood pressure (hypertension), lowering high cholesterol, and managing diabetes. These may include: ? Reducing your daily calorie intake. ? Limiting your daily sodium intake to 1,500 milligrams (mg). ? Using only healthy fats for cooking, such as olive oil, canola oil, or sunflower oil. ? Counting your daily carbohydrate intake.   What lifestyle changes can be made?  Maintain a healthy weight. Talk to your health care provider about your ideal weight.  Get at least 30 minutes of moderate physical activity at least 5 days a week. Moderate activity includes brisk walking, biking, and swimming.  Do not use any products that contain nicotine or tobacco, such as cigarettes and e-cigarettes. If you need help quitting, ask your health care provider. It may also be helpful to avoid exposure to secondhand smoke.  Limit alcohol intake to no more than 1 drink a day for nonpregnant women and 2 drinks a day for men. One drink equals 12 oz of beer, 5 oz of wine, or 1 oz of hard liquor.  Stop any illegal drug use.  Avoid taking birth control pills. Talk to your health care provider about the risks of taking birth control pills if: ? You are over 85 years old. ? You smoke. ? You get migraines. ? You have ever had a blood clot. What other changes can be made?  Manage your cholesterol levels. ? Eating a healthy diet is important for preventing high cholesterol. If cholesterol cannot be managed through diet alone, you may also need to take medicines. ?  Take any prescribed medicines to control your cholesterol as told by your health care provider.  Manage your diabetes. ? Eating a healthy diet and exercising regularly are important parts of managing your blood sugar. If your blood sugar cannot be managed through diet and exercise, you may need to take  medicines. ? Take any prescribed medicines to control your diabetes as told by your health care provider.  Control your hypertension. ? To reduce your risk of stroke, try to keep your blood pressure below 130/80. ? Eating a healthy diet and exercising regularly are an important part of controlling your blood pressure. If your blood pressure cannot be managed through diet and exercise, you may need to take medicines. ? Take any prescribed medicines to control hypertension as told by your health care provider. ? Ask your health care provider if you should monitor your blood pressure at home. ? Have your blood pressure checked every year, even if your blood pressure is normal. Blood pressure increases with age and some medical conditions.  Get evaluated for sleep disorders (sleep apnea). Talk to your health care provider about getting a sleep evaluation if you snore a lot or have excessive sleepiness.  Take over-the-counter and prescription medicines only as told by your health care provider. Aspirin or blood thinners (antiplatelets or anticoagulants) may be recommended to reduce your risk of forming blood clots that can lead to stroke.  Make sure that any other medical conditions you have, such as atrial fibrillation or atherosclerosis, are managed. What are the warning signs of a stroke? The warning signs of a stroke can be easily remembered as BEFAST.  B is for balance. Signs include: ? Dizziness. ? Loss of balance or coordination. ? Sudden trouble walking.  E is for eyes. Signs include: ? A sudden change in vision. ? Trouble seeing.  F is for face. Signs include: ? Sudden weakness or numbness of the face. ? The face or eyelid drooping to one side.  A is for arms. Signs include: ? Sudden weakness or numbness of the arm, usually on one side of the body.  S is for speech. Signs include: ? Trouble speaking (aphasia). ? Trouble understanding.  T is for time. ? These symptoms may  represent a serious problem that is an emergency. Do not wait to see if the symptoms will go away. Get medical help right away. Call your local emergency services (911 in the [place] Do not drive yourself to the hospital.  Other signs of stroke may include: ? A sudden, severe headache with no known cause. ? Nausea or vomiting. ? Seizure. Where to find more information For more information, visit:  American Stroke Association: www.strokeassociation.org  National Stroke Association: www.stroke.org Summary  You can prevent a stroke by eating healthy, exercising, not smoking, limiting alcohol intake, and managing any medical conditions you may have.  Do not use any products that contain nicotine or tobacco, such as cigarettes and e-cigarettes. If you need help quitting, ask your health care provider. It may also be helpful to avoid exposure to secondhand smoke.  Remember BEFAST for warning signs of stroke. Get help right away if you or a loved one has any of these signs. This information is not intended to replace advice given to you by your health care provider. Make sure you discuss any questions you have with your health care provider. Document Revised: 07/27/2017 Document Reviewed: 09/19/2016 Elsevier Patient Education  2021 [employer].

## 2020-10-19 NOTE — Progress Notes (Signed)
[facility] [address] [place]. [place] [address] [phone]       OFFICE CONSULT NOTE  [name] Date of Birth:  1933/02/19 Medical Record Number:  [id number]   Referring MD: [name], FNP  Reason for Referral: Stroke 2nd opinion  HPI: Christina Silva is a 87 year old pleasant Caucasian lady seen today for initial office consultation visit. She is accompanied by her daughter. History is obtained from them and review of electronic medical records and up reviewed personally pertinent imaging films in PACS. She has past medical history of anxiety, hypertension, hyperlipidemia and gastroesophageal reflux disease. She was admitted to [facility] on 10/18/2018 with sudden onset of left upper extremity weakness and numbness. MRI scan at that time it showed a large 2 cm right basal ganglia infarct and MRI of the brain had shown moderate left P2 stenosis. Echocardiogram was unremarkable. She was started on aspirin and Plavix and will saw Dr. Merlene Laughter. She states she did well from the stroke and made good recovery. 6 months ago she had several episodes of transient left hemifield vision loss along with the dull headache which lasted 15 minutes and she also saw flashing lights. This occurred once daily for 3 days. 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 However MRA of the brain done on 09/01/2020 showed a new right posterior cerebral artery occlusion close to its origin and stable appearance of the moderate left P1/P2 stenosis. Prior carotid ultrasound on 04/22/2020 had shown no significant extracranial stenosis. Patient is not sure when her last lipid profile or A1c was checked. She is on aspirin and Plavix and tolerating it well without major bleeding but does have frequent bruising. On inquiry she admits to a few episodes of transient horizontal diplopia that  she has had in the last few months. This has occurred mostly when she is driving she can see 2 lines instead of one and on one occasion she had to pull off the road and sit in [place] parking lot till this improved. She has been scared to drive since then. She has seen Dr. Merlene Laughter in the past and he had suggested doing a 30-day heart monitor but for unclear reason that this never happened and patient was unhappy and is here to see me for a 2nd opinion. ROS:   14 system review of systems is positive for double vision, blurred vision, vision loss, numbness, weakness, decreased hearing, bruising all other systems negative  PMH:  Past Medical History:  Diagnosis Date  . Anxiety   . Cataract   . GERD (gastroesophageal reflux disease)   . Hyperlipidemia   . Hypertension   . Stroke ([place]) 09/2018  . Vertigo    patient denies    Social History:  Social History   Socioeconomic History  . Marital status: Widowed    Spouse name: Not on file  . Number of children: 2  . Years of education: 10  . Highest education level: Not on file  Occupational History  . Occupation: retired  Tobacco Use  . Smoking status: Former Smoker    Types: Cigarettes    Quit date: 01/09/1983    Years since quitting: 37.8  . Smokeless tobacco: Never Used  Vaping Use  . Vaping Use: Never used  Substance and Sexual Activity  . Alcohol use: No  . Drug use: No  . Sexual activity: Yes  Other  Topics Concern  . Not on file  Social History Narrative   Lives alone   Right Handed   Drinks caffeine occassionally   Social Determinants of Health   Financial Resource Strain: Low Risk   . Difficulty of Paying Living Expenses: Not hard at all  Food Insecurity: No Food Insecurity  . Worried About Charity fundraiser in the Last Year: Never true  . Ran Out of Food in the Last Year: Never true  Transportation Needs: Not on file  Physical Activity: Not on file  Stress: No Stress Concern Present  . Feeling of Stress :  Only a little  Social Connections: Moderately Integrated  . Frequency of Communication with Friends and Family: More than three times a week  . Frequency of Social Gatherings with Friends and Family: More than three times a week  . Attends Religious Services: More than 4 times per year  . Active Member of Clubs or Organizations: Yes  . Attends Archivist Meetings: 1 to 4 times per year  . Marital Status: Widowed  Intimate Partner Violence: Not on file    Medications:   Current Outpatient Medications on File Prior to Visit  Medication Sig Dispense Refill  . amLODipine (NORVASC) 2.5 MG tablet TAKE 1 TABLET DAILY IN THE EVENING 90 tablet 0  . aspirin EC 81 MG tablet Take 81 mg by mouth daily. Swallow whole.    . Brimonidine Tartrate (LUMIFY) 0.025 % SOLN Place 1 drop into both eyes 3 (three) times daily as needed (dry/irritated eyes.).    Marland Kitchen Cholecalciferol (VITAMIN D3) 50 MCG (2000 UT) capsule Take 2,000 Units by mouth daily.    . diazepam (VALIUM) 2 MG tablet TAKE ONE TABLET TWICE DAILY AS NEEDED 60 tablet 2  . losartan (COZAAR) 100 MG tablet Take 1 tablet (100 mg total) by mouth daily. 90 tablet 1  . Omega-3 Fatty Acids (FISH OIL) 1000 MG CAPS Take by mouth.    . pantoprazole (PROTONIX) 40 MG tablet pantoprazole 40 mg tablet,delayed release  Take 1 tablet every day by oral route for 90 days.    . rosuvastatin (CRESTOR) 5 MG tablet TAKE ONE (1) TABLET EACH DAY 90 tablet 3  . topiramate (TOPAMAX) 25 MG tablet Take 25 mg by mouth 2 (two) times daily.     No current facility-administered medications on file prior to visit.    Allergies:   Allergies  Allergen Reactions  . Codeine Nausea And Vomiting  . Lisinopril Swelling  . Morphine Nausea And Vomiting  . Sulfa Antibiotics Nausea Only and Rash  . Sulfonamide Derivatives Nausea Only and Rash    Physical Exam General: Pleasant frail elderly Caucasian lady seated, in no evident distress Head: head normocephalic and  atraumatic.   Neck: supple with soft right greater than left carotid bruits. Cardiovascular: Regular rate and rhythm. No murmur. Musculoskeletal: no deformity Skin:  no rash/petichiae Vascular:  Normal pulses all extremities  Neurologic Exam Mental Status: Awake and fully alert. Oriented to place and time. Recent and remote memory intact. Attention span, concentration and fund of knowledge appropriate. Mood and affect appropriate.  Cranial Nerves: Fundoscopic exam reveals sharp disc margins. Pupils equal, briskly reactive to light. Extraocular movements full without nystagmus. Visual fields full to confrontation. Hearing diminished bilaterally facial sensation intact. Face, tongue, palate moves normally and symmetrically.  Motor: Normal bulk and tone. Normal strength in all tested extremity muscles. Sensory.: intact to touch , pinprick , position and vibratory sensation.  Coordination: Rapid alternating  movements normal in all extremities. Finger-to-nose and heel-to-shin performed accurately bilaterally. Gait and Station: Arises from chair without difficulty. Stance is normal. Gait demonstrates normal stride length and balance . Able to heel, toe and tandem walk with mild difficulty.  Reflexes: 1+ and symmetric. Toes downgoing.   NIHSS  0 Modified Rankin  0   ASSESSMENT: 87 year old Caucasian lady with a large right basal ganglia infarct and February 2020 of cryptogenic etiology with recent episodes of transient left hemifield vision loss 6 months ago possibly complicated migraine versus TIAs. MRI scan of the brain was not done acutely hence small occipital infarct may have been missed. Recent MR angiogram of the brain suggested new right P2 occlusion raising a strong suspicion for paroxysmal A. fib.     PLAN: I had a long discussion with the patient and her daughter regarding her prior history of cryptogenic stroke in February 2020 with more recent episodes of left-sided vision loss and  new right posterior cerebral artery occlusion on MRA making a strong suspicion for paroxysmal A. fib.  I recommend loop recorder insertion for diagnosis of paroxysmal A. fib.  Continue aspirin 81 mg daily alone for stroke prevention and stop Plavix since she is having significant bruising and there is no daytime long-term dual antiplatelet therapy benefit to her stroke prevention.  Maintain aggressive risk factor modification with strict control of hypertension blood pressure goal below 130/90, diabetes with hemoglobin A1c goal below 6.5% and lipids with LDL cholesterol goal below 70 mg percent.  I also increase that she sees see an ophthalmologist to evaluate her intermittent symptoms of horizontal transient diplopia.  Recommend recheck lipid profile, hemoglobin A1c ESR today she will return for follow-up in the future in 3 months or call earlier if necessary. Greater than 50% time during this 45-minute consultation was it was spent on counseling and coordination of care about her remote stroke and recent TIAs and discussion about stroke prevention and treatment and answering questions. Antony Contras, MD  [facility] [address] [place], [place] [address]  Phone [phone] Fax [phone] Note: This document was prepared with digital dictation and possible smart phrase technology. Any transcriptional errors that result from this process are unintentional.

## 2020-10-20 ENCOUNTER — Telehealth: Payer: Self-pay | Admitting: Neurology

## 2020-10-20 LAB — LIPID PANEL
Chol/HDL Ratio: 3.1 ratio (ref 0.0–4.4)
Cholesterol, Total: 185 mg/dL (ref 100–199)
HDL: 59 mg/dL (ref >39)
LDL Chol Calc (NIH): 99 mg/dL (ref 0–99)
Triglycerides: 156 mg/dL — ABNORMAL HIGH (ref 0–149)
VLDL Cholesterol Cal: 27 mg/dL (ref 5–40)

## 2020-10-20 LAB — HEMOGLOBIN A1C
Est. average glucose Bld gHb Est-mCnc: 120 mg/dL
Hgb A1c MFr Bld: 5.8 % — ABNORMAL HIGH (ref 4.8–5.6)

## 2020-10-20 LAB — SEDIMENTATION RATE: Sed Rate: 14 mm/hr (ref 0–40)

## 2020-10-20 NOTE — Telephone Encounter (Signed)
Dr. Leonie Man Patient'  Daughter is calling want's to know if you will place a referral for Dr. Katy Fitch . Ophthalmology ?

## 2020-10-25 NOTE — Telephone Encounter (Signed)
Patient's daughter calling back about referral .

## 2020-10-26 ENCOUNTER — Other Ambulatory Visit: Payer: Self-pay | Admitting: Neurology

## 2020-10-26 DIAGNOSIS — H532 Diplopia: Secondary | ICD-10-CM

## 2020-10-26 NOTE — Telephone Encounter (Signed)
Ok order placed for referral to Dr Katy Fitch for diplopia

## 2020-10-26 NOTE — Progress Notes (Signed)
Kindly inform the patient that cholesterol profile showed slightly elevated bad cholesterol and triglycerides and I recommend increasing the dose of Crestor to 10 mg daily.  Rest of the blood work for screening for diabetes and sed rate were normal

## 2020-10-26 NOTE — Telephone Encounter (Signed)
Thanks Erika Referral has been sent to Dr. Katy Fitch .

## 2020-10-26 NOTE — Telephone Encounter (Signed)
Returned call to daughter [name] regarding information for Opthamalogy referral.  Patient denied further questions, verbalized understanding and expressed appreciation for the phone call.

## 2020-10-28 ENCOUNTER — Encounter: Payer: Self-pay | Admitting: *Deleted

## 2020-11-02 ENCOUNTER — Ambulatory Visit: Payer: Medicare Other | Admitting: Physician Assistant

## 2020-11-02 ENCOUNTER — Encounter: Payer: Self-pay | Admitting: Physician Assistant

## 2020-11-02 ENCOUNTER — Other Ambulatory Visit: Payer: Self-pay

## 2020-11-02 DIAGNOSIS — Z1283 Encounter for screening for malignant neoplasm of skin: Secondary | ICD-10-CM | POA: Diagnosis not present

## 2020-11-02 DIAGNOSIS — D045 Carcinoma in situ of skin of trunk: Secondary | ICD-10-CM | POA: Diagnosis not present

## 2020-11-02 DIAGNOSIS — D0461 Carcinoma in situ of skin of right upper limb, including shoulder: Secondary | ICD-10-CM | POA: Diagnosis not present

## 2020-11-02 DIAGNOSIS — D0439 Carcinoma in situ of skin of other parts of face: Secondary | ICD-10-CM | POA: Diagnosis not present

## 2020-11-02 DIAGNOSIS — D485 Neoplasm of uncertain behavior of skin: Secondary | ICD-10-CM

## 2020-11-02 DIAGNOSIS — C4492 Squamous cell carcinoma of skin, unspecified: Secondary | ICD-10-CM

## 2020-11-02 DIAGNOSIS — Z85828 Personal history of other malignant neoplasm of skin: Secondary | ICD-10-CM | POA: Diagnosis not present

## 2020-11-02 HISTORY — DX: Squamous cell carcinoma of skin, unspecified: C44.92

## 2020-11-02 NOTE — Patient Instructions (Signed)

## 2020-11-08 ENCOUNTER — Other Ambulatory Visit: Payer: Self-pay | Admitting: Nurse Practitioner

## 2020-11-08 DIAGNOSIS — I10 Essential (primary) hypertension: Secondary | ICD-10-CM

## 2020-11-11 ENCOUNTER — Telehealth: Payer: Self-pay

## 2020-11-11 NOTE — Telephone Encounter (Signed)
Phone call to patient with her pathology results. Patient aware of results.  

## 2020-11-11 NOTE — Telephone Encounter (Signed)
-----   Message from Warren Danes, [place] sent at 11/10/2020  4:25 PM EDT ----- Tx with bx.  RTC if recurs

## 2020-11-12 ENCOUNTER — Encounter: Payer: Self-pay | Admitting: Internal Medicine

## 2020-11-12 ENCOUNTER — Ambulatory Visit (INDEPENDENT_AMBULATORY_CARE_PROVIDER_SITE_OTHER): Payer: Medicare Other | Admitting: Internal Medicine

## 2020-11-12 ENCOUNTER — Other Ambulatory Visit: Payer: Self-pay

## 2020-11-12 VITALS — BP 132/74 | HR 77 | Ht 64.0 in | Wt 131.0 lb

## 2020-11-12 DIAGNOSIS — I10 Essential (primary) hypertension: Secondary | ICD-10-CM | POA: Diagnosis not present

## 2020-11-12 DIAGNOSIS — I639 Cerebral infarction, unspecified: Secondary | ICD-10-CM | POA: Diagnosis not present

## 2020-11-12 HISTORY — PX: OTHER SURGICAL HISTORY: SHX169

## 2020-11-12 NOTE — Patient Instructions (Signed)
Medication Instructions:  Your physician recommends that you continue on your current medications as directed. Please refer to the Current Medication list given to you today.  Labwork: None ordered.  Testing/Procedures: None ordered.  Follow-Up:  Your physician wants you to follow-up in: as needed with Dr. Rayann Heman.   Implantable Loop Recorder Placement, Care After This sheet gives you information about how to care for yourself after your procedure. Your health care provider may also give you more specific instructions. If you have problems or questions, contact your health care provider. What can I expect after the procedure? After the procedure, it is common to have:  Soreness or discomfort near the incision.  Some swelling or bruising near the incision.  Follow these instructions at home: Incision care  1.  Leave your outer dressing on for 24 hours.  After 24 hours you can remove your outer dressing and shower. 2. Leave adhesive strips in place. These skin closures may need to stay in place for 1-2 weeks. If adhesive strip edges start to loosen and curl up, you may trim the loose edges.  You may remove the strips if they have not fallen off after 2 weeks. 3. Check your incision area every day for signs of infection. Check for: a. Redness, swelling, or pain. b. Fluid or blood. c. Warmth. d. Pus or a bad smell. 4. Do not take baths, swim, or use a hot tub until your incision is completely healed. 5. If your wound site starts to bleed apply pressure.      If you have any questions/concerns please call the device clinic at [phone].  Activity  Return to your normal activities.  General instructions  Follow instructions from your health care provider about how to manage your implantable loop recorder and transmit the information. Learn how to activate a recording if this is necessary for your type of device.  Do not go through a metal detection gate, and do not let  someone hold a metal detector over your chest. Show your ID card.  Do not have an MRI unless you check with your health care provider first.  Take over-the-counter and prescription medicines only as told by your health care provider.  Keep all follow-up visits as told by your health care provider. This is important. Contact a health care provider if:  You have redness, swelling, or pain around your incision.  You have a fever.  You have pain that is not relieved by your pain medicine.  You have triggered your device because of fainting (syncope) or because of a heartbeat that feels like it is racing, slow, fluttering, or skipping (palpitations). Get help right away if you have:  Chest pain.  Difficulty breathing. Summary  After the procedure, it is common to have soreness or discomfort near the incision.  Change your dressing as told by your health care provider.  Follow instructions from your health care provider about how to manage your implantable loop recorder and transmit the information.  Keep all follow-up visits as told by your health care provider. This is important. This information is not intended to replace advice given to you by your health care provider. Make sure you discuss any questions you have with your health care provider. Document Released: 07/26/2015 Document Revised: 09/29/2017 Document Reviewed: 09/29/2017 Elsevier Patient Education  2020 [employer].

## 2020-11-12 NOTE — Progress Notes (Signed)
Electrophysiology Office Note   Date:  11/12/2020   ID:  Christina Silva, DOB [birth year], MRN [id number]  PCP:  [name], FNP  Cardiologist:  Dr Martinique Primary Electrophysiologist: Thompson Grayer, MD    CC: stroke   History of Present Illness: Christina Silva is a 87 y.o. female who presents today for electrophysiology evaluation.   She is referred by Dr Leonie Man for EP consultation regarding possible arrhythmogenic cause for stroke.  Per Dr Leonie Man: She has past medical history of anxiety, hypertension, hyperlipidemia and gastroesophageal reflux disease. She was admitted to [facility] on 10/18/2018 with sudden onset of left upper extremity weakness and numbness. MRI scan at that time it showed a large 2 cm right basal ganglia infarct and MRI of the brain had shown moderate left P2 stenosis. Echocardiogram was unremarkable. She was started on aspirin and Plavix and will saw Dr. Merlene Laughter. She states she did well from the stroke and made good recovery. 6 months ago she had several episodes of transient left hemifield vision loss along with the dull headache which lasted 15 minutes and she also saw flashing lights. This occurred once daily for 3 days. 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 However MRA of the brain done on 09/01/2020 showed a new right posterior cerebral artery occlusion close to its origin and stable appearance of the moderate left P1/P2 stenosis. Prior carotid ultrasound on 04/22/2020 had shown no significant extracranial stenosis.  There is concerns for possible cardioembolic cause for her stroke.   She has rare dizziness on standing.  She finds that her heart rates are occasionally 130s-150s when she checks her BP.  Today, she denies symptoms of palpitations, chest pain, shortness of breath, orthopnea, PND, lower extremity edema, claudication, syncope,  bleeding, or neurologic sequela. The patient is tolerating medications without difficulties and is otherwise without complaint today.    Past Medical History:  Diagnosis Date  . Anxiety   . Cataract   . GERD (gastroesophageal reflux disease)   . Hyperlipidemia   . Hypertension   . SCCA (squamous cell carcinoma) of skin 11/02/2020   Mid Chest (in situ) (tx p bx)  . SCCA (squamous cell carcinoma) of skin 11/02/2020   Right Forearm Posterior (in situ) (tx p bx)  . SCCA (squamous cell carcinoma) of skin 11/02/2020   Left Submandibular Area (in situ) (tx p bx)  . Stroke ([place]) 09/2018  . Vertigo    patient denies   Past Surgical History:  Procedure Laterality Date  . ABDOMINAL HYSTERECTOMY  1980  . CATARACT EXTRACTION Right   . CHOLECYSTECTOMY N/A 12/16/2019   Procedure: LAPAROSCOPIC CHOLECYSTECTOMY WITH INTRAOPERATIVE CHOLANGIOGRAM;  Surgeon: Erroll Luna, MD;  Location: [place];  Service: General;  Laterality: N/A;  . EYE SURGERY    . KNEE ARTHROSCOPY Left   . ROTATOR CUFF REPAIR Left   . SQUAMOUS CELL CARCINOMA EXCISION Left   . TONSILLECTOMY       Current Outpatient Medications  Medication Sig Dispense Refill  . amLODipine (NORVASC) 2.5 MG tablet TAKE 1 TABLET DAILY IN THE EVENING 90 tablet 0  . aspirin EC 81 MG tablet Take 81 mg by mouth daily. Swallow whole.    . Brimonidine Tartrate (LUMIFY) 0.025 % SOLN Place 1 drop into both eyes 3 (three) times daily as needed (dry/irritated eyes.).    Marland Kitchen Cholecalciferol (VITAMIN D3) 50 MCG (2000 UT) capsule Take  2,000 Units by mouth daily.    . diazepam (VALIUM) 2 MG tablet TAKE ONE TABLET TWICE DAILY AS NEEDED 60 tablet 2  . losartan (COZAAR) 100 MG tablet Take 1 tablet (100 mg total) by mouth daily. (NEEDS TO BE SEEN BEFORE NEXT REFILL) 30 tablet 0  . Omega-3 Fatty Acids (FISH OIL) 1000 MG CAPS Take by mouth.    . pantoprazole (PROTONIX) 40 MG tablet pantoprazole 40 mg tablet,delayed release  Take 1 tablet every day by oral route for  90 days.    . rosuvastatin (CRESTOR) 5 MG tablet TAKE ONE (1) TABLET EACH DAY 90 tablet 3  . topiramate (TOPAMAX) 25 MG tablet Take 25 mg by mouth 2 (two) times daily.     No current facility-administered medications for this visit.    Allergies:   Codeine, Lisinopril, Morphine, Other, Sulfa antibiotics, and Sulfonamide derivatives   Social History:  The patient  reports that she quit smoking about 37 years ago. Her smoking use included cigarettes. She has never used smokeless tobacco. She reports that she does not drink alcohol and does not use drugs.   Family History:  The patient's  family history includes Arthritis in her brother; Epilepsy in her brother; Heart attack in her mother; Heart disease in her mother and sister; Hyperlipidemia in her mother; Psychiatric Illness in her sister; Stroke in her father; Transient ischemic attack in her father.    ROS:  Please see the history of present illness.   All other systems are personally reviewed and negative.    PHYSICAL EXAM: VS:  BP 132/74   Pulse 77   Ht 5\' 4"  (1.626 m)   Wt 131 lb (59.4 kg)   SpO2 93%   BMI 22.49 kg/m  , BMI Body mass index is 22.49 kg/m. GEN: Well nourished, well developed, in no acute distress HEENT: normal Neck: no JVD  Cardiac: RRR  Respiratory:    normal work of breathing GI: soft,  MS: no deformity or atrophy Skin: warm and dry  Neuro:  Strength and sensation are intact Psych: euthymic mood, full affect  EKG:  EKG is ordered today. The ekg ordered today is personally reviewed and shows sinus  Multiple ekgs in epic are reviewed and show only sinus rhythm  Echo 10/18/18- EF 60%  Recent Labs: 09/02/2020: ALT 9; BUN 14; Creatinine, Ser 0.71; Hemoglobin 14.3; Platelets 337; Potassium 4.1; Sodium 141  personally reviewed   Lipid Panel     Component Value Date/Time   CHOL 185 10/19/2020 1427   TRIG 156 (H) 10/19/2020 1427   TRIG 149 02/26/2014 0930   HDL 59 10/19/2020 1427   HDL 68 02/26/2014  0930   CHOLHDL 3.1 10/19/2020 1427   CHOLHDL 3.6 10/18/2018 0625   VLDL 27 10/18/2018 0625   LDLCALC 99 10/19/2020 1427   LDLCALC 144 (H) 02/26/2014 0930   personally reviewed   Wt Readings from Last 3 Encounters:  11/12/20 131 lb (59.4 kg)  10/19/20 132 lb (59.9 kg)  09/02/20 130 lb (59 kg)      Other studies personally reviewed: Additional studies/ records that were reviewed today include: Dr Lenell Antu notes, Dr Morrison Old notes, ecgs, echo  Review of the above records today demonstrates: as above   ASSESSMENT AND PLAN:  Assessment and Plan:  1. Cryptogenic stroke The patient present has had cryptogenic stroke. I agree with Dr Leonie Man that long term monitoring is appropriate to evaluate for AF as the cause.  I spoke at length with the patient  about monitoring for afib with an implantable loop recorder.  Risks, benefits, and alteratives to implantable loop recorder were discussed with the patient today.   At this time, the patient is very clear in their decision to proceed with implantable loop recorder.   2. HTN Stable No change required today  3. HL Follows with Dr Martinique   Maven Varelas MD, [place] 11/12/2020 9:17 AM      PROCEDURES:   1. Implantable loop recorder implantation       DESCRIPTION OF PROCEDURE:  Informed written consent was obtained.  The patient required no sedation for the procedure today.  The patients left chest was prepped and draped. Mapping over the patient's chest was performed to identify the appropriate ILR site.  This area was found to be the left  parasternal region over the 3rd-4th intercostal space.  The skin overlying this region was infiltrated with lidocaine for local analgesia.  A 0.5-cm incision was made at the implant site.  A subcutaneous ILR pocket was fashioned using a combination of sharp and blunt dissection.  A Medtronic Reveal Linq model [id number] implantable loop recorder (SN [id number]) was then placed into the pocket R waves were very  prominent and measured > 0.2 mV. EBL<1 ml.  Steri- Strips and a sterile dressing were then applied.  There were no early apparent complications.     CONCLUSIONS:   1. Successful implantation of a Medtronic Reveal LINQ implantable loop recorder for cryptogenic stroke  2. No early apparent complications.   Thompson Grayer MD, [facility] 11/12/2020 9:30 AM

## 2020-11-15 ENCOUNTER — Encounter: Payer: Self-pay | Admitting: Physician Assistant

## 2020-11-15 NOTE — Progress Notes (Signed)
Follow-Up Visit   Subjective  Christina Silva is a 87 y.o. female who presents for the following: Annual Exam (Patient here today for yearly skin check.  Per patient she has a spot on her left chin x 3-4 months comes and goes. No bleeding, no pain, it does itch at times.).   The following portions of the chart were reviewed this encounter and updated as appropriate:  Tobacco  Allergies  Meds  Problems  Med Hx  Surg Hx  Fam Hx      Objective  Well appearing patient in no apparent distress; mood and affect are within normal limits.  A full examination was performed including scalp, head, eyes, ears, nose, lips, neck, chest, axillae, abdomen, back, buttocks, bilateral upper extremities, bilateral lower extremities, hands, feet, fingers, toes, fingernails, and toenails. All findings within normal limits unless otherwise noted below.  Objective  Left Flank: Full body skin examnination  Objective  Right Breast: Multiple white scars- clear  Left Submandibular Area  Objective  Left Submandibular Area: Pink pearly papule  Images    Objective  Mid Chest: Pink pearly papule     Objective  Right Forearm - Posterior: Pink pearly papule     Assessment & Plan  Encounter for screening for malignant neoplasm of skin Left Flank  Yearly skin check  History of squamous cell carcinoma of skin Right Breast  Yearly skin check  Carcinoma in situ of skin of other part of face Left Submandibular Area  Skin / nail biopsy - Left Submandibular Area Type of biopsy: tangential   Informed consent: discussed and consent obtained   Timeout: patient name, date of birth, surgical site, and procedure verified   Procedure prep:  Patient was prepped and draped in usual sterile fashion (Non sterile) Prep type:  Chlorhexidine Anesthesia: the lesion was anesthetized in a standard fashion   Anesthetic:  1% lidocaine w/ epinephrine 1-100,000 local infiltration Instrument used:  flexible razor blade   Outcome: patient tolerated procedure well   Post-procedure details: wound care instructions given    Destruction of lesion - Left Submandibular Area Complexity: simple   Destruction method: electrodesiccation and curettage   Informed consent: discussed and consent obtained   Timeout:  patient name, date of birth, surgical site, and procedure verified Anesthesia: the lesion was anesthetized in a standard fashion   Curettage performed in three different directions: Yes   Curettage cycles:  1 Margin per side (cm):  0.1 Final wound size (cm):  1 Hemostasis achieved with:  aluminum chloride Outcome: patient tolerated procedure well with no complications   Post-procedure details: wound care instructions given    Specimen 3 - Surgical pathology Differential Diagnosis: bcc vs scc  Check Margins: No  Carcinoma in situ of skin of trunk Mid Chest  Skin / nail biopsy Type of biopsy: tangential   Informed consent: discussed and consent obtained   Timeout: patient name, date of birth, surgical site, and procedure verified   Procedure prep:  Patient was prepped and draped in usual sterile fashion (Non sterile) Prep type:  Chlorhexidine Anesthesia: the lesion was anesthetized in a standard fashion   Anesthetic:  1% lidocaine w/ epinephrine 1-100,000 local infiltration Instrument used: flexible razor blade   Outcome: patient tolerated procedure well   Post-procedure details: wound care instructions given    Destruction of lesion Complexity: simple   Destruction method: electrodesiccation and curettage   Informed consent: discussed and consent obtained   Timeout:  patient name, date of birth, surgical  site, and procedure verified Anesthesia: the lesion was anesthetized in a standard fashion   Anesthetic:  1% lidocaine w/ epinephrine 1-100,000 local infiltration Curettage performed in three different directions: Yes   Curettage cycles:  1 Margin per side (cm):   0.1 Final wound size (cm):  1 Hemostasis achieved with:  aluminum chloride Outcome: patient tolerated procedure well with no complications   Post-procedure details: wound care instructions given    Specimen 1 - Surgical pathology Differential Diagnosis: bcc vs scc  Check Margins: No  Carcinoma in situ of skin of right upper extremity including shoulder Right Forearm - Posterior  Skin / nail biopsy Type of biopsy: tangential   Informed consent: discussed and consent obtained   Timeout: patient name, date of birth, surgical site, and procedure verified   Procedure prep:  Patient was prepped and draped in usual sterile fashion (Non sterile) Prep type:  Chlorhexidine Anesthesia: the lesion was anesthetized in a standard fashion   Anesthetic:  1% lidocaine w/ epinephrine 1-100,000 local infiltration Instrument used: flexible razor blade   Outcome: patient tolerated procedure well   Post-procedure details: wound care instructions given    Destruction of lesion Complexity: simple   Destruction method: electrodesiccation and curettage   Informed consent: discussed and consent obtained   Timeout:  patient name, date of birth, surgical site, and procedure verified Anesthesia: the lesion was anesthetized in a standard fashion   Anesthetic:  1% lidocaine w/ epinephrine 1-100,000 local infiltration Curettage performed in three different directions: Yes   Curettage cycles:  1 Margin per side (cm):  0.1 Final wound size (cm):  1 Hemostasis achieved with:  aluminum chloride Outcome: patient tolerated procedure well with no complications   Post-procedure details: wound care instructions given    Specimen 2 - Surgical pathology Differential Diagnosis: bcc vs scc  Check Margins: No   I, Lether Tesch, PA-C, have reviewed all documentation's for this visit.  The documentation on 11/15/20 for the exam, diagnosis, procedures and orders are all accurate and complete.

## 2020-11-17 DIAGNOSIS — H532 Diplopia: Secondary | ICD-10-CM | POA: Diagnosis not present

## 2020-11-17 DIAGNOSIS — Z961 Presence of intraocular lens: Secondary | ICD-10-CM | POA: Diagnosis not present

## 2020-11-17 DIAGNOSIS — H35373 Puckering of macula, bilateral: Secondary | ICD-10-CM | POA: Diagnosis not present

## 2020-11-19 NOTE — Progress Notes (Signed)
Cardiology Office Note   Date:  11/22/2020   ID:  Christina Silva, DOB Jun 01, 1933, MRN [id number]  PCP:  [name], FNP  Cardiologist:  Peter Martinique, MD EP: None  No chief complaint on file.     History of Present Illness: Christina Silva is a 87 y.o. female with a PMH of HTN, HLD, CVA, GERD, anxiety, and vertigo who presents for follow up.  She was  evaluated by me in 12/2017 to establish care for management of HTN and LE edema. She was recommended to undergo an echocardiogram which showed EF 55-60%, G1DD, mild AI, and mild-moderate MR/TR. In February 2020 she suffered a CVA and was recommended for aspirin and plavix x1 month, then aspirin alone daily. Repeat echo 09/2018 with EF 60-65%, G1DD, basal septal hypertrophy, mild AI, moderate aortic annular calcifications, and mild mitral annular calcifications. She had a TIA 02/2019 and was restarted on plavix.  She was seen in the ED 04/09/2019 with complaints of atypical chest pain with negative troponins and EKG. She was prescribed pantoprazole and recommended to follow-up with cardiology.  Nuclear stress test was done and was normal. She was started on amlodipine for HTN.   She was previously taking lipitor 40 mg for cholesterol but stopped taking it due to myalgias and cramps. She has been on just 5 mg crestor.   She has been diagnosed with gallstones and does have intermittent biliary colic with N/V, chest pain and indigestion. She did undergo a lap choly in April 2021.   6 months ago she had several episodes of transient left hemifield vision loss along with the dull headache which lasted 15 minutes and she also saw flashing lights. This occurred once daily for 3 days. 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 However MRA of the brain done on 09/01/2020 showed a new right posterior cerebral artery occlusion close to its  origin and stable appearance of the moderate left P1/P2 stenosis. Prior carotid ultrasound on 04/22/2020 had shown no significant extracranial stenosis. She is followed by Dr Leonie Man who recommended stopping Plavix.  She was seen by Dr Rayann Heman this month and an implantable loop recorder was placed.   On follow up today she notes periodic elevation of BP. No chest pain or palpitations. Tolerating Crestor well.    Past Medical History:  Diagnosis Date  . Anxiety   . Cataract   . GERD (gastroesophageal reflux disease)   . Hyperlipidemia   . Hypertension   . SCCA (squamous cell carcinoma) of skin 11/02/2020   Mid Chest (in situ) (tx p bx)  . SCCA (squamous cell carcinoma) of skin 11/02/2020   Right Forearm Posterior (in situ) (tx p bx)  . SCCA (squamous cell carcinoma) of skin 11/02/2020   Left Submandibular Area (in situ) (tx p bx)  . Stroke ([place]) 09/2018  . Vertigo    patient denies    Past Surgical History:  Procedure Laterality Date  . ABDOMINAL HYSTERECTOMY  1980  . CATARACT EXTRACTION Right   . CHOLECYSTECTOMY N/A 12/16/2019   Procedure: LAPAROSCOPIC CHOLECYSTECTOMY WITH INTRAOPERATIVE CHOLANGIOGRAM;  Surgeon: Erroll Luna, MD;  Location: [place];  Service: General;  Laterality: N/A;  . EYE SURGERY    . implantable loop recorder placement  11/12/2020    Medtronic Reveal Linq model [id number] implantable loop recorder (SN [id number]) implanted by Dr Rayann Heman for cryptogenic stroke  . KNEE ARTHROSCOPY Left   .  ROTATOR CUFF REPAIR Left   . SQUAMOUS CELL CARCINOMA EXCISION Left   . TONSILLECTOMY       Current Outpatient Medications  Medication Sig Dispense Refill  . amLODipine (NORVASC) 2.5 MG tablet TAKE 1 TABLET DAILY IN THE EVENING 90 tablet 0  . aspirin EC 81 MG tablet Take 81 mg by mouth daily. Swallow whole.    . Cholecalciferol (VITAMIN D3) 50 MCG (2000 UT) capsule Take 2,000 Units by mouth daily.    . diazepam (VALIUM) 2 MG tablet TAKE ONE TABLET TWICE DAILY AS NEEDED 60  tablet 2  . losartan (COZAAR) 100 MG tablet Take 1 tablet (100 mg total) by mouth daily. (NEEDS TO BE SEEN BEFORE NEXT REFILL) 30 tablet 0  . pantoprazole (PROTONIX) 40 MG tablet pantoprazole 40 mg tablet,delayed release  Take 1 tablet every day by oral route for 90 days.    . rosuvastatin (CRESTOR) 20 MG tablet Take 1 tablet (20 mg total) by mouth daily. 90 tablet 3   No current facility-administered medications for this visit.    Allergies:   Codeine, Lisinopril, Morphine, Other, Sulfa antibiotics, and Sulfonamide derivatives    Social History:  The patient  reports that she quit smoking about 37 years ago. Her smoking use included cigarettes. She has never used smokeless tobacco. She reports that she does not drink alcohol and does not use drugs.   Family History:  The patient's family history includes Arthritis in her brother; Epilepsy in her brother; Heart attack in her mother; Heart disease in her mother and sister; Hyperlipidemia in her mother; Psychiatric Illness in her sister; Stroke in her father; Transient ischemic attack in her father.    ROS:  Please see the history of present illness.   Otherwise, review of systems are positive for none.   All other systems are reviewed and negative.    PHYSICAL EXAM: VS:  BP (!) 164/84 (BP Location: Left Arm, Patient Position: Sitting)   Pulse 79   Ht 5\' 4"  (1.626 m)   Wt 132 lb 9.6 oz (60.1 kg)   SpO2 94%   BMI 22.76 kg/m  , BMI Body mass index is 22.76 kg/m. GEN: Well nourished, well developed, in no acute distress HEENT: sclera anicteric Neck: no JVD, carotid bruits, or masses Cardiac: RRR; no murmurs, rubs, or gallops, no edema  Respiratory:  clear to auscultation bilaterally, normal work of breathing GI: soft, nontender, nondistended, + BS MS: no deformity or atrophy Skin: warm and dry, no rash Neuro:  Strength and sensation are intact Psych: euthymic mood, full affect   EKG:  EKG is not ordered today.  Recent  Labs: 09/02/2020: ALT 9; BUN 14; Creatinine, Ser 0.71; Hemoglobin 14.3; Platelets 337; Potassium 4.1; Sodium 141    Lipid Panel Lab Results  Component Value Date   CHOL 185 10/19/2020   CHOL 203 (H) 03/23/2020   CHOL 246 (H) 09/24/2019   Lab Results  Component Value Date   HDL 59 10/19/2020   HDL 68 03/23/2020   HDL 62 09/24/2019   Lab Results  Component Value Date   LDLCALC 99 10/19/2020   LDLCALC 111 (H) 03/23/2020   LDLCALC 153 (H) 09/24/2019   Lab Results  Component Value Date   TRIG 156 (H) 10/19/2020   TRIG 137 03/23/2020   TRIG 171 (H) 09/24/2019   Lab Results  Component Value Date   CHOLHDL 3.1 10/19/2020   CHOLHDL 3.0 03/23/2020   CHOLHDL 4.0 09/24/2019   No results found for: LDLDIRECT  Wt Readings from Last 3 Encounters:  11/22/20 132 lb 9.6 oz (60.1 kg)  11/12/20 131 lb (59.4 kg)  10/19/20 132 lb (59.9 kg)      Other studies Reviewed: Additional studies/ records that were reviewed today include:   Echocardiogram 09/2018: IMPRESSIONS    1. The left ventricle has normal systolic function with an ejection fraction of 60-65%. The cavity size was normal. There is basal septal hypertophy. Left ventricular diastolic Doppler parameters are consistent with impaired relaxation.  2. The right ventricle has normal systolic function. The cavity was normal. There is no increase in right ventricular wall thickness.  3. The aortic valve is tricuspid Moderate thickening of the aortic valve Moderate calcification of the aortic valve. Aortic valve regurgitation is mild by color flow Doppler.. Moderate aortic annular calcification noted.  4. The mitral valve is normal in structure. Mild thickening of the mitral valve leaflet. Mild calcification of the mitral valve leaflet. There is mild mitral annular calcification present. No evidence of mitral valve stenosis.  5. The tricuspid valve is normal in structure.  6. The pulmonic valve was grossly normal. Pulmonic valve  regurgitation is mild by color flow Doppler.  7. The aortic root is normal in size and structure.  8. Pulmonary hypertension is indeterminat,inadequate TR jet.   Myoview 05/14/19: Study Highlights    The left ventricular ejection fraction is hyperdynamic (>65%).  Nuclear stress EF: 66%.  No T wave inversion was noted during stress.  There was no ST segment deviation noted during stress.  The study is normal.  This is a low risk study.   Normal perfusion. LVEF 66% with normal wall motion. This is a low risk study.     ASSESSMENT AND PLAN:  1. Chest pain:  Due to biliary colic. S/p lap choly. No recurrence.  2. HTN: labile - Continue losartan and amlodipine - given instructions to take an extra amlodipine if BP > 180/90.  3. HLD: previously well controlled on lipitor but stopped due to myalgias. With prior CVA and TIA goal LDL < 70. Recommend she increase Crestor to 20 mg daily. Will repeat fasting lab in 3 months.  4. CVA/TIA: diagnosed with a CVA 09/2018 with TIA 02/2019. Recent posterior cerebral artery occlusion. - Continue aspirin 81mg  daily - no longer on Plavix per Dr Leonie Man. - loop recorder in place now.   Current medicines are reviewed at length with the patient today.  The patient does not have concerns regarding medicines.  The following changes have been made:  none  Labs/ tests ordered today include:   No orders of the defined types were placed in this encounter.    Disposition:   FU in 6 months.  Signed, Peter Martinique, MD  11/22/2020 4:47 PM

## 2020-11-22 ENCOUNTER — Encounter: Payer: Self-pay | Admitting: Cardiology

## 2020-11-22 ENCOUNTER — Other Ambulatory Visit: Payer: Self-pay

## 2020-11-22 ENCOUNTER — Ambulatory Visit (INDEPENDENT_AMBULATORY_CARE_PROVIDER_SITE_OTHER): Payer: Medicare Other | Admitting: Cardiology

## 2020-11-22 VITALS — BP 164/84 | HR 79 | Ht 64.0 in | Wt 132.6 lb

## 2020-11-22 DIAGNOSIS — I639 Cerebral infarction, unspecified: Secondary | ICD-10-CM

## 2020-11-22 DIAGNOSIS — E785 Hyperlipidemia, unspecified: Secondary | ICD-10-CM

## 2020-11-22 DIAGNOSIS — I10 Essential (primary) hypertension: Secondary | ICD-10-CM | POA: Diagnosis not present

## 2020-11-22 MED ORDER — ROSUVASTATIN CALCIUM 20 MG PO TABS: 20.0000 mg | ORAL_TABLET | Freq: Every day | ORAL | 3 refills | Status: DC

## 2020-11-22 NOTE — Patient Instructions (Signed)
Increase Crestor to 20 mg daily. We will repeat lab work in 3 months  If your blood pressure goes over 103 systolic or 90 diastolic you may take an extra 2.5 mg of amlodipine

## 2020-12-13 ENCOUNTER — Other Ambulatory Visit: Payer: Self-pay | Admitting: Nurse Practitioner

## 2020-12-13 DIAGNOSIS — F411 Generalized anxiety disorder: Secondary | ICD-10-CM

## 2020-12-13 DIAGNOSIS — I10 Essential (primary) hypertension: Secondary | ICD-10-CM

## 2020-12-16 ENCOUNTER — Telehealth: Payer: Self-pay

## 2020-12-20 ENCOUNTER — Other Ambulatory Visit: Payer: Self-pay | Admitting: Nurse Practitioner

## 2020-12-20 ENCOUNTER — Ambulatory Visit (INDEPENDENT_AMBULATORY_CARE_PROVIDER_SITE_OTHER): Payer: Medicare Other

## 2020-12-20 DIAGNOSIS — I639 Cerebral infarction, unspecified: Secondary | ICD-10-CM

## 2020-12-20 DIAGNOSIS — F411 Generalized anxiety disorder: Secondary | ICD-10-CM

## 2020-12-20 MED ORDER — DIAZEPAM 2 MG PO TABS: ORAL_TABLET | ORAL | 2 refills | Status: DC

## 2020-12-21 LAB — CUP PACEART REMOTE DEVICE CHECK
Date Time Interrogation Session: 20220424132940
Implantable Pulse Generator Implant Date: 20220322

## 2020-12-24 DIAGNOSIS — M5459 Other low back pain: Secondary | ICD-10-CM | POA: Diagnosis not present

## 2020-12-24 DIAGNOSIS — M7061 Trochanteric bursitis, right hip: Secondary | ICD-10-CM | POA: Diagnosis not present

## 2021-01-07 NOTE — Progress Notes (Signed)
Carelink Summary Report / Loop Recorder 

## 2021-01-12 ENCOUNTER — Telehealth: Payer: Self-pay

## 2021-01-12 NOTE — Telephone Encounter (Signed)
The patient states she had some hurting in her chest and down her arm. She took some Tums and valium then the hurt went away. Monday she had the same kind of pains. She thinks she may just had indigestion. She wants to make sure nothing is on her loop recorder.

## 2021-01-12 NOTE — Telephone Encounter (Signed)
Patient reports CP on 2 occasions that woke her from sleeping. She reports the CP was substernal radiating into her left arm and lasted 10-20 minutes. Patient reports no SOB,  N,V, or diaphoresis with CP. She reports increased " indigestion " x 2 weeks. LINQ II loop recorder transmission reviewed that showed no episodes or events, programmed with  AF detection on for Cryptogenic stroke. ED precautions given for CP, SOB, dizziness, chest pressure or syncope.  Will forward to Dr Martinique.

## 2021-01-13 NOTE — Telephone Encounter (Signed)
OK let us know if chest pain recurs/persists.  Josyah Achor Martinique MD, [facility]

## 2021-01-14 NOTE — Telephone Encounter (Signed)
Spoke to patient Dr.Jordan's advice given.Stated she has not had any chest pressure since this past Monday.Advised to call back if she has any more.

## 2021-01-24 LAB — CUP PACEART REMOTE DEVICE CHECK
Date Time Interrogation Session: 20220527133106
Implantable Pulse Generator Implant Date: 20220322

## 2021-01-25 ENCOUNTER — Ambulatory Visit (INDEPENDENT_AMBULATORY_CARE_PROVIDER_SITE_OTHER): Payer: Medicare Other

## 2021-01-25 DIAGNOSIS — I639 Cerebral infarction, unspecified: Secondary | ICD-10-CM

## 2021-01-26 ENCOUNTER — Other Ambulatory Visit: Payer: Self-pay | Admitting: Nurse Practitioner

## 2021-01-26 DIAGNOSIS — I10 Essential (primary) hypertension: Secondary | ICD-10-CM

## 2021-01-26 NOTE — Telephone Encounter (Signed)
MMM NTBS 30 days given March

## 2021-01-27 ENCOUNTER — Telehealth: Payer: Self-pay | Admitting: Cardiology

## 2021-01-27 MED ORDER — ROSUVASTATIN CALCIUM 5 MG PO TABS: 5.0000 mg | ORAL_TABLET | Freq: Every day | ORAL | 3 refills | Status: DC

## 2021-01-27 NOTE — Telephone Encounter (Signed)
She should resume Crestor 5 mg daily since she tolerated this before.  Garrit Marrow Martinique MD, [facility]

## 2021-01-27 NOTE — Telephone Encounter (Signed)
Pt c/o medication issue: 1. Name of Medication: Rosuvastatin  2. How are you currently taking this medication (dosage and times per day)? 1 tap a day 3. Are you having a reaction (difficulty breathing--STAT)?  No  4. What is your medication issue? Patient called to let the doctor know she has stop taken this medication making her legs cramp

## 2021-01-27 NOTE — Telephone Encounter (Signed)
This RN returned patient's call, patient reports she was originally taking rosuvastatin 5mg , which she tolerated well, however on 11/22/2020 her dosage was increased to 20mg  and she reports she started having leg cramping. She reports she has not taken the rosuvastatin 20mg  in "about a month". She reports her leg cramps are now gone and denies any other symptoms at this time. This RN let the patient know her concerns would be forwarded to Dr. Martinique and his primary nurse for review.

## 2021-01-27 NOTE — Telephone Encounter (Signed)
Spoke to patient Dr.Jordan's advice given.Prescription sent to pharmacy.

## 2021-02-07 ENCOUNTER — Telehealth: Payer: Self-pay | Admitting: Cardiology

## 2021-02-07 NOTE — Telephone Encounter (Signed)
This RN called patient back, patient was wondering if she could get the labs Dr. Martinique wanted drawn while she is at her PCP visit this Wednesday. This RN let patient know that Dr. Martinique had ordered a Basic Metabolic Panel, Hepatic function panel, and a lipid panel during her previous visit. This RN let patient know if the PCP draws these they may be faxed to our office, and if they are not planning on drawing these labs she may come into the office to have them drawn. Pt verbalized understanding.

## 2021-02-07 NOTE — Telephone Encounter (Signed)
Christina Silva is calling stating she is going to her PCP in the morning for an appt and they will be drawing labs. She wants to know if she is still required to get labs with our office due to this. Please advise.

## 2021-02-08 ENCOUNTER — Ambulatory Visit: Payer: Medicare Other | Admitting: Neurology

## 2021-02-08 ENCOUNTER — Other Ambulatory Visit: Payer: Self-pay

## 2021-02-08 ENCOUNTER — Telehealth: Payer: Self-pay | Admitting: Neurology

## 2021-02-08 ENCOUNTER — Encounter: Payer: Self-pay | Admitting: Neurology

## 2021-02-08 VITALS — BP 152/74 | HR 78 | Ht 64.0 in | Wt 131.4 lb

## 2021-02-08 DIAGNOSIS — H539 Unspecified visual disturbance: Secondary | ICD-10-CM

## 2021-02-08 DIAGNOSIS — G43009 Migraine without aura, not intractable, without status migrainosus: Secondary | ICD-10-CM

## 2021-02-08 DIAGNOSIS — M791 Myalgia, unspecified site: Secondary | ICD-10-CM | POA: Diagnosis not present

## 2021-02-08 MED ORDER — COQ10 200 MG PO CAPS: 1.0000 | ORAL_CAPSULE | ORAL | 1 refills | Status: AC

## 2021-02-08 MED ORDER — TOPIRAMATE 25 MG PO TABS: 25.0000 mg | ORAL_TABLET | Freq: Two times a day (BID) | ORAL | 3 refills | Status: DC

## 2021-02-08 NOTE — Patient Instructions (Signed)
I had a long discussion with the patient and her daughter regarding her recent episode of transient vision disturbance followed by a mild headache likely representing a typical migraines.  I recommend she continue Topamax 25 mg twice daily for migraine prevention and try to avoid triggers if possible.  She will stay on aspirin for stroke prevention and maintain aggressive risk factor modification with strict control of hypertension with blood pressure goal below 130/90, lipids with LDL cholesterol goal below 70 mg percent.  I recommend she start taking co-Q10 200 mg daily to reduce statin myalgia and increase Crestor dose to 10 mg daily if tolerated.  If she has trouble tolerating this may need to switch her to the PCSK9 inhibitor injections like Repatha or Praluent in the future.  She will return for follow-up in 3 months with my nurse practitioner Janett Billow or call earlier if necessary.

## 2021-02-08 NOTE — Progress Notes (Signed)
[facility] [address] [place]. [place] [address] [phone]       OFFICE FOLLOW UP VISIT NOTE  [name] Date of Birth:  1932/10/04 Medical Record Number:  [id number]   Referring MD: [name], FNP  Reason for Referral: Stroke 2nd opinion  HPI: Initial visit 10/19/2020: Christina Silva is a 87 year old pleasant Caucasian lady seen today for initial office consultation visit. She is accompanied by her daughter. History is obtained from them and review of electronic medical records and up reviewed personally pertinent imaging films in PACS. She has past medical history of anxiety, hypertension, hyperlipidemia and gastroesophageal reflux disease. She was admitted to [facility] on 10/18/2018 with sudden onset of left upper extremity weakness and numbness. MRI scan at that time it showed a large 2 cm right basal ganglia infarct and MRI of the brain had shown moderate left P2 stenosis. Echocardiogram was unremarkable. She was started on aspirin and Plavix and will saw Dr. Merlene Silva. She states she did well from the stroke and made good recovery. 6 months ago she had several episodes of transient left hemifield vision loss along with the dull headache which lasted 15 minutes and she also saw flashing lights. This occurred once daily for 3 days. 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 However MRA of the brain done on 09/01/2020 showed a new right posterior cerebral artery occlusion close to its origin and stable appearance of the moderate left P1/P2 stenosis. Prior carotid ultrasound on 04/22/2020 had shown no significant extracranial stenosis. Patient is not sure when her last lipid profile or A1c was checked. She is on aspirin and Plavix and tolerating it well without major bleeding but does have frequent bruising. On inquiry she admits to a few episodes of  transient horizontal diplopia that she has had in the last few months. This has occurred mostly when she is driving she can see 2 lines instead of one and on one occasion she had to pull off the road and sit in [place] parking lot till this improved. She has been scared to drive since then. She has seen Dr. Merlene Silva in the past and he had suggested doing a 30-day heart monitor but for unclear reason that this never happened and patient was unhappy and is here to see me for a 2nd opinion. Update 02/08/2021 ; she returns for follow-up after last visit on 10/19/2020.  She is accompanied by her daughter Christina Silva.  Patient states she has had 3 episodes of brief visual disturbance followed by mild headache on May 9, May 10 and June 11.  They were all stereotypical and similar.  Whole episode lasted 10 to 15 minutes.  She just noticed some visual disturbance in the left eye which moved across to the right eye.  She could still see.  She had a mild dull headache later.  There were no clear triggers or precipitating factors that she could identify.  Patient had lab work at last visit on 10/19/2020 which showed ESR of 14 mm.  LDL cholesterol 111 mg percent and triglycerides 156.  A hemoglobin A1c was 5.8.  I recommended we increase the dose of Crestor to 10 mg but patient's primary physician increased to 20 mg which she did not tolerate and developed muscle aches and pains and it was discontinued.  She has been since restarted on 5 mg of Crestor which she  is so far tolerating well.  She was also started on Topamax by Dr. Merlene Silva neurologist but she is taking it only as as needed.  She is tolerating aspirin well without any bruising or bleeding.  Blood pressure is usually well controlled today slightly elevated in office at 152/74.  She has no new complaints.  She had outpatient loop recorder placed by Dr. Rayann Heman and so for paroxysmal A. fib has not yet been found. ROS:   14 system review of systems is positive for double  vision, blurred vision, vision loss, numbness, weakness, decreased hearing, bruising all other systems negative  PMH:  Past Medical History:  Diagnosis Date   Anxiety    Cataract    GERD (gastroesophageal reflux disease)    Hyperlipidemia    Hypertension    SCCA (squamous cell carcinoma) of skin 11/02/2020   Mid Chest (in situ) (tx p bx)   SCCA (squamous cell carcinoma) of skin 11/02/2020   Right Forearm Posterior (in situ) (tx p bx)   SCCA (squamous cell carcinoma) of skin 11/02/2020   Left Submandibular Area (in situ) (tx p bx)   Stroke ([place]) 09/2018   Vertigo    patient denies    Social History:  Social History   Socioeconomic History   Marital status: Widowed    Spouse name: Not on file   Number of children: 2   Years of education: 10   Highest education level: Not on file  Occupational History   Occupation: retired  Tobacco Use   Smoking status: Former    Pack years: 0.00    Types: Cigarettes    Quit date: 01/09/1983    Years since quitting: 38.1   Smokeless tobacco: Never  Vaping Use   Vaping Use: Never used  Substance and Sexual Activity   Alcohol use: No   Drug use: No   Sexual activity: Yes  Other Topics Concern   Not on file  Social History Narrative   Lives alone   Right Handed   Drinks caffeine occassionally   Social Determinants of Health   Financial Resource Strain: Low Risk    Difficulty of Paying Living Expenses: Not hard at all  Food Insecurity: No Food Insecurity   Worried About Charity fundraiser in the Last Year: Never true   Ran Out of Food in the Last Year: Never true  Transportation Needs: Not on file  Physical Activity: Not on file  Stress: No Stress Concern Present   Feeling of Stress : Only a little  Social Connections: Moderately Integrated   Frequency of Communication with Friends and Family: More than three times a week   Frequency of Social Gatherings with Friends and Family: More than three times a week   Attends  Religious Services: More than 4 times per year   Active Member of [employer] or Organizations: Yes   Attends Archivist Meetings: 1 to 4 times per year   Marital Status: Widowed  Human resources officer Violence: Not on file    Medications:   Current Outpatient Medications on File Prior to Visit  Medication Sig Dispense Refill   amLODipine (NORVASC) 2.5 MG tablet TAKE 1 TABLET DAILY IN THE EVENING 90 tablet 0   aspirin EC 81 MG tablet Take 81 mg by mouth daily. Swallow whole.     cholecalciferol (VITAMIN D) 25 MCG (1000 UNIT) tablet Take 2,000 Units by mouth daily.     diazepam (VALIUM) 2 MG tablet TAKE ONE TABLET TWICE DAILY AS NEEDED  60 tablet 2   losartan (COZAAR) 100 MG tablet Take 1 tablet (100 mg total) by mouth daily. (NEEDS TO BE SEEN BEFORE NEXT REFILL) 30 tablet 0   omega-3 acid ethyl esters (LOVAZA) 1 g capsule Take by mouth 2 (two) times daily.     pantoprazole (PROTONIX) 40 MG tablet pantoprazole 40 mg tablet,delayed release  Take 1 tablet every day by oral route for 90 days.     rosuvastatin (CRESTOR) 5 MG tablet Take 1 tablet (5 mg total) by mouth daily. 90 tablet 3   vitamin C (ASCORBIC ACID) 500 MG tablet Take 500 mg by mouth daily.     No current facility-administered medications on file prior to visit.    Allergies:   Allergies  Allergen Reactions   Codeine Nausea And Vomiting   Lisinopril Swelling   Morphine Nausea And Vomiting   Other    Sulfa Antibiotics Nausea Only and Rash   Sulfonamide Derivatives Nausea Only and Rash    Physical Exam General: Pleasant frail elderly Caucasian lady seated, in no evident distress Head: head normocephalic and atraumatic.   Neck: supple with soft right greater than left carotid bruits. Cardiovascular: Regular rate and rhythm. No murmur. Musculoskeletal: no deformity Skin:  no rash/petichiae Vascular:  Normal pulses all extremities  Neurologic Exam Mental Status: Awake and fully alert. Oriented to place and time. Recent  and remote memory intact. Attention span, concentration and fund of knowledge appropriate. Mood and affect appropriate.  Cranial Nerves: Fundoscopic exam not done. Pupils equal, briskly reactive to light. Extraocular movements full without nystagmus. Visual fields full to confrontation. Hearing diminished bilaterally facial sensation intact. Face, tongue, palate moves normally and symmetrically.  Motor: Normal bulk and tone. Normal strength in all tested extremity muscles. Sensory.: intact to touch , pinprick , position and vibratory sensation.  Coordination: Rapid alternating movements normal in all extremities. Finger-to-nose and heel-to-shin performed accurately bilaterally. Gait and Station: Arises from chair without difficulty. Stance is normal. Gait demonstrates normal stride length and balance . Able to heel, toe and tandem walk with mild difficulty.  Reflexes: 1+ and symmetric. Toes downgoing.       ASSESSMENT: 87 year old Caucasian lady with a large right basal ganglia infarct and February 2020 of cryptogenic etiology with recurrent  episodes of transient visual disturbances followed by mild headache likely atypical  migraine rather than TIAs.       PLAN: I had a long discussion with the patient and her daughter regarding her recent episode of transient vision disturbance followed by a mild headache likely representing a typical migraines.  I recommend she continue Topamax 25 mg twice daily for migraine prevention and try to avoid triggers if possible.  She will stay on aspirin for stroke prevention and maintain aggressive risk factor modification with strict control of hypertension with blood pressure goal below 130/90, lipids with LDL cholesterol goal below 70 mg percent.  I recommend she start taking co-Q10 200 mg daily to reduce statin myalgia and increase Crestor dose to 10 mg daily if tolerated.  If she has trouble tolerating this may need to switch her to the PCSK9 inhibitor  injections like Repatha or Praluent in the future.  She will return for follow-up in 3 months with my nurse practitioner Janett Billow or call earlier if necessary.Greater than 50% time during this 35-minute visit was  spent on counseling and coordination of care about her remote stroke and atypical migraines and discussion about stroke prevention and treatment and answering questions. Antony Contras, MD  [facility] [address] [place], [place] [address]  Phone [phone] Fax [phone] Note: This document was prepared with digital dictation and possible smart phrase technology. Any transcriptional errors that result from this process are unintentional.

## 2021-02-08 NOTE — Telephone Encounter (Signed)
[facility]) called, verify dosage increase . Need new prescription. rosuvastatin (CRESTOR) 5 MG tablet.

## 2021-02-09 ENCOUNTER — Ambulatory Visit (INDEPENDENT_AMBULATORY_CARE_PROVIDER_SITE_OTHER): Payer: Medicare Other | Admitting: Nurse Practitioner

## 2021-02-09 ENCOUNTER — Other Ambulatory Visit: Payer: Self-pay

## 2021-02-09 ENCOUNTER — Encounter: Payer: Self-pay | Admitting: Nurse Practitioner

## 2021-02-09 VITALS — BP 120/71 | HR 73 | Temp 97.8°F | Resp 20 | Ht 64.0 in | Wt 128.0 lb

## 2021-02-09 DIAGNOSIS — E785 Hyperlipidemia, unspecified: Secondary | ICD-10-CM

## 2021-02-09 DIAGNOSIS — R739 Hyperglycemia, unspecified: Secondary | ICD-10-CM | POA: Diagnosis not present

## 2021-02-09 DIAGNOSIS — I6930 Unspecified sequelae of cerebral infarction: Secondary | ICD-10-CM | POA: Diagnosis not present

## 2021-02-09 DIAGNOSIS — G43009 Migraine without aura, not intractable, without status migrainosus: Secondary | ICD-10-CM | POA: Insufficient documentation

## 2021-02-09 DIAGNOSIS — M8588 Other specified disorders of bone density and structure, other site: Secondary | ICD-10-CM

## 2021-02-09 DIAGNOSIS — K219 Gastro-esophageal reflux disease without esophagitis: Secondary | ICD-10-CM

## 2021-02-09 DIAGNOSIS — I10 Essential (primary) hypertension: Secondary | ICD-10-CM

## 2021-02-09 DIAGNOSIS — F411 Generalized anxiety disorder: Secondary | ICD-10-CM

## 2021-02-09 LAB — BAYER DCA HB A1C WAIVED: HB A1C (BAYER DCA - WAIVED): 5.6 % (ref <7.0)

## 2021-02-09 MED ORDER — TOPIRAMATE 25 MG PO TABS: 25.0000 mg | ORAL_TABLET | Freq: Two times a day (BID) | ORAL | 3 refills | Status: DC

## 2021-02-09 MED ORDER — AMLODIPINE BESYLATE 2.5 MG PO TABS: 2.5000 mg | ORAL_TABLET | Freq: Every evening | ORAL | 1 refills | Status: DC

## 2021-02-09 MED ORDER — PANTOPRAZOLE SODIUM 40 MG PO TBEC
40.0000 mg | DELAYED_RELEASE_TABLET | Freq: Every day | ORAL | 1 refills | Status: DC
Start: 2021-02-09 — End: 2021-08-30

## 2021-02-09 MED ORDER — ROSUVASTATIN CALCIUM 10 MG PO TABS: 10.0000 mg | ORAL_TABLET | Freq: Every day | ORAL | 1 refills | Status: DC

## 2021-02-09 MED ORDER — DIAZEPAM 2 MG PO TABS: ORAL_TABLET | ORAL | 2 refills | Status: DC

## 2021-02-09 NOTE — Progress Notes (Signed)
Subjective:    Patient ID: Christina Silva, female    DOB: 12/26/1932, 87 y.o.   MRN: [id number]   Chief Complaint: medical management of chronic issues     HPI:  1. Primary hypertension No c/o chest pain, sob or headache. Does not check blood pressure at home. BP Readings from Last 3 Encounters:  02/08/21 (!) 152/74  11/22/20 (!) 164/84  11/12/20 132/74     2. Hyperlipidemia with target LDL less than 100 Does not watch diet. Does no dedicated exercise, but tries to stay very active. Lab Results  Component Value Date   CHOL 185 10/19/2020   HDL 59 10/19/2020   LDLCALC 99 10/19/2020   TRIG 156 (H) 10/19/2020   CHOLHDL 3.1 10/19/2020     3. Hyperglycemia Blood sugars have been elevated in the past. She does not check her blood sugars at home. Lab Results  Component Value Date   HGBA1C 5.8 (H) 10/19/2020      4. Gastroesophageal reflux disease without esophagitis Is on protonix daily and is doing well.  5. Osteopenia of spine Last dexascan was done 05/18/15. She no longer wants to do anymore dexascans.  6. Late effect of cerebrovascular accident (CVA) She has no residual effects. Saw neurologist yesterday  7. Anxiety state Is on valium 63m and only takes 1-2 x a week.    Outpatient Encounter Medications as of 02/09/2021  Medication Sig  . amLODipine (NORVASC) 2.5 MG tablet TAKE 1 TABLET DAILY IN THE EVENING  . aspirin EC 81 MG tablet Take 81 mg by mouth daily. Swallow whole.  . cholecalciferol (VITAMIN D) 25 MCG (1000 UNIT) tablet Take 2,000 Units by mouth daily.  . Coenzyme Q10 (COQ10) 200 MG CAPS Take 1 capsule by mouth 1 day or 1 dose for 1 dose.  . diazepam (VALIUM) 2 MG tablet TAKE ONE TABLET TWICE DAILY AS NEEDED  . losartan (COZAAR) 100 MG tablet Take 1 tablet (100 mg total) by mouth daily. (NEEDS TO BE SEEN BEFORE NEXT REFILL)  . omega-3 acid ethyl esters (LOVAZA) 1 g capsule Take by mouth 2 (two) times daily.  . pantoprazole (PROTONIX) 40 MG  tablet pantoprazole 40 mg tablet,delayed release  Take 1 tablet every day by oral route for 90 days.  . rosuvastatin (CRESTOR) 5 MG tablet Take 1 tablet (5 mg total) by mouth daily.  .Marland Kitchentopiramate (TOPAMAX) 25 MG tablet Take 1 tablet (25 mg total) by mouth 2 (two) times daily.  . vitamin C (ASCORBIC ACID) 500 MG tablet Take 500 mg by mouth daily.   No facility-administered encounter medications on file as of 02/09/2021.    Past Surgical History:  Procedure Laterality Date  . ABDOMINAL HYSTERECTOMY  1980  . CATARACT EXTRACTION Right   . CHOLECYSTECTOMY N/A 12/16/2019   Procedure: LAPAROSCOPIC CHOLECYSTECTOMY WITH INTRAOPERATIVE CHOLANGIOGRAM;  Surgeon: CErroll Luna MD;  [place] [facility]  Service: General;  Laterality: N/A;  . EYE SURGERY    . implantable loop recorder placement  11/12/2020    Medtronic Reveal Linq model [id number] loop recorder (SN [id number]) implanted by Dr ARayann Hemanfor cryptogenic stroke  . KNEE ARTHROSCOPY Left   . ROTATOR CUFF REPAIR Left   . SQUAMOUS CELL CARCINOMA EXCISION Left   . TONSILLECTOMY      Family History  Problem Relation Age of Onset  . Heart attack Mother   . Hyperlipidemia Mother   . Heart disease Mother   . Transient ischemic attack Father   . Stroke Father   .  Psychiatric Illness Sister   . Heart disease Sister   . Arthritis Brother   . Epilepsy Brother   . Colon cancer Neg Hx   . Esophageal cancer Neg Hx   . Rectal cancer Neg Hx   . Stomach cancer Neg Hx     New complaints: None today  Social history: Lives by herself. Her daughter check so her daily.  Controlled substance contract: 02/09/21     Review of Systems  Constitutional:  Negative for diaphoresis.  Eyes:  Negative for pain.  Respiratory:  Negative for shortness of breath.   Cardiovascular:  Negative for chest pain, palpitations and leg swelling.  Gastrointestinal:  Negative for abdominal pain.  Endocrine: Negative for polydipsia.  Skin:  Negative for  rash.  Neurological:  Negative for dizziness, weakness and headaches.  Hematological:  Does not bruise/bleed easily.  All other systems reviewed and are negative.     Objective:   Physical Exam Vitals and nursing note reviewed.  Constitutional:      General: She is not in acute distress.    Appearance: Normal appearance. She is well-developed.  HENT:     Head: Normocephalic.     Right Ear: Tympanic membrane normal.     Left Ear: Tympanic membrane normal.     Nose: Nose normal.     Mouth/Throat:     Mouth: Mucous membranes are moist.  Eyes:     Pupils: Pupils are equal, round, and reactive to light.  Neck:     Vascular: No carotid bruit or JVD.  Cardiovascular:     Rate and Rhythm: Normal rate and regular rhythm.     Heart sounds: Normal heart sounds.  Pulmonary:     Effort: Pulmonary effort is normal. No respiratory distress.     Breath sounds: Normal breath sounds. No wheezing or rales.  Chest:     Chest wall: No tenderness.  Abdominal:     General: Bowel sounds are normal. There is no distension or abdominal bruit.     Palpations: Abdomen is soft. There is no hepatomegaly, splenomegaly, mass or pulsatile mass.     Tenderness: There is no abdominal tenderness.  Musculoskeletal:        General: Normal range of motion.     Cervical back: Normal range of motion and neck supple.  Lymphadenopathy:     Cervical: No cervical adenopathy.  Skin:    General: Skin is warm and dry.  Neurological:     Mental Status: She is alert and oriented to person, place, and time.     Deep Tendon Reflexes: Reflexes are normal and symmetric.  Psychiatric:        Behavior: Behavior normal.        Thought Content: Thought content normal.        Judgment: Judgment normal.    BP 120/71   Pulse 73   Temp 97.8 F (36.6 C) (Temporal)   Resp 20   Ht '5\' 4"'  (1.626 m)   Wt 128 lb (58.1 kg)   SpO2 91%   BMI 21.97 kg/m        Assessment & Plan:  Christina Silva comes in today with chief  complaint of Medical Management of Chronic Issues   Diagnosis and orders addressed:  1. Primary hypertension Low sodium diet - CBC with Differential/Platelet - CMP14+EGFR - amLODipine (NORVASC) 2.5 MG tablet; Take 1 tablet (2.5 mg total) by mouth every evening.  Dispense: 90 tablet; Refill: 1  2. Hyperlipidemia with target LDL  less than 100 Low fat diet - Lipid panel - rosuvastatin (CRESTOR) 10 MG tablet; Take 1 tablet (10 mg total) by mouth daily.  Dispense: 90 tablet; Refill: 1  3. Hyperglycemia Watch carbs in diet - Bayer DCA Hb A1c Waived  4. Gastroesophageal reflux disease without esophagitis Avoid spicy foods Do not eat 2 hours prior to bedtime - pantoprazole (PROTONIX) 40 MG tablet; Take 1 tablet (40 mg total) by mouth daily.  Dispense: 90 tablet; Refill: 1  5. Osteopenia of spine Weight bearing exercise  6. Late effect of cerebrovascular accident (CVA) Neurologist really thinks she is having mirgaines rather than TIA's  7. Anxiety state Stress management - diazepam (VALIUM) 2 MG tablet; TAKE ONE TABLET TWICE DAILY AS NEEDED  Dispense: 60 tablet; Refill: 2  8. Migraine without aura and without status migrainosus, not intractable Avoid caffeine - topiramate (TOPAMAX) 25 MG tablet; Take 1 tablet (25 mg total) by mouth 2 (two) times daily.  Dispense: 60 tablet; Refill: 3   Labs pending Health Maintenance reviewed Diet and exercise encouraged  Follow up plan: 6 months   Mary-Margaret Hassell Done, FNP

## 2021-02-09 NOTE — Patient Instructions (Signed)

## 2021-02-10 LAB — CBC WITH DIFFERENTIAL/PLATELET
Basophils Absolute: 0.1 10*3/uL (ref 0.0–0.2)
Basos: 1 %
EOS (ABSOLUTE): 0.1 10*3/uL (ref 0.0–0.4)
Eos: 2 %
Hematocrit: 44.6 % (ref 34.0–46.6)
Hemoglobin: 15.1 g/dL (ref 11.1–15.9)
Immature Grans (Abs): 0 10*3/uL (ref 0.0–0.1)
Immature Granulocytes: 0 %
Lymphocytes Absolute: 2 10*3/uL (ref 0.7–3.1)
Lymphs: 31 %
MCH: 31.5 pg (ref 26.6–33.0)
MCHC: 33.9 g/dL (ref 31.5–35.7)
MCV: 93 fL (ref 79–97)
Monocytes Absolute: 0.6 10*3/uL (ref 0.1–0.9)
Monocytes: 9 %
Neutrophils Absolute: 3.6 10*3/uL (ref 1.4–7.0)
Neutrophils: 57 %
Platelets: 297 10*3/uL (ref 150–450)
RBC: 4.8 x10E6/uL (ref 3.77–5.28)
RDW: 12 % (ref 11.7–15.4)
WBC: 6.4 10*3/uL (ref 3.4–10.8)

## 2021-02-10 LAB — CMP14+EGFR
ALT: 14 IU/L (ref 0–32)
AST: 18 IU/L (ref 0–40)
Albumin/Globulin Ratio: 2.5 — ABNORMAL HIGH (ref 1.2–2.2)
Albumin: 4.5 g/dL (ref 3.6–4.6)
Alkaline Phosphatase: 80 IU/L (ref 44–121)
BUN/Creatinine Ratio: 9 — ABNORMAL LOW (ref 12–28)
BUN: 6 mg/dL — ABNORMAL LOW (ref 8–27)
Bilirubin Total: 0.5 mg/dL (ref 0.0–1.2)
CO2: 24 mmol/L (ref 20–29)
Calcium: 10 mg/dL (ref 8.7–10.3)
Chloride: 109 mmol/L — ABNORMAL HIGH (ref 96–106)
Creatinine, Ser: 0.68 mg/dL (ref 0.57–1.00)
Globulin, Total: 1.8 g/dL (ref 1.5–4.5)
Glucose: 92 mg/dL (ref 65–99)
Potassium: 4.2 mmol/L (ref 3.5–5.2)
Sodium: 146 mmol/L — ABNORMAL HIGH (ref 134–144)
Total Protein: 6.3 g/dL (ref 6.0–8.5)
eGFR: 85 mL/min/{1.73_m2} (ref >59)

## 2021-02-10 LAB — LIPID PANEL
Chol/HDL Ratio: 2.8 ratio (ref 0.0–4.4)
Cholesterol, Total: 180 mg/dL (ref 100–199)
HDL: 65 mg/dL (ref >39)
LDL Chol Calc (NIH): 95 mg/dL (ref 0–99)
Triglycerides: 115 mg/dL (ref 0–149)
VLDL Cholesterol Cal: 20 mg/dL (ref 5–40)

## 2021-02-16 NOTE — Progress Notes (Signed)
Carelink Summary Report / Loop Recorder 

## 2021-02-23 LAB — CUP PACEART REMOTE DEVICE CHECK
Date Time Interrogation Session: 20220629133334
Implantable Pulse Generator Implant Date: 20220322

## 2021-02-25 ENCOUNTER — Ambulatory Visit (INDEPENDENT_AMBULATORY_CARE_PROVIDER_SITE_OTHER): Payer: Medicare Other

## 2021-02-25 DIAGNOSIS — I639 Cerebral infarction, unspecified: Secondary | ICD-10-CM

## 2021-03-01 ENCOUNTER — Telehealth: Payer: Self-pay | Admitting: Cardiology

## 2021-03-01 NOTE — Telephone Encounter (Signed)
Returned call to Pt.  Pt is taking rosuvastatin 10 mg daily.  Advised that would be ok, last note from Dr. Martinique indicated he would be ok with Pt taking 5 mg daily.  Advised Pt to call office if she had any trouble on the 10 mg dose.

## 2021-03-01 NOTE — Telephone Encounter (Signed)
New Message:     Pt wants to know what mg of Rosuvastatin should she be taking? Dr Leonie Man said he thought she should take mg, what does Dr Martinique think?e

## 2021-03-03 ENCOUNTER — Telehealth: Payer: Self-pay | Admitting: Cardiology

## 2021-03-03 DIAGNOSIS — E785 Hyperlipidemia, unspecified: Secondary | ICD-10-CM

## 2021-03-03 MED ORDER — ROSUVASTATIN CALCIUM 10 MG PO TABS: 10.0000 mg | ORAL_TABLET | Freq: Every day | ORAL | 3 refills | Status: DC

## 2021-03-03 NOTE — Telephone Encounter (Signed)
*  STAT* If patient is at the pharmacy, call can be transferred to refill team.   1. Which medications need to be refilled? (please list name of each medication and dose if known) rosuvastatin (CRESTOR) 10 MG tablet  2. Which pharmacy/location (including street and city if local pharmacy) is medication to be sent to? [place], [place]  3. Do they need a 30 day or 90 day supply? [id number]  Pharmacy stated they didn't get this prescription. They have the one that says 5mg

## 2021-03-03 NOTE — Telephone Encounter (Signed)
Will verify with MD if okay to send in Rosuvastatin 10 mg, per last message from Garwin he suggested to go to 5 mg, but patient is wanting the 10 mg. Will verify with MD and send to pharmacy.   Thank you!

## 2021-03-03 NOTE — Telephone Encounter (Signed)
Patient states the rosuvastatin was never received by the pharmacy. She states it needs to go to [facility] in [place].

## 2021-03-04 ENCOUNTER — Other Ambulatory Visit: Payer: Self-pay

## 2021-03-04 DIAGNOSIS — E785 Hyperlipidemia, unspecified: Secondary | ICD-10-CM

## 2021-03-04 MED ORDER — ROSUVASTATIN CALCIUM 10 MG PO TABS: 10.0000 mg | ORAL_TABLET | Freq: Every day | ORAL | 3 refills | Status: DC

## 2021-03-04 NOTE — Telephone Encounter (Signed)
Ok to continue 10 mg   Taras Rask Martinique MD, [facility]

## 2021-03-04 NOTE — Telephone Encounter (Signed)
RX sent to pharmacy  

## 2021-03-17 NOTE — Progress Notes (Signed)
Carelink Summary Report / Loop Recorder 

## 2021-03-29 LAB — CUP PACEART REMOTE DEVICE CHECK
Date Time Interrogation Session: 20220801132931
Implantable Pulse Generator Implant Date: 20220322

## 2021-03-30 ENCOUNTER — Ambulatory Visit (INDEPENDENT_AMBULATORY_CARE_PROVIDER_SITE_OTHER): Payer: Medicare Other

## 2021-03-30 DIAGNOSIS — I639 Cerebral infarction, unspecified: Secondary | ICD-10-CM | POA: Diagnosis not present

## 2021-04-07 ENCOUNTER — Encounter: Payer: Self-pay | Admitting: Physician Assistant

## 2021-04-07 ENCOUNTER — Other Ambulatory Visit: Payer: Self-pay

## 2021-04-07 ENCOUNTER — Ambulatory Visit: Payer: Medicare Other | Admitting: Physician Assistant

## 2021-04-07 VITALS — BP 140/72 | HR 93 | Resp 20 | Ht 64.0 in | Wt 126.4 lb

## 2021-04-07 DIAGNOSIS — E785 Hyperlipidemia, unspecified: Secondary | ICD-10-CM

## 2021-04-07 DIAGNOSIS — R079 Chest pain, unspecified: Secondary | ICD-10-CM

## 2021-04-07 DIAGNOSIS — Z9889 Other specified postprocedural states: Secondary | ICD-10-CM | POA: Diagnosis not present

## 2021-04-07 DIAGNOSIS — I10 Essential (primary) hypertension: Secondary | ICD-10-CM

## 2021-04-07 DIAGNOSIS — Z79899 Other long term (current) drug therapy: Secondary | ICD-10-CM | POA: Diagnosis not present

## 2021-04-07 DIAGNOSIS — Z8673 Personal history of transient ischemic attack (TIA), and cerebral infarction without residual deficits: Secondary | ICD-10-CM | POA: Diagnosis not present

## 2021-04-07 MED ORDER — ROSUVASTATIN CALCIUM 20 MG PO TABS: 20.0000 mg | ORAL_TABLET | Freq: Every day | ORAL | 3 refills | Status: DC

## 2021-04-07 NOTE — Patient Instructions (Addendum)
Medication Instructions:  INCREASE- Crestor 20 mg by mouth daily  *If you need a refill on your cardiac medications before your next appointment, please call your pharmacy*   Lab Work: Fasting Lipid and Liver in 2 Months  If you have labs (blood work) drawn today and your tests are completely normal, you will receive your results only by: [place] (if you have MyChart) OR A paper copy in the mail If you have any lab test that is abnormal or we need to change your treatment, we will call you to review the results.   Testing/Procedures: None Ordered   Follow-Up: At [facility], you and your health needs are our priority.  As part of our continuing mission to provide you with exceptional heart care, we have created designated Provider Care Teams.  These Care Teams include your primary Cardiologist (physician) and Advanced Practice Providers (APPs -  Physician Assistants and Nurse Practitioners) who all work together to provide you with the care you need, when you need it.  We recommend signing up for the patient portal called "MyChart".  Sign up information is provided on this After Visit Summary.  MyChart is used to connect with patients for Virtual Visits (Telemedicine).  Patients are able to view lab/test results, encounter notes, upcoming appointments, etc.  Non-urgent messages can be sent to your provider as well.   To learn more about what you can do with MyChart, go to [email or web address].    Your next appointment:   6 month(s)  The format for your next appointment:   In Person  Provider:   You may see Peter Martinique, MD or one of the following Advanced Practice Providers on your designated Care Team:   Almyra Deforest, PA-C Fabian Sharp, PA-C or  Roby Lofts, [place]

## 2021-04-07 NOTE — Progress Notes (Signed)
Cardiology Office Note:    Date:  04/09/2021   ID:  Christina Silva, DOB [birth year], MRN [id number]  PCP:  [name], [facility] Providers Cardiologist:  Peter Martinique, MD     Referring MD: [name], *   Chief Complaint  Patient presents with   Follow-up    Seen for Dr. Martinique    History of Present Illness:    Christina Silva is a 87 y.o. female with a hx of HTN, HLD, h/o CVA, GERD, anxiety and vertigo.  Patient was seen by Dr. Martinique in May 2019 for blood pressure management and lower extremity edema.  Echocardiogram at the time showed EF 55 to 60%, grade 1 DD, mild AI, mild to moderate MR/TR.  He February 2020, she suffered cryptogenic stroke and was placed on aspirin and Plavix for 1 month then aspirin alone after that.  Repeat echocardiogram in February 2020 showed EF 60 to 65%, grade 1 DD, basal septal hypertrophy, mild AI, moderate aortic annular calcifications, mild mitral annular calcification.  She had a TIA in July 2020 and was restarted on Plavix.  Plavix was later discontinued again by Dr. Leonie Man of neurology.  She was seen in August 2020 with atypical chest pain and negative troponin.  She was prescribed pantoprazole.  Follow-up Myoview was normal.  She was started on amlodipine for hypertension.  MRI of the brain obtained in January 2022 showed a new right posterior cerebral artery occlusion close to its origin and a stable appearance of moderate P1/P2 stenosis.  An implantable loop recorder was placed.  Patient was last seen by Dr. Martinique in March 2022 at which time she was doing well.  Patient presents today for follow-up.  She did have 1 episode of chest pain laying down about 2 months ago.  It went away without further recurrence.  At this time, she has no chest pain or worsening dyspnea.  She recently obtained blood work back in June at her PCPs office.  LDL unfortunately is still elevated in the 90s.  She previously had leg cramps which  prompted her to reduce the Crestor from 20 mg down to 10 mg.  However she was not confident her leg cramps is truly related to the Crestor.  She is willing to try the Crestor again at 20 mg daily.  She is aware that her LDL goal should be less than 70 given the prior history of stroke.  I recommend a fasting lipid panel and LFT in 6-monthand increase to Crestor to 20 mg daily.  She is scheduled to see Dr. ARayann Hemanin the next few days.  She is concerned that that her loop recorder has been displaced.  However I can see her surgical scar, I think her loop recorder is still in the right place.  Her heart rate is quite regular on exam.  She can follow-up with Dr. JMartiniquein 6 months.  Past Medical History:  Diagnosis Date   Anxiety    Cataract    GERD (gastroesophageal reflux disease)    Hyperlipidemia    Hypertension    SCCA (squamous cell carcinoma) of skin 11/02/2020   Mid Chest (in situ) (tx p bx)   SCCA (squamous cell carcinoma) of skin 11/02/2020   Right Forearm Posterior (in situ) (tx p bx)   SCCA (squamous cell carcinoma) of skin 11/02/2020   Left Submandibular Area (in situ) (tx p bx)   [place]   Vertigo    patient  denies    Past Surgical History:  Procedure Laterality Date   ABDOMINAL HYSTERECTOMY  1980   CATARACT EXTRACTION Right    CHOLECYSTECTOMY N/A 12/16/2019   Procedure: LAPAROSCOPIC CHOLECYSTECTOMY WITH INTRAOPERATIVE CHOLANGIOGRAM;  Surgeon: Erroll Luna, MD;  Location: [place];  Service: General;  Laterality: N/A;   EYE SURGERY     implantable loop recorder placement  11/12/2020    Medtronic Reveal Linq model [id number] implantable loop recorder (SN [id number]) implanted by Dr Rayann Heman for cryptogenic stroke   KNEE ARTHROSCOPY Left    ROTATOR CUFF REPAIR Left    SQUAMOUS CELL CARCINOMA EXCISION Left    TONSILLECTOMY      Current Medications: Current Meds  Medication Sig   amLODipine (NORVASC) 2.5 MG tablet Take 1 tablet (2.5 mg total) by mouth every evening.    Ascorbic Acid (VITAMIN C PO) Take by mouth.   aspirin EC 81 MG tablet Take 81 mg by mouth daily. Swallow whole.   cholecalciferol (VITAMIN D) 25 MCG (1000 UNIT) tablet Take 2,000 Units by mouth daily.   diazepam (VALIUM) 2 MG tablet TAKE ONE TABLET TWICE DAILY AS NEEDED   losartan (COZAAR) 100 MG tablet Take 1 tablet (100 mg total) by mouth daily. (NEEDS TO BE SEEN BEFORE NEXT REFILL)   omega-3 acid ethyl esters (LOVAZA) 1 g capsule Take by mouth 2 (two) times daily.   pantoprazole (PROTONIX) 40 MG tablet Take 1 tablet (40 mg total) by mouth daily.   topiramate (TOPAMAX) 25 MG tablet Take 1 tablet (25 mg total) by mouth 2 (two) times daily.   Ubiquinone (ULTRA COQ10 PO) Take by mouth 2 (two) times daily.   vitamin C (ASCORBIC ACID) 500 MG tablet Take 500 mg by mouth daily.   [DISCONTINUED] rosuvastatin (CRESTOR) 10 MG tablet Take 1 tablet (10 mg total) by mouth daily.     Allergies:   Codeine, Lisinopril, Morphine, Other, Sulfa antibiotics, and Sulfonamide derivatives   Social History   Socioeconomic History   Marital status: Widowed    Spouse name: Not on file   Number of children: 2   Years of education: 10   Highest education level: Not on file  Occupational History   Occupation: retired  Tobacco Use   Smoking status: Former    Types: Cigarettes    Quit date: 01/09/1983    Years since quitting: 38.2   Smokeless tobacco: Never  Vaping Use   Vaping Use: Never used  Substance and Sexual Activity   Alcohol use: No   Drug use: No   Sexual activity: Yes  Other Topics Concern   Not on file  Social History Narrative   Lives alone   Right Handed   Drinks caffeine occassionally   Social Determinants of Health   Financial Resource Strain: Low Risk    Difficulty of Paying Living Expenses: Not hard at all  Food Insecurity: No Food Insecurity   Worried About Charity fundraiser in the Last Year: Never true   Ran Out of Food in the Last Year: Never true  Transportation Needs:  Not on file  Physical Activity: Not on file  Stress: No Stress Concern Present   Feeling of Stress : Only a little  Social Connections: Moderately Integrated   Frequency of Communication with Friends and Family: More than three times a week   Frequency of Social Gatherings with Friends and Family: More than three times a week   Attends Religious Services: More than 4 times per year   Active Member  of Clubs or Organizations: Yes   Attends Archivist Meetings: 1 to 4 times per year   Marital Status: Widowed     Family History: The patient's family history includes Arthritis in her brother; Epilepsy in her brother; Heart attack in her mother; Heart disease in her mother and sister; Hyperlipidemia in her mother; Psychiatric Illness in her sister; Stroke in her father; Transient ischemic attack in her father. There is no history of Colon cancer, Esophageal cancer, Rectal cancer, or Stomach cancer.  ROS:   Please see the history of present illness.     All other systems reviewed and are negative.  EKGs/Labs/Other Studies Reviewed:    The following studies were reviewed today:  Echo 10/18/2018 1. The left ventricle has normal systolic function with an ejection  fraction of 60-65%. The cavity size was normal. There is basal septal  hypertophy. Left ventricular diastolic Doppler parameters are consistent  with impaired relaxation.   2. The right ventricle has normal systolic function. The cavity was  normal. There is no increase in right ventricular wall thickness.   3. The aortic valve is tricuspid Moderate thickening of the aortic valve  Moderate calcification of the aortic valve. Aortic valve regurgitation is  mild by color flow Doppler.. Moderate aortic annular calcification noted.   4. The mitral valve is normal in structure. Mild thickening of the mitral  valve leaflet. Mild calcification of the mitral valve leaflet. There is  mild mitral annular calcification present. No  evidence of mitral valve  stenosis.   5. The tricuspid valve is normal in structure.   6. The pulmonic valve was grossly normal. Pulmonic valve regurgitation is  mild by color flow Doppler.   7. The aortic root is normal in size and structure.   8. Pulmonary hypertension is indeterminat,inadequate TR jet.   EKG:  EKG is not ordered today.    Recent Labs: 02/09/2021: ALT 14; BUN 6; Creatinine, Ser 0.68; Hemoglobin 15.1; Platelets 297; Potassium 4.2; Sodium 146  Recent Lipid Panel    Component Value Date/Time   CHOL 180 02/09/2021 0851   TRIG 115 02/09/2021 0851   TRIG 149 02/26/2014 0930   HDL 65 02/09/2021 0851   HDL 68 02/26/2014 0930   CHOLHDL 2.8 02/09/2021 0851   CHOLHDL 3.6 10/18/2018 0625   VLDL 27 10/18/2018 0625   LDLCALC 95 02/09/2021 0851   LDLCALC 144 (H) 02/26/2014 0930     Risk Assessment/Calculations:           Physical Exam:    VS:  BP 140/72   Pulse 93   Resp 20   Ht '5\' 4"'$  (1.626 m)   Wt 126 lb 6.4 oz (57.3 kg)   SpO2 97%   BMI 21.70 kg/m     Wt Readings from Last 3 Encounters:  04/07/21 126 lb 6.4 oz (57.3 kg)  02/09/21 128 lb (58.1 kg)  02/08/21 131 lb 6.4 oz (59.6 kg)     GEN:  Well nourished, well developed in no acute distress HEENT: Normal NECK: No JVD; No carotid bruits LYMPHATICS: No lymphadenopathy CARDIAC: RRR, no murmurs, rubs, gallops RESPIRATORY:  Clear to auscultation without rales, wheezing or rhonchi  ABDOMEN: Soft, non-tender, non-distended MUSCULOSKELETAL:  No edema; No deformity  SKIN: Warm and dry NEUROLOGIC:  Alert and oriented x 3 PSYCHIATRIC:  Normal affect   ASSESSMENT:    1. Chest pain of uncertain etiology   2. Hyperlipidemia with target LDL less than 100   3. Medication management  4. Primary hypertension   5. History of loop recorder   6. H/O: CVA (cerebrovascular accident)    PLAN:    In order of problems listed above:  Solitary episode of chest pain: Solitary episode occurred while the patient was  laying down 52-monthago, this episode went away and has not recurred despite activity level.  We will continue to monitor at this time.  Hyperlipidemia: LDL remains elevated, increase Crestor to 20 mg daily.  Obtain fasting lipid panel and LFT in 2 months  Hypertension: Blood pressure stable  History of loop recorder: Implanted in the setting of stroke.  So far no obvious arrhythmia was noted.  Patient is concerned that her loop recorder has moved, however on exam, the loop recorder is still in the right place.  She has upcoming visit with Dr. Already  History of stroke: No recurrence        Medication Adjustments/Labs and Tests Ordered: Current medicines are reviewed at length with the patient today.  Concerns regarding medicines are outlined above.  Orders Placed This Encounter  Procedures   Lipid panel   Hepatic function panel   Meds ordered this encounter  Medications   rosuvastatin (CRESTOR) 20 MG tablet    Sig: Take 1 tablet (20 mg total) by mouth daily.    Dispense:  90 tablet    Refill:  3    Patient Instructions  Medication Instructions:  INCREASE- Crestor 20 mg by mouth daily  *If you need a refill on your cardiac medications before your next appointment, please call your pharmacy*   Lab Work: Fasting Lipid and Liver in 2 Months  If you have labs (blood work) drawn today and your tests are completely normal, you will receive your results only by: [place](if you have MyChart) OR A paper copy in the mail If you have any lab test that is abnormal or we need to change your treatment, we will call you to review the results.   Testing/Procedures: None Ordered   Follow-Up: At [facility] you and your health needs are our priority.  As part of our continuing mission to provide you with exceptional heart care, we have created designated Provider Care Teams.  These Care Teams include your primary Cardiologist (physician) and Advanced Practice Providers  (APPs -  Physician Assistants and Nurse Practitioners) who all work together to provide you with the care you need, when you need it.  We recommend signing up for the patient portal called "MyChart".  Sign up information is provided on this After Visit Summary.  MyChart is used to connect with patients for Virtual Visits (Telemedicine).  Patients are able to view lab/test results, encounter notes, upcoming appointments, etc.  Non-urgent messages can be sent to your provider as well.   To learn more about what you can do with MyChart, go to [email or web address]    Your next appointment:   6 month(s)  The format for your next appointment:   In Person  Provider:   You may see Peter JMartinique MD or one of the following Advanced Practice Providers on your designated Care Team:   HAlmyra Deforest PA-C AFabian Sharp PA-C or  KRoby Lofts PA-C     Signed, HSonora PUtah 04/09/2021 11:46 PM    [place]

## 2021-04-09 ENCOUNTER — Encounter: Payer: Self-pay | Admitting: Physician Assistant

## 2021-04-18 ENCOUNTER — Ambulatory Visit: Payer: Medicare Other | Admitting: Internal Medicine

## 2021-04-18 ENCOUNTER — Other Ambulatory Visit: Payer: Self-pay

## 2021-04-18 VITALS — BP 128/70 | HR 111 | Ht 64.0 in | Wt 129.6 lb

## 2021-04-18 DIAGNOSIS — I639 Cerebral infarction, unspecified: Secondary | ICD-10-CM

## 2021-04-18 NOTE — Progress Notes (Signed)
PCP: [name], FNP Primary Cardiologist: Dr Martinique Primary EP: Dr Rayann Heman  Christina Silva is a 87 y.o. female who presents today for routine electrophysiology followup.  Since last being seen in our clinic, the patient reports doing very well.  Today, she denies symptoms of palpitations, chest pain, shortness of breath,  lower extremity edema, dizziness, presyncope, or syncope.  The patient is otherwise without complaint today.   Past Medical History:  Diagnosis Date   Anxiety    Cataract    GERD (gastroesophageal reflux disease)    Hyperlipidemia    Hypertension    SCCA (squamous cell carcinoma) of skin 11/02/2020   Mid Chest (in situ) (tx p bx)   SCCA (squamous cell carcinoma) of skin 11/02/2020   Right Forearm Posterior (in situ) (tx p bx)   SCCA (squamous cell carcinoma) of skin 11/02/2020   Left Submandibular Area (in situ) (tx p bx)   Stroke ([place]) 09/2018   Vertigo    patient denies   Past Surgical History:  Procedure Laterality Date   ABDOMINAL HYSTERECTOMY  1980   CATARACT EXTRACTION Right    CHOLECYSTECTOMY N/A 12/16/2019   Procedure: LAPAROSCOPIC CHOLECYSTECTOMY WITH INTRAOPERATIVE CHOLANGIOGRAM;  Surgeon: Erroll Luna, MD;  Location: [facility];  Service: General;  Laterality: N/A;   EYE SURGERY     implantable loop recorder placement  11/12/2020    Medtronic Reveal Linq model [id number] implantable loop recorder (SN [id number]) implanted by Dr Rayann Heman for cryptogenic stroke   KNEE ARTHROSCOPY Left    ROTATOR CUFF REPAIR Left    SQUAMOUS CELL CARCINOMA EXCISION Left    TONSILLECTOMY      ROS- all systems are reviewed and negatives except as per HPI above  Current Outpatient Medications  Medication Sig Dispense Refill   amLODipine (NORVASC) 2.5 MG tablet Take 1 tablet (2.5 mg total) by mouth every evening. 90 tablet 1   Ascorbic Acid (VITAMIN C PO) Take by mouth.     aspirin EC 81 MG tablet Take 81 mg by mouth daily. Swallow whole.     cholecalciferol  (VITAMIN D) 25 MCG (1000 UNIT) tablet Take 2,000 Units by mouth daily.     diazepam (VALIUM) 2 MG tablet TAKE ONE TABLET TWICE DAILY AS NEEDED 60 tablet 2   losartan (COZAAR) 100 MG tablet Take 1 tablet (100 mg total) by mouth daily. (NEEDS TO BE SEEN BEFORE NEXT REFILL) 30 tablet 0   omega-3 acid ethyl esters (LOVAZA) 1 g capsule Take by mouth 2 (two) times daily.     pantoprazole (PROTONIX) 40 MG tablet Take 1 tablet (40 mg total) by mouth daily. 90 tablet 1   rosuvastatin (CRESTOR) 20 MG tablet Take 1 tablet (20 mg total) by mouth daily. 90 tablet 3   topiramate (TOPAMAX) 25 MG tablet Take 1 tablet (25 mg total) by mouth 2 (two) times daily. 60 tablet 3   Ubiquinone (ULTRA COQ10 PO) Take by mouth 2 (two) times daily.     vitamin C (ASCORBIC ACID) 500 MG tablet Take 500 mg by mouth daily.     No current facility-administered medications for this visit.    Physical Exam: Vitals:   04/18/21 1533  BP: 128/70  Pulse: (!) 111  SpO2: 96%  Weight: 129 lb 9.6 oz (58.8 kg)  Height: '5\' 4"'$  (1.626 m)    GEN- The patient is well appearing, alert and oriented x 3 today.   Head- normocephalic, atraumatic Eyes-  Sclera clear, conjunctiva pink Ears- hearing intact Oropharynx- clear  Lungs-  normal work of breathing Heart- Regular rate and rhythm,  Skin- ILR site is well healed    Wt Readings from Last 3 Encounters:  04/18/21 129 lb 9.6 oz (58.8 kg)  04/07/21 126 lb 6.4 oz (57.3 kg)  02/09/21 128 lb (58.1 kg)    EKG tracing ordered today is personally reviewed and shows sinus tachycardia  Assessment and Plan:  Cryptogenic stroke She is monitored with ILR, without AF detected.  Continue to monitor  2. HTN Stable No change required today  3. HL Stable No change required today  Follow-up with Dr Martinique as scheduled I will see as needed  Thompson Grayer MD, [facility] 04/18/2021 3:44 PM

## 2021-04-18 NOTE — Patient Instructions (Addendum)
Medication Instructions:  Your physician recommends that you continue on your current medications as directed. Please refer to the Current Medication list given to you today.  Labwork: None ordered.  Testing/Procedures: None ordered.  Follow-Up: Your physician wants you to follow-up in: As needed with  Thompson Grayer, MD    Any Other Special Instructions Will Be Listed Below (If Applicable).  If you need a refill on your cardiac medications before your next appointment, please call your pharmacy.

## 2021-04-26 NOTE — Progress Notes (Signed)
Carelink Summary Report / Loop Recorder 

## 2021-04-30 ENCOUNTER — Other Ambulatory Visit: Payer: Self-pay | Admitting: Nurse Practitioner

## 2021-04-30 DIAGNOSIS — I10 Essential (primary) hypertension: Secondary | ICD-10-CM

## 2021-05-03 ENCOUNTER — Ambulatory Visit (INDEPENDENT_AMBULATORY_CARE_PROVIDER_SITE_OTHER): Payer: Medicare Other

## 2021-05-03 DIAGNOSIS — I639 Cerebral infarction, unspecified: Secondary | ICD-10-CM

## 2021-05-03 LAB — CUP PACEART REMOTE DEVICE CHECK
Date Time Interrogation Session: 20220903133353
Implantable Pulse Generator Implant Date: 20220322

## 2021-05-11 ENCOUNTER — Ambulatory Visit: Payer: Medicare Other | Admitting: Adult Health

## 2021-05-12 NOTE — Progress Notes (Signed)
Carelink Summary Report / Loop Recorder 

## 2021-05-19 ENCOUNTER — Ambulatory Visit: Payer: Medicare Other | Admitting: Adult Health

## 2021-05-19 ENCOUNTER — Other Ambulatory Visit: Payer: Self-pay

## 2021-05-19 ENCOUNTER — Encounter: Payer: Self-pay | Admitting: Adult Health

## 2021-05-19 VITALS — BP 162/80 | HR 74 | Ht 64.0 in | Wt 131.0 lb

## 2021-05-19 DIAGNOSIS — I639 Cerebral infarction, unspecified: Secondary | ICD-10-CM | POA: Diagnosis not present

## 2021-05-19 DIAGNOSIS — G43009 Migraine without aura, not intractable, without status migrainosus: Secondary | ICD-10-CM | POA: Diagnosis not present

## 2021-05-19 MED ORDER — UBRELVY 100 MG PO TABS: 100.0000 mg | ORAL_TABLET | ORAL | 0 refills | Status: DC | PRN

## 2021-05-19 MED ORDER — TOPIRAMATE 50 MG PO TABS: 50.0000 mg | ORAL_TABLET | Freq: Two times a day (BID) | ORAL | 3 refills | Status: DC

## 2021-05-19 NOTE — Patient Instructions (Signed)
Increase topamax 50mg  twice daily  Try Ubrelvy 100mg  as needed for acute migraine management. May repeat x1 after 2 hours if needed. Please let me know if this works or not. If not, we will then try Nurtec.   Continue aspirin 81 mg daily  and Crestor 20mg  daily  for secondary stroke prevention  Continue to follow up with PCP regarding cholesterol and blood pressure management      Followup in the future with me in 3 months or call earlier if needed       Thank you for coming to see [place] at [facility]. I hope we have been able to provide you high quality care today.  You may receive a patient satisfaction survey over the next few weeks. We would appreciate your feedback and comments so that we may continue to improve ourselves and the health of our patients.

## 2021-05-19 NOTE — Progress Notes (Signed)
[facility] [address] [place]. [place] [address] [phone]       OFFICE FOLLOW UP VISIT NOTE  [name] Date of Birth:  June 01, 1933 Medical Record Number:  [id number]   Primary neurologist: Dr. Leonie Man Referring MD: [name], [place]  Reason for Referral: Stroke 2nd opinion  Chief Complaint  Patient presents with   Follow-up    Rm 2 with daughter here for 3 month f/u. Reports since her last visit she has been doing ok. Has noticed some double vision. Reports 4 events have been noted over the last month and have taken place primarily in the car      HPI:   Initial visit 10/19/2020 Dr. Leonie Man: Christina Silva is an 87 year old pleasant Caucasian lady seen today for initial office consultation visit. She is accompanied by her daughter. History is obtained from them and review of electronic medical records and up reviewed personally pertinent imaging films in PACS. She has past medical history of anxiety, hypertension, hyperlipidemia and gastroesophageal reflux disease. She was admitted to [facility] on 10/18/2018 with sudden onset of left upper extremity weakness and numbness. MRI scan at that time it showed a large 2 cm right basal ganglia infarct and MRI of the brain had shown moderate left P2 stenosis. Echocardiogram was unremarkable. She was started on aspirin and Plavix and will saw Dr. Merlene Laughter. She states she did well from the stroke and made good recovery. 6 months ago she had several episodes of transient left hemifield vision loss along with the dull headache which lasted 15 minutes and she also saw flashing lights. This occurred once daily for 3 days. 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 However MRA of the brain done on 09/01/2020 showed a new right posterior cerebral artery occlusion close to its origin and stable appearance of the  moderate left P1/P2 stenosis. Prior carotid ultrasound on 04/22/2020 had shown no significant extracranial stenosis. Patient is not sure when her last lipid profile or A1c was checked. She is on aspirin and Plavix and tolerating it well without major bleeding but does have frequent bruising. On inquiry she admits to a few episodes of transient horizontal diplopia that she has had in the last few months. This has occurred mostly when she is driving she can see 2 lines instead of one and on one occasion she had to pull off the road and sit in [place] parking lot till this improved. She has been scared to drive since then. She has seen Dr. Merlene Laughter in the past and he had suggested doing a 30-day heart monitor but for unclear reason that this never happened and patient was unhappy and is here to see me for a 2nd opinion. Update 02/08/2021 Dr. Leonie Man; she returns for follow-up after last visit on 10/19/2020.  She is accompanied by her daughter [name].  Patient states she has had 3 episodes of brief visual disturbance followed by mild headache on May 9, May 10 and June 11.  They were all stereotypical and similar.  Whole episode lasted 10 to 15 minutes.  She just noticed some visual disturbance in the left eye which moved across to the right eye.  She could still see.  She had a mild dull headache later.  There were no clear triggers or precipitating factors that she could identify.  Patient had lab work at last visit on 10/19/2020 which  showed ESR of 14 mm.  LDL cholesterol 111 mg percent and triglycerides 156.  A hemoglobin A1c was 5.8.  I recommended we increase the dose of Crestor to 10 mg but patient's primary physician increased to 20 mg which she did not tolerate and developed muscle aches and pains and it was discontinued.  She has been since restarted on 5 mg of Crestor which she is so far tolerating well.  She was also started on Topamax by Dr. Merlene Laughter neurologist but she is taking it only as as needed.  She is  tolerating aspirin well without any bruising or bleeding.  Blood pressure is usually well controlled today slightly elevated in office at 152/74.  She has no new complaints.  She had outpatient loop recorder placed by Dr. Rayann Heman and so for paroxysmal A. fib has not yet been found.  Update 05/19/2021 JM: Returns for 85-monthfollow-up after prior visit with Dr. SLeonie Man  She is accompanied by her daughter.  Reports 1 episode last week of flashing lights in vision followed by typical headache as well as 4 episodes of double vision while in the car (2 while driving, 2 while riding) lasting 3-4 min followed by typical headache.  She reports experiencing double vision intermittently over the past year.  Routinely followed by ophthalmology.  Has remained on topiramate 25 mg twice daily tolerating well but does not believe this is providing much benefit.  Denies new neurological symptoms.  Remains on aspirin and Crestor 20 mg daily without side effects.  Blood pressure today elevated at 162/80. Routinely monitor at home and typically stable around 120s/60-70s. Loop recorder has not shown atrial fibrillation thus far.  No new concerns at this time.      ROS:   14 system review of systems is positive for those listed in HPI and all other systems negative  PMH:  Past Medical History:  Diagnosis Date   Anxiety    Cataract    GERD (gastroesophageal reflux disease)    Hyperlipidemia    Hypertension    SCCA (squamous cell carcinoma) of skin 11/02/2020   Mid Chest (in situ) (tx p bx)   SCCA (squamous cell carcinoma) of skin 11/02/2020   Right Forearm Posterior (in situ) (tx p bx)   SCCA (squamous cell carcinoma) of skin 11/02/2020   Left Submandibular Area (in situ) (tx p bx)   Stroke ([facility] 09/2018   Vertigo    patient denies    Social History:  Social History   Socioeconomic History   Marital status: Widowed    Spouse name: Not on file   Number of children: 2   Years of education: 10   Highest  education level: Not on file  Occupational History   Occupation: retired  Tobacco Use   Smoking status: Former    Types: Cigarettes    Quit date: 01/09/1983    Years since quitting: 38.3   Smokeless tobacco: Never  Vaping Use   Vaping Use: Never used  Substance and Sexual Activity   Alcohol use: No   Drug use: No   Sexual activity: Yes  Other Topics Concern   Not on file  Social History Narrative   Lives alone   Right Handed   Drinks caffeine occassionally   Social Determinants of Health   Financial Resource Strain: Low Risk    Difficulty of Paying Living Expenses: Not hard at all  Food Insecurity: No Food Insecurity   Worried About RCharity fundraiserin the Last Year:  Never true   Ran Out of Food in the Last Year: Never true  Transportation Needs: Not on file  Physical Activity: Not on file  Stress: No Stress Concern Present   Feeling of Stress : Only a little  Social Connections: Moderately Integrated   Frequency of Communication with Friends and Family: More than three times a week   Frequency of Social Gatherings with Friends and Family: More than three times a week   Attends Religious Services: More than 4 times per year   Active Member of [employer] or Organizations: Yes   Attends Archivist Meetings: 1 to 4 times per year   Marital Status: Widowed  Intimate Partner Violence: Not on file    Medications:   Current Outpatient Medications on File Prior to Visit  Medication Sig Dispense Refill   amLODipine (NORVASC) 2.5 MG tablet Take 1 tablet (2.5 mg total) by mouth every evening. 90 tablet 1   aspirin EC 81 MG tablet Take 81 mg by mouth daily. Swallow whole.     cholecalciferol (VITAMIN D) 25 MCG (1000 UNIT) tablet Take 2,000 Units by mouth daily.     diazepam (VALIUM) 2 MG tablet TAKE ONE TABLET TWICE DAILY AS NEEDED 60 tablet 2   losartan (COZAAR) 100 MG tablet TAKE ONE (1) TABLET EACH DAY 90 tablet 1   omega-3 acid ethyl esters (LOVAZA) 1 g capsule Take  by mouth 2 (two) times daily.     pantoprazole (PROTONIX) 40 MG tablet Take 1 tablet (40 mg total) by mouth daily. 90 tablet 1   rosuvastatin (CRESTOR) 20 MG tablet Take 1 tablet (20 mg total) by mouth daily. 90 tablet 3   Ubiquinone (ULTRA COQ10 PO) Take 100 mg by mouth in the morning and at bedtime.     vitamin C (ASCORBIC ACID) 500 MG tablet Take 500 mg by mouth daily.     topiramate (TOPAMAX) 25 MG tablet Take 1 tablet (25 mg total) by mouth 2 (two) times daily. (Patient not taking: Reported on 05/19/2021) 60 tablet 3   No current facility-administered medications on file prior to visit.    Allergies:   Allergies  Allergen Reactions   Codeine Nausea And Vomiting   Lisinopril Swelling   Morphine Nausea And Vomiting   Other    Sulfa Antibiotics Nausea Only and Rash   Sulfonamide Derivatives Nausea Only and Rash    Physical Exam Today's Vitals   05/19/21 1034  BP: (!) 162/80  Pulse: 74  SpO2: 97%  Weight: 131 lb (59.4 kg)  Height: '5\' 4"'  (1.626 m)   Body mass index is 22.49 kg/m.   General: Very pleasant frail elderly Caucasian lady seated, in no evident distress Head: head normocephalic and atraumatic.   Neck: supple with soft right greater than left carotid bruits. Cardiovascular: Regular rate and rhythm. No murmur. Musculoskeletal: no deformity Skin:  no rash/petichiae Vascular:  Normal pulses all extremities  Neurologic Exam Mental Status: Awake and fully alert.  Fluent speech and language.  Oriented to place and time. Recent and remote memory intact. Attention span, concentration and fund of knowledge appropriate. Mood and affect appropriate.  Cranial Nerves: Pupils equal, briskly reactive to light. Extraocular movements full without nystagmus. Visual fields full to confrontation. Hearing diminished bilaterally.  Facial sensation intact. Face, tongue, palate moves normally and symmetrically.  Motor: Normal bulk and tone. Normal strength in all tested extremity  muscles. Sensory.: intact to touch , pinprick , position and vibratory sensation.  Coordination: Rapid alternating movements  normal in all extremities. Finger-to-nose and heel-to-shin performed accurately bilaterally. Gait and Station: Arises from chair without difficulty. Stance is normal. Gait demonstrates normal stride length and balance . Able to heel, toe and tandem walk with mild difficulty.  Reflexes: 1+ and symmetric. Toes downgoing.       ASSESSMENT/PLAN: 87 year old Caucasian lady with a large right basal ganglia infarct in February 2020 of cryptogenic etiology with recurrent  episodes of transient visual disturbances followed by mild headache likely atypical  migraine rather than TIAs.      1.  Atypical migraines -Increase Topamax from 25 mg twice daily to 50 mg twice daily for migraine prevention -Trial Ubrelvy for acute migraine management -provided samples and advised to call office if beneficial.  If no benefit, would recommend trialing Nurtec.  She is not candidate for triptans with prior stroke history -Encouraged migraine diary to further identify possible triggers   2. Hx of R BG stroke, cryptogenic  -Continue aspirin 81 mg daily and Crestor 20 mg daily for secondary stroke prevention measures -Recorder has not shown atrial fibrillation thus far -Routine follow-up with PCP for aggressive stroke risk factor management - maintain aggressive risk factor modification with strict control of hypertension with blood pressure goal below 130/90, and lipids with LDL cholesterol goal below 70 mg percent.   -Lipid panel 02/09/2021 LDL 95 - Crestor dosage increased to 56m daily   Follow-up in 3 months or call earlier if needed    CC:  [facility] provider: Dr. SHyman Bower Mary-Margaret, Christina Silva   I spent 34 minutes of face-to-face and non-face-to-face time with patient and daughter.  This included previsit chart review, lab review, study review, order entry, electronic health record  documentation, patient and daughter education and discussion regarding atypical migraines and use of medications, prior history of stroke, secondary stroke prevention measures and aggressive stroke risk factor management, and answered all other questions to patient daughter satisfaction  JFrann Rider AGNP-BC  [facility] [address] [address] Phone [phone] [phone]: This document was prepared with digital dictation and possible smart phrase technology. Any transcriptional errors that result from this process are unintentional.

## 2021-05-20 NOTE — Progress Notes (Signed)
I agree with the above plan 

## 2021-06-06 ENCOUNTER — Ambulatory Visit (INDEPENDENT_AMBULATORY_CARE_PROVIDER_SITE_OTHER): Payer: Medicare Other

## 2021-06-06 DIAGNOSIS — I639 Cerebral infarction, unspecified: Secondary | ICD-10-CM | POA: Diagnosis not present

## 2021-06-08 LAB — CUP PACEART REMOTE DEVICE CHECK
Date Time Interrogation Session: 20221006133107
Implantable Pulse Generator Implant Date: 20220322

## 2021-06-14 ENCOUNTER — Telehealth: Payer: Self-pay | Admitting: Nurse Practitioner

## 2021-06-14 ENCOUNTER — Ambulatory Visit: Payer: Medicare Other | Admitting: Nurse Practitioner

## 2021-06-14 ENCOUNTER — Other Ambulatory Visit: Payer: Self-pay

## 2021-06-14 ENCOUNTER — Ambulatory Visit (INDEPENDENT_AMBULATORY_CARE_PROVIDER_SITE_OTHER): Payer: Medicare Other | Admitting: Nurse Practitioner

## 2021-06-14 ENCOUNTER — Encounter: Payer: Self-pay | Admitting: Nurse Practitioner

## 2021-06-14 VITALS — BP 126/71 | HR 84 | Temp 97.8°F | Resp 20 | Ht 64.0 in | Wt 126.0 lb

## 2021-06-14 DIAGNOSIS — Z23 Encounter for immunization: Secondary | ICD-10-CM

## 2021-06-14 DIAGNOSIS — E785 Hyperlipidemia, unspecified: Secondary | ICD-10-CM | POA: Diagnosis not present

## 2021-06-14 DIAGNOSIS — G43009 Migraine without aura, not intractable, without status migrainosus: Secondary | ICD-10-CM

## 2021-06-14 DIAGNOSIS — Z79899 Other long term (current) drug therapy: Secondary | ICD-10-CM | POA: Diagnosis not present

## 2021-06-14 DIAGNOSIS — I10 Essential (primary) hypertension: Secondary | ICD-10-CM

## 2021-06-14 MED ORDER — UBRELVY 100 MG PO TABS: 100.0000 mg | ORAL_TABLET | ORAL | 1 refills | Status: DC | PRN

## 2021-06-14 NOTE — Progress Notes (Signed)
   Subjective:    Patient ID: Christina Silva, female    DOB: 1933/05/25, 87 y.o.   MRN: [id number]   Chief Complaint: Headache (Neurologist started her on meds for migraines/)   HPI Patient comes in today c/o headaches.she has a mild headache everyday  and some times a migraine. The neurologist started her on topamax BID- she does not think that it helps her at all. She saw PA at neurolgist office 3 weeks ago and she told them that the topamax was not helping her. They gave her some ubrelvy to take when she has a headache.     Review of Systems  Constitutional:  Negative for diaphoresis.  Eyes:  Negative for pain.  Respiratory:  Negative for shortness of breath.   Cardiovascular:  Negative for chest pain, palpitations and leg swelling.  Gastrointestinal:  Negative for abdominal pain.  Endocrine: Negative for polydipsia.  Skin:  Negative for rash.  Neurological:  Positive for headaches. Negative for dizziness and weakness.  Hematological:  Does not bruise/bleed easily.  All other systems reviewed and are negative.     Objective:   Physical Exam Vitals reviewed.  Constitutional:      Appearance: She is well-developed.  Cardiovascular:     Rate and Rhythm: Normal rate and regular rhythm.  Pulmonary:     Breath sounds: Normal breath sounds.  Abdominal:     General: Bowel sounds are normal.  Skin:    General: Skin is warm.  Neurological:     Mental Status: She is alert.  Psychiatric:        Mood and Affect: Mood normal.        Behavior: Behavior normal.    BP 126/71   Pulse 84   Temp 97.8 F (36.6 C) (Temporal)   Resp 20   Ht 5\' 4"  (1.626 m)   Wt 126 lb (57.2 kg)   SpO2 98%   BMI 21.63 kg/m        Assessment & Plan:   Christina Silva in today with chief complaint of Headache (Neurologist started her on meds for migraines/)   1. Migraine without aura and without status migrainosus, not intractable Refilled ubrevly to take on as needed basis Continue to  keep diary of headaches for neurologist Continue to follow up with neurologist RTO prn    The above assessment and management plan was discussed with the patient. The patient verbalized understanding of and has agreed to the management plan. Patient is aware to call the clinic if symptoms persist or worsen. Patient is aware when to return to the clinic for a follow-up visit. Patient educated on when it is appropriate to go to the emergency department.   Mary-Margaret Hassell Done, FNP

## 2021-06-14 NOTE — Telephone Encounter (Signed)
(  Key: [id number] Silva 100MG  tablets Your information has been sent to [facility]

## 2021-06-14 NOTE — Addendum Note (Signed)
Addended by: [name] on: 06/14/2021 09:32 AM   Modules accepted: Orders

## 2021-06-15 ENCOUNTER — Telehealth: Payer: Self-pay | Admitting: Nurse Practitioner

## 2021-06-15 LAB — HEPATIC FUNCTION PANEL
ALT: 10 IU/L (ref 0–32)
AST: 17 IU/L (ref 0–40)
Albumin: 4.5 g/dL (ref 3.6–4.6)
Alkaline Phosphatase: 77 IU/L (ref 44–121)
Bilirubin Total: 0.4 mg/dL (ref 0.0–1.2)
Bilirubin, Direct: 0.13 mg/dL (ref 0.00–0.40)
Total Protein: 6.9 g/dL (ref 6.0–8.5)

## 2021-06-15 LAB — LIPID PANEL
Chol/HDL Ratio: 2.6 ratio (ref 0.0–4.4)
Cholesterol, Total: 154 mg/dL (ref 100–199)
HDL: 59 mg/dL (ref >39)
LDL Chol Calc (NIH): 77 mg/dL (ref 0–99)
Triglycerides: 98 mg/dL (ref 0–149)
VLDL Cholesterol Cal: 18 mg/dL (ref 5–40)

## 2021-06-15 NOTE — Telephone Encounter (Signed)
I put a message in Christina Silva [birth year] that she wants to cancel the message until she talks to her urologist. It's for a PA on medication MMM called in yesterday. - Per [employer]  JHB

## 2021-06-15 NOTE — Progress Notes (Signed)
Carelink Summary Report / Loop Recorder 

## 2021-06-16 NOTE — Telephone Encounter (Signed)
Tell patient that she will have to speak to neurology about this since they are the ones that suggested she try it and gave her samples.

## 2021-06-16 NOTE — Telephone Encounter (Signed)
Attempted to contact patient - NA °

## 2021-06-17 NOTE — Telephone Encounter (Signed)
As stated yesterday- patient need sto talk with neurologist about this. That is who originally gave her samples

## 2021-06-17 NOTE — Telephone Encounter (Signed)
outcome Denied on October 18 Request Reference Number: [id number]. UBRELVY TAB 100MG  is denied for not meeting the prior authorization requirement(s). Details of this decision are in the notice attached below or have been faxed to you. Drug Roselyn Meier 100MG  tablets

## 2021-06-20 ENCOUNTER — Telehealth: Payer: Self-pay | Admitting: Nurse Practitioner

## 2021-06-20 NOTE — Telephone Encounter (Signed)
ERROR

## 2021-06-28 ENCOUNTER — Ambulatory Visit (INDEPENDENT_AMBULATORY_CARE_PROVIDER_SITE_OTHER): Payer: Medicare Other

## 2021-06-28 VITALS — Ht 64.0 in | Wt 126.0 lb

## 2021-06-28 DIAGNOSIS — Z0000 Encounter for general adult medical examination without abnormal findings: Secondary | ICD-10-CM

## 2021-06-28 DIAGNOSIS — G43919 Migraine, unspecified, intractable, without status migrainosus: Secondary | ICD-10-CM | POA: Insufficient documentation

## 2021-06-28 DIAGNOSIS — R203 Hyperesthesia: Secondary | ICD-10-CM | POA: Insufficient documentation

## 2021-06-28 DIAGNOSIS — K2100 Gastro-esophageal reflux disease with esophagitis, without bleeding: Secondary | ICD-10-CM | POA: Insufficient documentation

## 2021-06-28 NOTE — Patient Instructions (Signed)
Christina Silva , Thank you for taking time to come for your Medicare Wellness Visit. I appreciate your ongoing commitment to your health goals. Please review the following plan we discussed and let me know if I can assist you in the future.   Screening recommendations/referrals: Colonoscopy: No longer required Mammogram: No longer required Bone Density: Done 05/18/2015 - Repeat every 2 years *due Recommended yearly ophthalmology/optometry visit for glaucoma screening and checkup Recommended yearly dental visit for hygiene and checkup  Vaccinations: Influenza vaccine: Done 06/14/2021 - Repeat annually Pneumococcal vaccine: done 05/24/2015 & 05/29/2019 Tdap vaccine: Done 12/19/2010 - Repeat in 10 years Shingles vaccine: Done 06/30/2019 & 11/18/2019    Covid-19: Done 09/09/19, 10/10/19, & 07/13/20  Advanced directives: Please bring a copy of your health care power of attorney and living will to the office to be added to your chart at your convenience.   Conditions/risks identified: Aim for 30 minutes of exercise or brisk walking each day, drink 6-8 glasses of water and eat lots of fruits and vegetables.   Next appointment: Follow up in one year for your annual wellness visit    Preventive Care 65 Years and Older, Female Preventive care refers to lifestyle choices and visits with your health care provider that can promote health and wellness. What does preventive care include? A yearly physical exam. This is also called an annual well check. Dental exams once or twice a year. Routine eye exams. Ask your health care provider how often you should have your eyes checked. Personal lifestyle choices, including: Daily care of your teeth and gums. Regular physical activity. Eating a healthy diet. Avoiding tobacco and drug use. Limiting alcohol use. Practicing safe sex. Taking low-dose aspirin every day. Taking vitamin and mineral supplements as recommended by your health care provider. What happens  during an annual well check? The services and screenings done by your health care provider during your annual well check will depend on your age, overall health, lifestyle risk factors, and family history of disease. Counseling  Your health care provider may ask you questions about your: Alcohol use. Tobacco use. Drug use. Emotional well-being. Home and relationship well-being. Sexual activity. Eating habits. History of falls. Memory and ability to understand (cognition). Work and work Statistician. Reproductive health. Screening  You may have the following tests or measurements: Height, weight, and BMI. Blood pressure. Lipid and cholesterol levels. These may be checked every 5 years, or more frequently if you are over 26 years old. Skin check. Lung cancer screening. You may have this screening every year starting at age 24 if you have a 30-pack-year history of smoking and currently smoke or have quit within the past 15 years. Fecal occult blood test (FOBT) of the stool. You may have this test every year starting at age 74. Flexible sigmoidoscopy or colonoscopy. You may have a sigmoidoscopy every 5 years or a colonoscopy every 10 years starting at age 53. Hepatitis C blood test. Hepatitis B blood test. Sexually transmitted disease (STD) testing. Diabetes screening. This is done by checking your blood sugar (glucose) after you have not eaten for a while (fasting). You may have this done every 1-3 years. Bone density scan. This is done to screen for osteoporosis. You may have this done starting at age 74. Mammogram. This may be done every 1-2 years. Talk to your health care provider about how often you should have regular mammograms. Talk with your health care provider about your test results, treatment options, and if necessary, the need for  more tests. Vaccines  Your health care provider may recommend certain vaccines, such as: Influenza vaccine. This is recommended every  year. Tetanus, diphtheria, and acellular pertussis (Tdap, Td) vaccine. You may need a Td booster every 10 years. Zoster vaccine. You may need this after age 5. Pneumococcal 13-valent conjugate (PCV13) vaccine. One dose is recommended after age 41. Pneumococcal polysaccharide (PPSV23) vaccine. One dose is recommended after age 71. Talk to your health care provider about which screenings and vaccines you need and how often you need them. This information is not intended to replace advice given to you by your health care provider. Make sure you discuss any questions you have with your health care provider. Document Released: 09/10/2015 Document Revised: 05/03/2016 Document Reviewed: 06/15/2015 Elsevier Interactive Patient Education  2017 [place] Prevention in the Home Falls can cause injuries. They can happen to people of all ages. There are many things you can do to make your home safe and to help prevent falls. What can I do on the outside of my home? Regularly fix the edges of walkways and driveways and fix any cracks. Remove anything that might make you trip as you walk through a door, such as a raised step or threshold. Trim any bushes or trees on the path to your home. Use bright outdoor lighting. Clear any walking paths of anything that might make someone trip, such as rocks or tools. Regularly check to see if handrails are loose or broken. Make sure that both sides of any steps have handrails. Any raised decks and porches should have guardrails on the edges. Have any leaves, snow, or ice cleared regularly. Use sand or salt on walking paths during winter. Clean up any spills in your garage right away. This includes oil or grease spills. What can I do in the bathroom? Use night lights. Install grab bars by the toilet and in the tub and shower. Do not use towel bars as grab bars. Use non-skid mats or decals in the tub or shower. If you need to sit down in the shower, use a  plastic, non-slip stool. Keep the floor dry. Clean up any water that spills on the floor as soon as it happens. Remove soap buildup in the tub or shower regularly. Attach bath mats securely with double-sided non-slip rug tape. Do not have throw rugs and other things on the floor that can make you trip. What can I do in the bedroom? Use night lights. Make sure that you have a light by your bed that is easy to reach. Do not use any sheets or blankets that are too big for your bed. They should not hang down onto the floor. Have a firm chair that has side arms. You can use this for support while you get dressed. Do not have throw rugs and other things on the floor that can make you trip. What can I do in the kitchen? Clean up any spills right away. Avoid walking on wet floors. Keep items that you use a lot in easy-to-reach places. If you need to reach something above you, use a strong step stool that has a grab bar. Keep electrical cords out of the way. Do not use floor polish or wax that makes floors slippery. If you must use wax, use non-skid floor wax. Do not have throw rugs and other things on the floor that can make you trip. What can I do with my stairs? Do not leave any items on the stairs. Make  sure that there are handrails on both sides of the stairs and use them. Fix handrails that are broken or loose. Make sure that handrails are as long as the stairways. Check any carpeting to make sure that it is firmly attached to the stairs. Fix any carpet that is loose or worn. Avoid having throw rugs at the top or bottom of the stairs. If you do have throw rugs, attach them to the floor with carpet tape. Make sure that you have a light switch at the top of the stairs and the bottom of the stairs. If you do not have them, ask someone to add them for you. What else can I do to help prevent falls? Wear shoes that: Do not have high heels. Have rubber bottoms. Are comfortable and fit you  well. Are closed at the toe. Do not wear sandals. If you use a stepladder: Make sure that it is fully opened. Do not climb a closed stepladder. Make sure that both sides of the stepladder are locked into place. Ask someone to hold it for you, if possible. Clearly mark and make sure that you can see: Any grab bars or handrails. First and last steps. Where the edge of each step is. Use tools that help you move around (mobility aids) if they are needed. These include: Canes. Walkers. Scooters. Crutches. Turn on the lights when you go into a dark area. Replace any light bulbs as soon as they burn out. Set up your furniture so you have a clear path. Avoid moving your furniture around. If any of your floors are uneven, fix them. If there are any pets around you, be aware of where they are. Review your medicines with your doctor. Some medicines can make you feel dizzy. This can increase your chance of falling. Ask your doctor what other things that you can do to help prevent falls. This information is not intended to replace advice given to you by your health care provider. Make sure you discuss any questions you have with your health care provider. Document Released: 06/10/2009 Document Revised: 01/20/2016 Document Reviewed: 09/18/2014 Elsevier Interactive Patient Education  2017 [employer]   Managing Anxiety, Adult After being diagnosed with an anxiety disorder, you may be relieved to know why you have felt or behaved a certain way. You may also feel overwhelmed about the treatment ahead and what it will mean for your life. With care and support, you can manage this condition and recover from it. How to manage lifestyle changes Managing stress and anxiety Stress is your body's reaction to life changes and events, both good and bad. Most stress will last just a few hours, but stress can be ongoing and can lead to more than just stress. Although stress can play a major role in anxiety, it  is not the same as anxiety. Stress is usually caused by something external, such as a deadline, test, or competition. Stress normally passes after the triggering event has ended.  Anxiety is caused by something internal, such as imagining a terrible outcome or worrying that something will go wrong that will devastate you. Anxiety often does not go away even after the triggering event is over, and it can become long-term (chronic) worry. It is important to understand the differences between stress and anxiety and to manage your stress effectively so that it does not lead to an anxious response. Talk with your health care provider or a counselor to learn more about reducing anxiety and stress. He or  she may suggest tension reduction techniques, such as: Music therapy. This can include creating or listening to music that you enjoy and that inspires you. Mindfulness-based meditation. This involves being aware of your normal breaths while not trying to control your breathing. It can be done while sitting or walking. Centering prayer. This involves focusing on a word, phrase, or sacred image that means something to you and brings you peace. Deep breathing. To do this, expand your stomach and inhale slowly through your nose. Hold your breath for 3-5 seconds. Then exhale slowly, letting your stomach muscles relax. Self-talk. This involves identifying thought patterns that lead to anxiety reactions and changing those patterns. Muscle relaxation. This involves tensing muscles and then relaxing them. Choose a tension reduction technique that suits your lifestyle and personality. These techniques take time and practice. Set aside 5-15 minutes a day to do them. Therapists can offer counseling and training in these techniques. The training to help with anxiety may be covered by some insurance plans. Other things you can do to manage stress and anxiety include: Keeping a stress/anxiety diary. This can help you learn what  triggers your reaction and then learn ways to manage your response. Thinking about how you react to certain situations. You may not be able to control everything, but you can control your response. Making time for activities that help you relax and not feeling guilty about spending your time in this way. Visual imagery and yoga can help you stay calm and relax.  Medicines Medicines can help ease symptoms. Medicines for anxiety include: Anti-anxiety drugs. Antidepressants. Medicines are often used as a primary treatment for anxiety disorder. Medicines will be prescribed by a health care provider. When used together, medicines, psychotherapy, and tension reduction techniques may be the most effective treatment. Relationships Relationships can play a big part in helping you recover. Try to spend more time connecting with trusted friends and family members. Consider going to couples counseling, taking family education classes, or going to family therapy. Therapy can help you and others better understand your condition. How to recognize changes in your anxiety Everyone responds differently to treatment for anxiety. Recovery from anxiety happens when symptoms decrease and stop interfering with your daily activities at home or work. This may mean that you will start to: Have better concentration and focus. Worry will interfere less in your daily thinking. Sleep better. Be less irritable. Have more energy. Have improved memory. It is important to recognize when your condition is getting worse. Contact your health care provider if your symptoms interfere with home or work and you feel like your condition is not improving. Follow these instructions at home: Activity Exercise. Most adults should do the following: Exercise for at least 150 minutes each week. The exercise should increase your heart rate and make you sweat (moderate-intensity exercise). Strengthening exercises at least twice a week. Get  the right amount and quality of sleep. Most adults need 7-9 hours of sleep each night. Lifestyle  Eat a healthy diet that includes plenty of vegetables, fruits, whole grains, low-fat dairy products, and lean protein. Do not eat a lot of foods that are high in solid fats, added sugars, or salt. Make choices that simplify your life. Do not use any products that contain nicotine or tobacco, such as cigarettes, e-cigarettes, and chewing tobacco. If you need help quitting, ask your health care provider. Avoid caffeine, alcohol, and certain over-the-counter cold medicines. These may make you feel worse. Ask your pharmacist which medicines to avoid.  General instructions Take over-the-counter and prescription medicines only as told by your health care provider. Keep all follow-up visits as told by your health care provider. This is important. Where to find support You can get help and support from these sources: Self-help groups. Online and [employer]. A trusted spiritual leader. Couples counseling. Family education classes. Family therapy. Where to find more information You may find that joining a support group helps you deal with your anxiety. The following sources can help you locate counselors or support groups near you: [place]: www.mentalhealth[place].net Anxiety and Depression Association of [place] (ADAA): https://www.clark.net/ National Alliance on Mental Illness (NAMI): www.nami.org Contact a health care provider if you: Have a hard time staying focused or finishing daily tasks. Spend many hours a day feeling worried about everyday life. Become exhausted by worry. Start to have headaches, feel tense, or have nausea. Urinate more than normal. Have diarrhea. Get help right away if you have: A racing heart and shortness of breath. Thoughts of hurting yourself or others. If you ever feel like you may hurt yourself or others, or have thoughts about taking your own life,  get help right away. You can go to your nearest emergency department or call: Your local emergency services (911 in the [place] A suicide crisis helpline, such as the [place] at 563 524 6664. This is open 24 hours a day. Summary Taking steps to learn and use tension reduction techniques can help calm you and help prevent triggering an anxiety reaction. When used together, medicines, psychotherapy, and tension reduction techniques may be the most effective treatment. Family, friends, and partners can play a big part in helping you recover from an anxiety disorder. This information is not intended to replace advice given to you by your health care provider. Make sure you discuss any questions you have with your health care provider. Document Revised: 01/14/2019 Document Reviewed: 01/14/2019 Elsevier Patient Education  [place].

## 2021-06-28 NOTE — Progress Notes (Signed)
Subjective:   Christina Silva is a 87 y.o. female who presents for Medicare Annual (Subsequent) preventive examination.  Virtual Visit via Telephone Note  I connected with  Christina Silva on 06/28/21 at  2:00 PM EDT by telephone and verified that I am speaking with the correct person using two identifiers.  Location: Patient: Home Provider: [facility] Persons participating in the virtual visit: patient/Nurse Health Advisor   I discussed the limitations, risks, security and privacy concerns of performing an evaluation and management service by telephone and the availability of in person appointments. The patient expressed understanding and agreed to proceed.  Interactive audio and video telecommunications were attempted between this nurse and patient, however failed, due to patient having technical difficulties OR patient did not have access to video capability.  We continued and completed visit with audio only.  Some vital signs may be absent or patient reported.   Christina Stadel E Jaxxen Voong, LPN   Review of Systems     Cardiac Risk Factors include: advanced age (>78men, >75 women);dyslipidemia;hypertension;sedentary lifestyle;Other (see comment), Risk factor comments: hx of stroke     Objective:    Today's Vitals   06/28/21 1402  Weight: 126 lb (57.2 kg)  Height: 5\' 4"  (1.626 m)   Body mass index is 21.63 kg/m.  Advanced Directives 06/28/2021 06/03/2020 12/16/2019 12/12/2019 05/01/2019 05/01/2019 03/19/2019  Does Patient Have a Medical Advance Directive? Yes Yes - Yes Yes Yes Yes  Type of Advance Directive [place];Living will [place];Living will - [place];Living will Living will;Healthcare Power of [place];Living will  Does patient want to make changes to medical advance directive? - No - Patient declined - No - Patient declined No - Patient declined - -  Copy of [place] in Chart?  No - copy requested No - copy requested No - copy requested No - copy requested No - copy requested - No - copy requested  Would patient like information on creating a medical advance directive? - - - - - - No - Patient declined    Current Medications (verified) Outpatient Encounter Medications as of 06/28/2021  Medication Sig   amLODipine (NORVASC) 2.5 MG tablet Take 1 tablet (2.5 mg total) by mouth every evening.   aspirin EC 81 MG tablet Take 81 mg by mouth daily. Swallow whole.   cholecalciferol (VITAMIN D) 25 MCG (1000 UNIT) tablet Take 2,000 Units by mouth daily.   Coenzyme Q10 (CO Q 10 PO) Take by mouth.   diazepam (VALIUM) 2 MG tablet TAKE ONE TABLET TWICE DAILY AS NEEDED   losartan (COZAAR) 100 MG tablet TAKE ONE (1) TABLET EACH DAY   pantoprazole (PROTONIX) 40 MG tablet Take 1 tablet (40 mg total) by mouth daily.   rosuvastatin (CRESTOR) 20 MG tablet Take 1 tablet (20 mg total) by mouth daily.   topiramate (TOPAMAX) 50 MG tablet Take 1 tablet (50 mg total) by mouth 2 (two) times daily.   Ubiquinone (ULTRA COQ10 PO) Take 100 mg by mouth in the morning and at bedtime.   Ubrogepant (UBRELVY) 100 MG TABS Take 100 mg by mouth as needed (Acute onset of migraine headache. repeat x1 >2 hrs if needed. Max dose 200mg Bailey Mech).   vitamin C (ASCORBIC ACID) 500 MG tablet Take 500 mg by mouth daily.   No facility-administered encounter medications on file as of 06/28/2021.    Allergies (verified) Codeine, Lisinopril, Morphine, Other, Sulfa antibiotics, and Sulfonamide derivatives  History: Past Medical History:  Diagnosis Date   Anxiety    Cataract    GERD (gastroesophageal reflux disease)    Hyperlipidemia    Hypertension    SCCA (squamous cell carcinoma) of skin 11/02/2020   Mid Chest (in situ) (tx p bx)   SCCA (squamous cell carcinoma) of skin 11/02/2020   Right Forearm Posterior (in situ) (tx p bx)   SCCA (squamous cell carcinoma) of skin 11/02/2020   Left Submandibular Area (in  situ) (tx p bx)   Stroke ([place]) 09/2018   Vertigo    patient denies   Past Surgical History:  Procedure Laterality Date   ABDOMINAL HYSTERECTOMY  1980   CATARACT EXTRACTION Right    CHOLECYSTECTOMY N/A 12/16/2019   Procedure: LAPAROSCOPIC CHOLECYSTECTOMY WITH INTRAOPERATIVE CHOLANGIOGRAM;  Surgeon: Erroll Luna, MD;  Location: [facility] OR;  Service: General;  Laterality: N/A;   EYE SURGERY     implantable loop recorder placement  11/12/2020    Medtronic Reveal Linq model [id number] implantable loop recorder (SN [id number]) implanted by Dr Rayann Heman for cryptogenic stroke   KNEE ARTHROSCOPY Left    ROTATOR CUFF REPAIR Left    SQUAMOUS CELL CARCINOMA EXCISION Left    TONSILLECTOMY     Family History  Problem Relation Age of Onset   Heart attack Mother    Hyperlipidemia Mother    Heart disease Mother    Transient ischemic attack Father    Stroke Father    Psychiatric Illness Sister    Heart disease Sister    Arthritis Brother    Epilepsy Brother    Colon cancer Neg Hx    Esophageal cancer Neg Hx    Rectal cancer Neg Hx    Stomach cancer Neg Hx    Social History   Socioeconomic History   Marital status: Widowed    Spouse name: Not on file   Number of children: 2   Years of education: 10   Highest education level: Not on file  Occupational History   Occupation: retired  Tobacco Use   Smoking status: Former    Types: Cigarettes    Quit date: 01/09/1983    Years since quitting: 38.4   Smokeless tobacco: Never  Vaping Use   Vaping Use: Never used  Substance and Sexual Activity   Alcohol use: No   Drug use: No   Sexual activity: Yes  Other Topics Concern   Not on file  Social History Narrative   Lives alone handicap accessible home - daughter in [place] and son in [place].   Right Handed   Drinks caffeine occassionally   Social Determinants of Health   Financial Resource Strain: Low Risk    Difficulty of Paying Living Expenses: Not hard at all  Food Insecurity: No  Food Insecurity   Worried About Charity fundraiser in the Last Year: Never true   Ran Out of Food in the Last Year: Never true  Transportation Needs: No Transportation Needs   Lack of Transportation (Medical): No   Lack of Transportation (Non-Medical): No  Physical Activity: Insufficiently Active   Days of Exercise per Week: 6 days   Minutes of Exercise per Session: 20 min  Stress: Stress Concern Present   Feeling of Stress : Rather much  Social Connections: Moderately Integrated   Frequency of Communication with Friends and Family: More than three times a week   Frequency of Social Gatherings with Friends and Family: More than three times a week   Attends Religious Services: 1 to  4 times per year   Active Member of Clubs or Organizations: Yes   Attends Archivist Meetings: 1 to 4 times per year   Marital Status: Widowed    Tobacco Counseling Counseling given: Not Answered   Clinical Intake:  Pre-visit preparation completed: Yes  Pain : No/denies pain     BMI - recorded: 21.63 Nutritional Status: BMI of 19-24  Normal Nutritional Risks: None Diabetes: No  How often do you need to have someone help you when you read instructions, pamphlets, or other written materials from your doctor or pharmacy?: 1 - Never  Diabetic? no  Interpreter Needed?: No  Information entered by :: Christina Fontanilla, LPN   Activities of Daily Living In your present state of health, do you have any difficulty performing the following activities: 06/28/2021  Hearing? N  Comment patient declines - but has difficulty hearing me over the phone today  Vision? N  Comment only when having episodes of diplopia and headaches  Difficulty concentrating or making decisions? N  Walking or climbing stairs? N  Dressing or bathing? N  Doing errands, shopping? Y  Comment intermittent blurred vision with headaches, so has her daughter drive her - too nervous to drive anymore  Preparing Food and eating ?  N  Using the Toilet? N  In the past six months, have you accidently leaked urine? N  Do you have problems with loss of bowel control? N  Managing your Medications? N  Managing your Finances? N  Housekeeping or managing your Housekeeping? N  Comment she pays someone to deep clean every other month  Some recent data might be hidden    Patient Care Team: [name], FNP as PCP - General (Nurse Practitioner) [place], Peter M, MD as PCP - Cardiology (Cardiology) Phillips Odor, MD as Consulting Physician (Neurology) [place], Peter M, MD as Consulting Physician (Cardiology) Warren Danes, PA-C as Physician Assistant (Dermatology)  Indicate any recent Medical Services you may have received from other than [facility] providers in the past year (date may be approximate).     Assessment:   This is a routine wellness examination for Christina Silva.  Hearing/Vision screen Hearing Screening - Comments:: Denies hearing difficulties - has some difficulty hearing me over the phone Vision Screening - Comments:: Wears rx glasses - up to date with annual eye exams with Dr Warden Fillers  Dietary issues and exercise activities discussed: Current Exercise Habits: Home exercise routine, Type of exercise: walking, Time (Minutes): 20, Frequency (Times/Week): 6, Weekly Exercise (Minutes/Week): 120, Intensity: Mild, Exercise limited by: orthopedic condition(s);psychological condition(s)   Goals Addressed             This Visit's Progress    Prevent falls   On track    Study surroundings carefully       Depression Screen PHQ 2/9 Scores 06/28/2021 06/14/2021 02/09/2021 09/02/2020 06/03/2020 03/23/2020 09/24/2019  PHQ - 2 Score 2 2 1  0 2 0 0  PHQ- 9 Score 8 6 3  - 3 - -    Fall Risk Fall Risk  06/28/2021 02/09/2021 09/02/2020 06/03/2020 03/23/2020  Falls in the past year? 0 0 0 0 0  Number falls in past yr: 0 - - - -  Injury with Fall? 0 - - - -  Risk for fall due to : Orthopedic patient;Medication  side effect - - - -  Follow up Falls prevention discussed;Education provided - - - -    FALL RISK PREVENTION PERTAINING TO THE HOME:  Any stairs in  or around the home? No  If so, are there any without handrails? No  Home free of loose throw rugs in walkways, pet beds, electrical cords, etc? Yes  Adequate lighting in your home to reduce risk of falls? Yes   ASSISTIVE DEVICES UTILIZED TO PREVENT FALLS:  Life alert? No  Use of a cane, walker or w/c? No  Grab bars in the bathroom? Yes  Shower chair or bench in shower? Yes  Elevated toilet seat or a handicapped toilet? Yes   TIMED UP AND GO:  Was the test performed? No . Telephonic visit  Cognitive Function: MMSE - Mini Mental State Exam 05/24/2015  Orientation to time 5  Orientation to Place 5  Registration 3  Attention/ Calculation 5  Recall 3  Language- name 2 objects 2  Language- repeat 1  Language- follow 3 step command 3  Language- read & follow direction 1  Write a sentence 1  Copy design 1  Total score 30     6CIT Screen 06/28/2021 06/03/2020 05/01/2019  What Year? 0 points 0 points 0 points  What month? 0 points 0 points 0 points  What time? 0 points 0 points 0 points  Count back from 20 0 points 0 points 0 points  Months in reverse 2 points 2 points 0 points  Repeat phrase 2 points 2 points 0 points  Total Score 4 4 0    Immunizations Immunization History  Administered Date(s) Administered   Fluad Quad(high Dose 65+) 05/29/2019, 08/16/2020, 06/14/2021   Influenza, High Dose Seasonal PF 10/03/2016, 09/03/2017, 06/10/2018   Influenza,inj,Quad PF,6+ Mos 05/24/2015   Influenza-Unspecified 07/18/2018   Moderna Sars-Covid-2 Vaccination 09/09/2019, 10/10/2019, 07/13/2020   Pneumococcal Conjugate-13 05/24/2015   Pneumococcal Polysaccharide-23 05/29/2019   Tdap 12/19/2010   Zoster Recombinat (Shingrix) 06/30/2019, 11/18/2019    TDAP status: Due, Education has been provided regarding the importance of this  vaccine. Advised may receive this vaccine at local pharmacy or Health Dept. Aware to provide a copy of the vaccination record if obtained from local pharmacy or Health Dept. Verbalized acceptance and understanding.  Flu Vaccine status: Up to date  Pneumococcal vaccine status: Up to date  Covid-19 vaccine status: Completed vaccines  Qualifies for Shingles Vaccine? Yes   Zostavax completed Yes   Shingrix Completed?: Yes  Screening Tests Health Maintenance  Topic Date Due   COVID-19 Vaccine (4 - Booster for Moderna series) 06/30/2021 (Originally 09/07/2020)   TETANUS/TDAP  02/09/2022 (Originally 12/18/2020)   Pneumonia Vaccine 38+ Years old  Completed   INFLUENZA VACCINE  Completed   DEXA SCAN  Completed   Zoster Vaccines- Shingrix  Completed   HPV VACCINES  Aged Out    Health Maintenance  There are no preventive care reminders to display for this patient.  Colorectal cancer screening: No longer required.   Mammogram status: No longer required due to age.  Bone Density status: Completed 05/18/2015. Results reflect: Bone density results: OSTEOPENIA. Repeat every 2 years.  Lung Cancer Screening: (Low Dose CT Chest recommended if Age 30-80 years, 30 pack-year currently smoking OR have quit w/in 15years.) does not qualify.  Additional Screening:  Hepatitis C Screening: does not qualify  Vision Screening: Recommended annual ophthalmology exams for early detection of glaucoma and other disorders of the eye. Is the patient up to date with their annual eye exam?  Yes  Who is the provider or what is the name of the office in which the patient attends annual eye exams? [facility] If pt is not  established with a provider, would they like to be referred to a provider to establish care? No .   Dental Screening: Recommended annual dental exams for proper oral hygiene  Community Resource Referral / Chronic Care Management: CRR required this visit?  No   CCM required this visit?   No      Plan:     I have personally reviewed and noted the following in the patient's chart:   Medical and social history Use of alcohol, tobacco or illicit drugs  Current medications and supplements including opioid prescriptions.  Functional ability and status Nutritional status Physical activity Advanced directives List of other physicians Hospitalizations, surgeries, and ER visits in previous 12 months Vitals Screenings to include cognitive, depression, and falls Referrals and appointments  In addition, I have reviewed and discussed with patient certain preventive protocols, quality metrics, and best practice recommendations. A written personalized care plan for preventive services as well as general preventive health recommendations were provided to patient.     Christina Hammond, LPN   56/03/1274   Nurse Notes: Has appt tomorrow to discuss her anxiety - since her stroke, she is increasingly anxious - doesn't feel that diazepam is helping much anymore - she gets headaches and blurred vision, so is afraid to drive and just has an uneasy feeling like something bad is going to happen - worries constantly, doesn't have an appetite, sleeping is mostly okay.

## 2021-06-29 ENCOUNTER — Ambulatory Visit (INDEPENDENT_AMBULATORY_CARE_PROVIDER_SITE_OTHER): Payer: Medicare Other | Admitting: Nurse Practitioner

## 2021-06-29 ENCOUNTER — Encounter: Payer: Self-pay | Admitting: Nurse Practitioner

## 2021-06-29 ENCOUNTER — Other Ambulatory Visit: Payer: Self-pay

## 2021-06-29 VITALS — BP 119/68 | HR 81 | Temp 97.6°F | Resp 20 | Ht 64.0 in | Wt 127.0 lb

## 2021-06-29 DIAGNOSIS — F411 Generalized anxiety disorder: Secondary | ICD-10-CM

## 2021-06-29 DIAGNOSIS — F321 Major depressive disorder, single episode, moderate: Secondary | ICD-10-CM | POA: Diagnosis not present

## 2021-06-29 MED ORDER — CLONAZEPAM 0.5 MG PO TABS: 0.5000 mg | ORAL_TABLET | Freq: Two times a day (BID) | ORAL | 1 refills | Status: DC | PRN

## 2021-06-29 MED ORDER — ESCITALOPRAM OXALATE 10 MG PO TABS
10.0000 mg | ORAL_TABLET | Freq: Every day | ORAL | 2 refills | Status: DC
Start: 2021-06-29 — End: 2021-08-11

## 2021-06-29 NOTE — Patient Instructions (Signed)
Generalized Anxiety Disorder, Adult Generalized anxiety disorder (GAD) is a mental health condition. Unlike normal worries, anxiety related to GAD is not triggered by a specific event. These worries do not fade or get better with time. GAD interferes with relationships, work, and school. GAD symptoms can vary from mild to severe. People with severe GAD can have intense waves of anxiety with physical symptoms that are similar to panic attacks. What are the causes? The exact cause of GAD is not known, but the following are believed to have an impact: Differences in natural brain chemicals. Genes passed down from parents to children. Differences in the way threats are perceived. Development during childhood. Personality. What increases the risk? The following factors may make you more likely to develop this condition: Being female. Having a family history of anxiety disorders. Being very shy. Experiencing very stressful life events, such as the death of a loved one. Having a very stressful family environment. What are the signs or symptoms? People with GAD often worry excessively about many things in their lives, such as their health and family. Symptoms may also include: Mental and emotional symptoms: Worrying excessively about natural disasters. Fear of being late. Difficulty concentrating. Fears that others are judging your performance. Physical symptoms: Fatigue. Headaches, muscle tension, muscle twitches, trembling, or feeling shaky. Feeling like your heart is pounding or beating very fast. Feeling out of breath or like you cannot take a deep breath. Having trouble falling asleep or staying asleep, or experiencing restlessness. Sweating. Nausea, diarrhea, or irritable bowel syndrome (IBS). Behavioral symptoms: Experiencing erratic moods or irritability. Avoidance of new situations. Avoidance of people. Extreme difficulty making decisions. How is this diagnosed? This condition  is diagnosed based on your symptoms and medical history. You will also have a physical exam. Your health care provider may perform tests to rule out other possible causes of your symptoms. To be diagnosed with GAD, a person must have anxiety that: Is out of his or her control. Affects several different aspects of his or her life, such as work and relationships. Causes distress that makes him or her unable to take part in normal activities. Includes at least three symptoms of GAD, such as restlessness, fatigue, trouble concentrating, irritability, muscle tension, or sleep problems. Before your health care provider can confirm a diagnosis of GAD, these symptoms must be present more days than they are not, and they must last for 6 months or longer. How is this treated? This condition may be treated with: Medicine. Antidepressant medicine is usually prescribed for long-term daily control. Anti-anxiety medicines may be added in severe cases, especially when panic attacks occur. Talk therapy (psychotherapy). Certain types of talk therapy can be helpful in treating GAD by providing support, education, and guidance. Options include: Cognitive behavioral therapy (CBT). People learn coping skills and self-calming techniques to ease their physical symptoms. They learn to identify unrealistic thoughts and behaviors and to replace them with more appropriate thoughts and behaviors. Acceptance and commitment therapy (ACT). This treatment teaches people how to be mindful as a way to cope with unwanted thoughts and feelings. Biofeedback. This process trains you to manage your body's response (physiological response) through breathing techniques and relaxation methods. You will work with a therapist while machines are used to monitor your physical symptoms. Stress management techniques. These include yoga, meditation, and exercise. A mental health specialist can help determine which treatment is best for you. Some  people see improvement with one type of therapy. However, other people require a combination   of therapies. Follow these instructions at home: Lifestyle Maintain a consistent routine and schedule. Anticipate stressful situations. Create a plan, and allow extra time to work with your plan. Practice stress management or self-calming techniques that you have learned from your therapist or your health care provider. General instructions Take over-the-counter and prescription medicines only as told by your health care provider. Understand that you are likely to have setbacks. Accept this and be kind to yourself as you persist to take better care of yourself. Recognize and accept your accomplishments, even if you judge them as small. Keep all follow-up visits as told by your health care provider. This is important. Contact a health care provider if: Your symptoms do not get better. Your symptoms get worse. You have signs of depression, such as: A persistently sad or irritable mood. Loss of enjoyment in activities that used to bring you joy. Change in weight or eating. Changes in sleeping habits. Avoiding friends or family members. Loss of energy for normal tasks. Feelings of guilt or worthlessness. Get help right away if: You have serious thoughts about hurting yourself or others. If you ever feel like you may hurt yourself or others, or have thoughts about taking your own life, get help right away. Go to your nearest emergency department or: Call your local emergency services (911 in the [place] Call a suicide crisis helpline, such as the National Suicide Prevention Lifeline at 1-800-273-8255. This is open 24 hours a day in the [place] Text the Crisis Text Line at 741741 (in the [place] Summary Generalized anxiety disorder (GAD) is a mental health condition that involves worry that is not triggered by a specific event. People with GAD often worry excessively about many things in their lives, such  as their health and family. GAD may cause symptoms such as restlessness, trouble concentrating, sleep problems, frequent sweating, nausea, diarrhea, headaches, and trembling or muscle twitching. A mental health specialist can help determine which treatment is best for you. Some people see improvement with one type of therapy. However, other people require a combination of therapies. This information is not intended to replace advice given to you by your health care provider. Make sure you discuss any questions you have with your health care provider. Document Revised: 06/04/2019 Document Reviewed: 06/04/2019 Elsevier Patient Education  2022 [employer]  

## 2021-06-29 NOTE — Progress Notes (Signed)
Subjective:    Patient ID: Christina Silva, female    DOB: May 22, 1933, 87 y.o.   MRN: [id number]   Chief Complaint: Anxiety   HPI Patient come sin with her daughter c/o anxiety. She says she worries al the time. Does not feel like doing anything.  Started several months ago. Cannot relax and does not like being by herself. GAD 7 : Generalized Anxiety Score 06/29/2021 06/14/2021 02/09/2021 03/23/2020  Nervous, Anxious, on Edge 3 1 1 1   Control/stop worrying 3 1 1  0  Worry too much - different things 3 1 1  0  Trouble relaxing 3 1 0 0  Restless 2 0 0 0  Easily annoyed or irritable 1 0 0 0  Afraid - awful might happen 3 1 0 0  Total GAD 7 Score 18 5 3 1   Anxiety Difficulty Not difficult at all Not difficult at all Not difficult at all Not difficult at all   Depression screen [facility] 2/9 06/29/2021 06/28/2021 06/14/2021  Decreased Interest 3 2 2   Down, Depressed, Hopeless 3 0 0  PHQ - 2 Score 6 2 2   Altered sleeping 2 1 0  Tired, decreased energy 3 2 2   Change in appetite 3 2 1   Feeling bad or failure about yourself  0 0 0  Trouble concentrating 0 1 0  Moving slowly or fidgety/restless 0 0 1  Suicidal thoughts 0 0 0  PHQ-9 Score 14 8 6   Difficult doing work/chores Not difficult at all Somewhat difficult Not difficult at all  Some recent data might be hidden        Review of Systems  Constitutional:  Negative for diaphoresis.  Eyes:  Negative for pain.  Respiratory:  Negative for shortness of breath.   Cardiovascular:  Negative for chest pain, palpitations and leg swelling.  Gastrointestinal:  Negative for abdominal pain.  Endocrine: Negative for polydipsia.  Skin:  Negative for rash.  Neurological:  Negative for dizziness, weakness and headaches.  Hematological:  Does not bruise/bleed easily.  All other systems reviewed and are negative.     Objective:   Physical Exam Vitals and nursing note reviewed.  Constitutional:      General: She is not in acute distress.     Appearance: Normal appearance. She is well-developed.  Neck:     Vascular: No carotid bruit or JVD.  Cardiovascular:     Rate and Rhythm: Normal rate and regular rhythm.     Heart sounds: Normal heart sounds.  Pulmonary:     Effort: Pulmonary effort is normal. No respiratory distress.     Breath sounds: Normal breath sounds. No wheezing or rales.  Chest:     Chest wall: No tenderness.  Abdominal:     General: Bowel sounds are normal. There is no distension or abdominal bruit.     Palpations: Abdomen is soft. There is no hepatomegaly, splenomegaly, mass or pulsatile mass.     Tenderness: There is no abdominal tenderness.  Musculoskeletal:        General: Normal range of motion.     Cervical back: Normal range of motion and neck supple.  Lymphadenopathy:     Cervical: No cervical adenopathy.  Skin:    General: Skin is warm and dry.  Neurological:     Mental Status: She is alert and oriented to person, place, and time.     Deep Tendon Reflexes: Reflexes are normal and symmetric.  Psychiatric:        Behavior: Behavior normal.  Thought Content: Thought content normal.        Judgment: Judgment normal.    BP 119/68   Pulse 81   Temp 97.6 F (36.4 C) (Temporal)   Resp 20   Ht 5\' 4"  (1.626 m)   Wt 127 lb (57.6 kg)   SpO2 96%   BMI 21.80 kg/m        Assessment & Plan:   Christina Silva in today with chief complaint of Anxiety   1. GAD (generalized anxiety disorder) Stress management Stop valium - clonazePAM (KLONOPIN) 0.5 MG tablet; Take 1 tablet (0.5 mg total) by mouth 2 (two) times daily as needed for anxiety.  Dispense: 60 tablet; Refill: 1  2. Depression, major, single episode, moderate (HCC) Stress management Side effects of meds discussed - escitalopram (LEXAPRO) 10 MG tablet; Take 1 tablet (10 mg total) by mouth daily.  Dispense: 30 tablet; Refill: 2    The above assessment and management plan was discussed with the patient. The patient verbalized  understanding of and has agreed to the management plan. Patient is aware to call the clinic if symptoms persist or worsen. Patient is aware when to return to the clinic for a follow-up visit. Patient educated on when it is appropriate to go to the emergency department.   Mary-Margaret Hassell Done, FNP

## 2021-07-05 LAB — CUP PACEART REMOTE DEVICE CHECK
Date Time Interrogation Session: 20221108132833
Implantable Pulse Generator Implant Date: 20220322

## 2021-07-06 ENCOUNTER — Telehealth: Payer: Self-pay | Admitting: Nurse Practitioner

## 2021-07-06 DIAGNOSIS — Z961 Presence of intraocular lens: Secondary | ICD-10-CM | POA: Diagnosis not present

## 2021-07-06 DIAGNOSIS — H35373 Puckering of macula, bilateral: Secondary | ICD-10-CM | POA: Diagnosis not present

## 2021-07-06 DIAGNOSIS — H532 Diplopia: Secondary | ICD-10-CM | POA: Diagnosis not present

## 2021-07-06 NOTE — Telephone Encounter (Signed)
Left message advising that MMM is out today but will return tomorrow and we will call as soon as she addresses this call.

## 2021-07-07 ENCOUNTER — Other Ambulatory Visit: Payer: Self-pay | Admitting: Nurse Practitioner

## 2021-07-07 DIAGNOSIS — H532 Diplopia: Secondary | ICD-10-CM

## 2021-07-07 NOTE — Progress Notes (Unsigned)
Went ahead and ordered MRI and we will see what insurance says.

## 2021-07-11 ENCOUNTER — Ambulatory Visit (INDEPENDENT_AMBULATORY_CARE_PROVIDER_SITE_OTHER): Payer: Medicare Other

## 2021-07-11 DIAGNOSIS — I639 Cerebral infarction, unspecified: Secondary | ICD-10-CM | POA: Diagnosis not present

## 2021-07-19 NOTE — Progress Notes (Signed)
Carelink Summary Report / Loop Recorder 

## 2021-07-25 ENCOUNTER — Other Ambulatory Visit: Payer: Self-pay

## 2021-07-25 ENCOUNTER — Ambulatory Visit (HOSPITAL_COMMUNITY)
Admission: RE | Admit: 2021-07-25 | Discharge: 2021-07-25 | Disposition: A | Discharge status: Home / Self Care | Payer: Medicare Other | Source: Ambulatory Visit | Attending: Nurse Practitioner | Admitting: Nurse Practitioner

## 2021-07-25 DIAGNOSIS — H532 Diplopia: Secondary | ICD-10-CM | POA: Insufficient documentation

## 2021-07-25 DIAGNOSIS — G319 Degenerative disease of nervous system, unspecified: Secondary | ICD-10-CM | POA: Diagnosis not present

## 2021-07-25 DIAGNOSIS — G9389 Other specified disorders of brain: Secondary | ICD-10-CM | POA: Diagnosis not present

## 2021-07-25 MED ORDER — GADOBUTROL 1 MMOL/ML IV SOLN
7.0000 mL | Freq: Once | INTRAVENOUS | Status: AC | PRN
  Administered 2021-07-25: 12:00:00 7 mL via INTRAVENOUS

## 2021-07-26 ENCOUNTER — Other Ambulatory Visit: Payer: Self-pay | Admitting: Nurse Practitioner

## 2021-07-26 DIAGNOSIS — I10 Essential (primary) hypertension: Secondary | ICD-10-CM

## 2021-07-26 DIAGNOSIS — F411 Generalized anxiety disorder: Secondary | ICD-10-CM

## 2021-08-08 ENCOUNTER — Encounter (INDEPENDENT_AMBULATORY_CARE_PROVIDER_SITE_OTHER): Payer: Medicare Other | Admitting: Ophthalmology

## 2021-08-11 ENCOUNTER — Telehealth: Payer: Self-pay | Admitting: Nurse Practitioner

## 2021-08-11 DIAGNOSIS — F321 Major depressive disorder, single episode, moderate: Secondary | ICD-10-CM

## 2021-08-11 MED ORDER — ESCITALOPRAM OXALATE 20 MG PO TABS: 20.0000 mg | ORAL_TABLET | Freq: Every day | ORAL | 5 refills | Status: DC

## 2021-08-11 NOTE — Telephone Encounter (Signed)
Patient seen for this 06/29/21, please advise.

## 2021-08-11 NOTE — Telephone Encounter (Signed)
Pt asking for an increase on escitalopram (LEXAPRO) 10 MG tablet to 20 MG. Use the [facility] Store. Please call back

## 2021-08-11 NOTE — Telephone Encounter (Signed)
Patient aware.

## 2021-08-11 NOTE — Telephone Encounter (Signed)
Increased lexapro and sent prescription to pharmacy

## 2021-08-12 ENCOUNTER — Ambulatory Visit: Payer: Self-pay | Admitting: Nurse Practitioner

## 2021-08-15 ENCOUNTER — Ambulatory Visit (INDEPENDENT_AMBULATORY_CARE_PROVIDER_SITE_OTHER): Payer: Medicare Other

## 2021-08-15 DIAGNOSIS — I639 Cerebral infarction, unspecified: Secondary | ICD-10-CM

## 2021-08-16 LAB — CUP PACEART REMOTE DEVICE CHECK
Date Time Interrogation Session: 20221211133037
Implantable Pulse Generator Implant Date: 20220322

## 2021-08-24 NOTE — Progress Notes (Signed)
Carelink Summary Report / Loop Recorder 

## 2021-08-29 ENCOUNTER — Other Ambulatory Visit: Payer: Self-pay | Admitting: Nurse Practitioner

## 2021-08-29 DIAGNOSIS — K219 Gastro-esophageal reflux disease without esophagitis: Secondary | ICD-10-CM

## 2021-08-29 DIAGNOSIS — I10 Essential (primary) hypertension: Secondary | ICD-10-CM

## 2021-08-30 ENCOUNTER — Telehealth: Payer: Self-pay | Admitting: Adult Health

## 2021-08-30 NOTE — Telephone Encounter (Signed)
It is ok for pt to reschedule. Please schedule pt

## 2021-08-30 NOTE — Telephone Encounter (Signed)
Pt would like a call from the nurse to discuss if need to come on 09/01/21, if not would like to reschedule. Daughter having some issues.  Encouraged pt to reschedule, but she insisted on sending a message.

## 2021-09-01 ENCOUNTER — Ambulatory Visit: Payer: Medicare Other | Admitting: Adult Health

## 2021-09-19 ENCOUNTER — Ambulatory Visit (INDEPENDENT_AMBULATORY_CARE_PROVIDER_SITE_OTHER): Payer: Medicare Other

## 2021-09-19 ENCOUNTER — Other Ambulatory Visit: Payer: Self-pay | Admitting: Nurse Practitioner

## 2021-09-19 DIAGNOSIS — I639 Cerebral infarction, unspecified: Secondary | ICD-10-CM

## 2021-09-19 DIAGNOSIS — F411 Generalized anxiety disorder: Secondary | ICD-10-CM

## 2021-09-19 LAB — CUP PACEART REMOTE DEVICE CHECK
Date Time Interrogation Session: 20230122231151
Implantable Pulse Generator Implant Date: 20220322

## 2021-09-21 ENCOUNTER — Ambulatory Visit: Payer: Medicare Other | Admitting: Nurse Practitioner

## 2021-09-27 DIAGNOSIS — H518 Other specified disorders of binocular movement: Secondary | ICD-10-CM | POA: Diagnosis not present

## 2021-09-27 DIAGNOSIS — H532 Diplopia: Secondary | ICD-10-CM | POA: Diagnosis not present

## 2021-09-27 DIAGNOSIS — I6930 Unspecified sequelae of cerebral infarction: Secondary | ICD-10-CM | POA: Diagnosis not present

## 2021-09-30 NOTE — Progress Notes (Signed)
Carelink Summary Report / Loop Recorder 

## 2021-10-01 NOTE — Progress Notes (Signed)
Cardiology Office Note:    Date:  10/03/2021   ID:  Christina Silva, DOB May 10, 1933, MRN [id number]  PCP:  [name], [facility] Providers Cardiologist:  Marciano Mundt Martin[place], MD     Referring MD: [name], *   Chief Complaint  Patient presents with   Stroke Symptoms    History of Present Illness:    Christina Silva is a Christina Silva with a hx of HTN, HLD, h/o CVA, GERD, anxiety and vertigo.  Patient was seen by Dr. Martinique in May 2019 for blood pressure management and lower extremity edema.  Echocardiogram at the time showed EF 55 to 60%, grade 1 DD, mild AI, mild to moderate MR/TR.  He February 2020, she suffered cryptogenic stroke and was placed on aspirin and Plavix for 1 month then aspirin alone after that.  Repeat echocardiogram in February 2020 showed EF 60 to 65%, grade 1 DD, basal septal hypertrophy, mild AI, moderate aortic annular calcifications, mild mitral annular calcification.  She had a TIA in July 2020 and was restarted on Plavix.  Plavix was later discontinued again by Dr. Leonie Man of neurology.  She was seen in August 2020 with atypical chest pain and negative troponin.  She was prescribed pantoprazole.  Follow-up Myoview was normal.  She was started on amlodipine for hypertension.  MRI of the brain obtained in January 2022 showed a new right posterior cerebral artery occlusion close to its origin and a stable appearance of moderate P1/P2 stenosis.  An implantable loop recorder was placed.  No arrhythmia noted to date.   She does note symptoms of diplopia. Had MRI in November without change. Followed by ophthalmology at [facility].   Past Medical History:  Diagnosis Date   Anxiety    Cataract    GERD (gastroesophageal reflux disease)    Hyperlipidemia    Hypertension    SCCA (squamous cell carcinoma) of skin 11/02/2020   Mid Chest (in situ) (tx p bx)   SCCA (squamous cell carcinoma) of skin 11/02/2020   Right Forearm Posterior (in situ) (tx p  bx)   SCCA (squamous cell carcinoma) of skin 11/02/2020   Left Submandibular Area (in situ) (tx p bx)   Stroke ([place]) 09/2018   Vertigo    patient denies    Past Surgical History:  Procedure Laterality Date   ABDOMINAL HYSTERECTOMY  1980   CATARACT EXTRACTION Right    CHOLECYSTECTOMY N/A 12/16/2019   Procedure: LAPAROSCOPIC CHOLECYSTECTOMY WITH INTRAOPERATIVE CHOLANGIOGRAM;  Surgeon: Erroll Luna, MD;  Location: [facility] OR;  Service: General;  Laterality: N/A;   EYE SURGERY     implantable loop recorder placement  11/12/2020    Medtronic Reveal Linq model [id number] implantable loop recorder (SN [id number]) implanted by Dr Rayann Heman for cryptogenic stroke   KNEE ARTHROSCOPY Left    ROTATOR CUFF REPAIR Left    SQUAMOUS CELL CARCINOMA EXCISION Left    TONSILLECTOMY      Current Medications: Current Meds  Medication Sig   amLODipine (NORVASC) 2.5 MG tablet TAKE 1 TABLET BY MOUTH EVERY EVENING   aspirin EC 81 MG tablet Take 81 mg by mouth daily. Swallow whole.   cholecalciferol (VITAMIN D) 25 MCG (1000 UNIT) tablet Take 2,000 Units by mouth daily.   clonazePAM (KLONOPIN) 0.5 MG tablet Take 1 tablet (0.5 mg total) by mouth 2 (two) times daily as needed for anxiety.   Coenzyme Q10 (CO Q 10 PO) Take by mouth.   escitalopram (LEXAPRO) 20 MG tablet Take 1  tablet (20 mg total) by mouth daily.   losartan (COZAAR) 100 MG tablet TAKE ONE (1) TABLET EACH DAY   pantoprazole (PROTONIX) 40 MG tablet TAKE ONE (1) TABLET BY MOUTH EVERY DAY   rosuvastatin (CRESTOR) 20 MG tablet Take 1 tablet (20 mg total) by mouth daily.   topiramate (TOPAMAX) 50 MG tablet Take 1 tablet (50 mg total) by mouth 2 (two) times daily.   Ubiquinone (ULTRA COQ10 PO) Take 100 mg by mouth in the morning and at bedtime.   vitamin C (ASCORBIC ACID) 500 MG tablet Take 500 mg by mouth daily.     Allergies:   Codeine, Lisinopril, Morphine, Other, Sulfa antibiotics, and Sulfonamide derivatives   Social History   Socioeconomic History    Marital status: Widowed    Spouse name: Not on file   Number of children: 2   Years of education: 10   Highest education level: Not on file  Occupational History   Occupation: retired  Tobacco Use   Smoking status: Former    Types: Cigarettes    Quit date: 01/09/1983    Years since quitting: 38.7   Smokeless tobacco: Never  Vaping Use   Vaping Use: Never used  Substance and Sexual Activity   Alcohol use: No   Drug use: No   Sexual activity: Yes  Other Topics Concern   Not on file  Social History Narrative   Lives alone handicap accessible home - daughter in [place] and son in [place].   Right Handed   Drinks caffeine occassionally   Social Determinants of Health   Financial Resource Strain: Low Risk    Difficulty of Paying Living Expenses: Not hard at all  Food Insecurity: No Food Insecurity   Worried About Charity fundraiser in the Last Year: Never true   Ran Out of Food in the Last Year: Never true  Transportation Needs: No Transportation Needs   Lack of Transportation (Medical): No   Lack of Transportation (Non-Medical): No  Physical Activity: Insufficiently Active   Days of Exercise per Week: 6 days   Minutes of Exercise per Session: 20 min  Stress: Stress Concern Present   Feeling of Stress : Rather much  Social Connections: Moderately Integrated   Frequency of Communication with Friends and Family: More than three times a week   Frequency of Social Gatherings with Friends and Family: More than three times a week   Attends Religious Services: 1 to 4 times per year   Active Member of [employer] or Organizations: Yes   Attends Archivist Meetings: 1 to 4 times per year   Marital Status: Widowed     Family History: The patient's family history includes Arthritis in her brother; Epilepsy in her brother; Heart attack in her mother; Heart disease in her mother and sister; Hyperlipidemia in her mother; Psychiatric Illness in her sister; Stroke in her  father; Transient ischemic attack in her father. There is no history of Colon cancer, Esophageal cancer, Rectal cancer, or Stomach cancer.  ROS:   Please see the history of present illness.     All other systems reviewed and are negative.  EKGs/Labs/Other Studies Reviewed:    The following studies were reviewed today:  Echo 10/18/2018 1. The left ventricle has normal systolic function with an ejection  fraction of 60-65%. The cavity size was normal. There is basal septal  hypertophy. Left ventricular diastolic Doppler parameters are consistent  with impaired relaxation.   2. The right ventricle has normal systolic  function. The cavity was  normal. There is no increase in right ventricular wall thickness.   3. The aortic valve is tricuspid Moderate thickening of the aortic valve  Moderate calcification of the aortic valve. Aortic valve regurgitation is  mild by color flow Doppler.. Moderate aortic annular calcification noted.   4. The mitral valve is normal in structure. Mild thickening of the mitral  valve leaflet. Mild calcification of the mitral valve leaflet. There is  mild mitral annular calcification present. No evidence of mitral valve  stenosis.   5. The tricuspid valve is normal in structure.   6. The pulmonic valve was grossly normal. Pulmonic valve regurgitation is  mild by color flow Doppler.   7. The aortic root is normal in size and structure.   8. Pulmonary hypertension is indeterminat,inadequate TR jet.   EKG:  EKG is not ordered today.    Recent Labs: 02/09/2021: BUN 6; Creatinine, Ser 0.68; Hemoglobin 15.1; Platelets 297; Potassium 4.2; Sodium 146 06/14/2021: ALT 10  Recent Lipid Panel    Component Value Date/Time   CHOL 154 06/14/2021 0944   TRIG 98 06/14/2021 0944   TRIG 149 02/26/2014 0930   HDL 59 06/14/2021 0944   HDL 68 02/26/2014 0930   CHOLHDL 2.6 06/14/2021 0944   CHOLHDL 3.6 10/18/2018 0625   VLDL 27 10/18/2018 0625   LDLCALC 77 06/14/2021 0944    LDLCALC 144 (H) 02/26/2014 0930     Risk Assessment/Calculations:           Physical Exam:    VS:  BP (!) 147/71    Pulse 67    Ht 5\' 4"  (1.626 m)    Wt 119 lb 3.2 oz (54.1 kg)    SpO2 94%    BMI 20.46 kg/m     Wt Readings from Last 3 Encounters:  10/03/21 119 lb 3.2 oz (54.1 kg)  06/29/21 127 lb (57.6 kg)  06/28/21 126 lb (57.2 kg)     GEN:  Well nourished, well developed in no acute distress HEENT: Normal NECK: No JVD; No carotid bruits LYMPHATICS: No lymphadenopathy CARDIAC: RRR, no murmurs, rubs, gallops RESPIRATORY:  Clear to auscultation without rales, wheezing or rhonchi  ABDOMEN: Soft, non-tender, non-distended MUSCULOSKELETAL:  No edema; No deformity  SKIN: Warm and dry NEUROLOGIC:  Alert and oriented x 3 PSYCHIATRIC:  Normal affect   ASSESSMENT:    1. Cryptogenic stroke ([place])   2. Hyperlipidemia with target LDL less than 100   3. Primary hypertension     PLAN:    In order of problems listed above:   Hyperlipidemia: LDL remains elevated, increase Crestor to 20 mg daily.  LDL improved to 77. continue  Hypertension: Blood pressure stable  History of crytogenic stroke: No recurrence. No arrhythmia on ILR      Follow up in one year.  Medication Adjustments/Labs and Tests Ordered: Current medicines are reviewed at length with the patient today.  Concerns regarding medicines are outlined above.  No orders of the defined types were placed in this encounter.  No orders of the defined types were placed in this encounter.   There are no Patient Instructions on file for this visit.   Signed, Alona Danford [place], MD  10/03/2021 1:37 PM    [facility]

## 2021-10-03 ENCOUNTER — Other Ambulatory Visit: Payer: Self-pay

## 2021-10-03 ENCOUNTER — Ambulatory Visit: Payer: Medicare Other | Admitting: Cardiology

## 2021-10-03 ENCOUNTER — Encounter: Payer: Self-pay | Admitting: Cardiology

## 2021-10-03 VITALS — BP 147/71 | HR 67 | Ht 64.0 in | Wt 119.2 lb

## 2021-10-03 DIAGNOSIS — I639 Cerebral infarction, unspecified: Secondary | ICD-10-CM

## 2021-10-03 DIAGNOSIS — I10 Essential (primary) hypertension: Secondary | ICD-10-CM

## 2021-10-03 DIAGNOSIS — E785 Hyperlipidemia, unspecified: Secondary | ICD-10-CM

## 2021-10-04 ENCOUNTER — Ambulatory Visit (INDEPENDENT_AMBULATORY_CARE_PROVIDER_SITE_OTHER): Payer: Medicare Other | Admitting: Nurse Practitioner

## 2021-10-04 ENCOUNTER — Encounter: Payer: Self-pay | Admitting: Nurse Practitioner

## 2021-10-04 VITALS — BP 136/68 | HR 71 | Temp 97.1°F | Ht 64.0 in | Wt 118.6 lb

## 2021-10-04 DIAGNOSIS — N3000 Acute cystitis without hematuria: Secondary | ICD-10-CM

## 2021-10-04 DIAGNOSIS — I10 Essential (primary) hypertension: Secondary | ICD-10-CM

## 2021-10-04 DIAGNOSIS — R634 Abnormal weight loss: Secondary | ICD-10-CM

## 2021-10-04 DIAGNOSIS — R739 Hyperglycemia, unspecified: Secondary | ICD-10-CM

## 2021-10-04 DIAGNOSIS — I6930 Unspecified sequelae of cerebral infarction: Secondary | ICD-10-CM | POA: Diagnosis not present

## 2021-10-04 DIAGNOSIS — G43009 Migraine without aura, not intractable, without status migrainosus: Secondary | ICD-10-CM | POA: Diagnosis not present

## 2021-10-04 DIAGNOSIS — M8588 Other specified disorders of bone density and structure, other site: Secondary | ICD-10-CM | POA: Diagnosis not present

## 2021-10-04 DIAGNOSIS — K219 Gastro-esophageal reflux disease without esophagitis: Secondary | ICD-10-CM | POA: Diagnosis not present

## 2021-10-04 DIAGNOSIS — F411 Generalized anxiety disorder: Secondary | ICD-10-CM

## 2021-10-04 DIAGNOSIS — E785 Hyperlipidemia, unspecified: Secondary | ICD-10-CM | POA: Diagnosis not present

## 2021-10-04 DIAGNOSIS — F321 Major depressive disorder, single episode, moderate: Secondary | ICD-10-CM

## 2021-10-04 LAB — URINALYSIS, COMPLETE
Bilirubin, UA: NEGATIVE
Glucose, UA: NEGATIVE
Nitrite, UA: NEGATIVE
Specific Gravity, UA: 1.025 (ref 1.005–1.030)
Urobilinogen, Ur: 0.2 mg/dL (ref 0.2–1.0)
pH, UA: 6 (ref 5.0–7.5)

## 2021-10-04 LAB — MICROSCOPIC EXAMINATION: Renal Epithel, UA: NONE SEEN /hpf

## 2021-10-04 MED ORDER — AMLODIPINE BESYLATE 2.5 MG PO TABS: 2.5000 mg | ORAL_TABLET | Freq: Every evening | ORAL | 1 refills | Status: DC

## 2021-10-04 MED ORDER — CEPHALEXIN 500 MG PO CAPS: 500.0000 mg | ORAL_CAPSULE | Freq: Two times a day (BID) | ORAL | 0 refills | Status: DC

## 2021-10-04 MED ORDER — ROSUVASTATIN CALCIUM 20 MG PO TABS: 20.0000 mg | ORAL_TABLET | Freq: Every day | ORAL | 1 refills | Status: DC

## 2021-10-04 MED ORDER — ESCITALOPRAM OXALATE 20 MG PO TABS: 20.0000 mg | ORAL_TABLET | Freq: Every day | ORAL | 1 refills | Status: DC

## 2021-10-04 MED ORDER — CLONAZEPAM 0.5 MG PO TABS: 0.5000 mg | ORAL_TABLET | Freq: Two times a day (BID) | ORAL | 1 refills | Status: DC | PRN

## 2021-10-04 MED ORDER — MEGESTROL ACETATE 20 MG PO TABS: 20.0000 mg | ORAL_TABLET | Freq: Every day | ORAL | 1 refills | Status: DC

## 2021-10-04 MED ORDER — PANTOPRAZOLE SODIUM 40 MG PO TBEC: 40.0000 mg | DELAYED_RELEASE_TABLET | Freq: Every day | ORAL | 1 refills | Status: DC

## 2021-10-04 MED ORDER — BUPROPION HCL ER (XL) 150 MG PO TB24
150.0000 mg | ORAL_TABLET | Freq: Every day | ORAL | 1 refills | Status: DC
Start: 2021-10-04 — End: 2021-11-25

## 2021-10-04 MED ORDER — LOSARTAN POTASSIUM 100 MG PO TABS: 100.0000 mg | ORAL_TABLET | Freq: Every day | ORAL | 1 refills | Status: DC

## 2021-10-04 NOTE — Addendum Note (Signed)
Addended by: [name] on: 10/04/2021 12:46 PM   Modules accepted: Orders

## 2021-10-04 NOTE — Patient Instructions (Signed)

## 2021-10-04 NOTE — Addendum Note (Signed)
Addended by: [name] on: 10/04/2021 02:31 PM   Modules accepted: Orders

## 2021-10-04 NOTE — Progress Notes (Addendum)
Subjective:    Patient ID: Christina Silva, female    DOB: Sep 10, 1932, 88 y.o.   MRN: [id number]   Chief Complaint: medical management of chronic issues     HPI:  Christina Silva is a 88 y.o. who identifies as a female who was assigned female at birth.   Social history: Lives with: by herself- her daughter checks on her daily Work history: retired   Scientist, forensic in today for follow up of the following chronic medical issues:  1. Primary hypertension No c/o chest pain, sob or headache. Doe snot check blood pressure at home much. BP Readings from Last 3 Encounters:  10/04/21 136/68  10/03/21 (!) 147/71  06/29/21 119/68      2. Migraine without aura and without status migrainosus, not intractable NO HEADACHE/MIGRAINE IN SEVERAL MONTHS  3. Gastroesophageal reflux disease without esophagitis Is on protonix daily and is doing well.  4. Hyperlipidemia with target LDL less than 100 Does try to watch diet and stay as active as she can. Lab Results  Component Value Date   CHOL 154 06/14/2021   HDL 59 06/14/2021   LDLCALC 77 06/14/2021   TRIG 98 06/14/2021   CHOLHDL 2.6 06/14/2021     5. Hyperglycemia She does not check her blood sugars at home. Lab Results  Component Value Date   HGBA1C 5.6 02/09/2021     6. Anxiety state She stays anxious and that has gotten worse since her husband passed away. Is on klonopin as needed. She does not take it everyday. She is also on lexapro daily. GAD 7 : Generalized Anxiety Score 06/29/2021 06/14/2021 02/09/2021 03/23/2020  Nervous, Anxious, on Edge _0 Control/stop worrying _1 0  Worry too much - different things _2 0  Trouble relaxing 3 1 0 0  Restless 2 0 0 0  Easily annoyed or irritable 1 0 0 0  Afraid - awful might happen 3 1 0 0  Total GAD 7 Score _3 Anxiety Difficulty Not difficult at all Not difficult at all Not difficult at all Not difficult at all   Depression screen [facility] 2/9 06/29/2021 06/28/2021  06/14/2021  Decreased Interest _4 Down, Depressed, Hopeless 3 0 0  PHQ - 2 Score _5 Altered sleeping 2 1 0  Tired, decreased energy _6 Change in appetite _7 Feeling bad or failure about yourself  0 0 0  Trouble concentrating 0 1 0  Moving slowly or fidgety/restless 0 0 1  Suicidal thoughts 0 0 0  PHQ-9 Score _8 Difficult doing work/chores Not difficult at all Somewhat difficult Not difficult at all  Some recent data might be hidden      7. Osteopenia of spine She has not had a dexascan since 2016 and declines to do them now.  8. Late effect of cerebrovascular accident (CVA) No permanent effects.  Weight is down 10 lbs- says that she has no appetite Wt Readings from Last 3 Encounters:  10/04/21 118 lb 9.6 oz (53.8 kg)  10/03/21 119 lb 3.2 oz (54.1 kg)  06/29/21 127 lb (57.6 kg)      New complaints: Has been having double vision for awhile now. Has been to neurology as well as eye doctor and they can find no reason for the double vision. She went to [facility] and the stuck something on her glasses  to see if  will help. Has made a world of difference.  Allergies  Allergen Reactions   Codeine Nausea And Vomiting   Lisinopril Swelling   Morphine Nausea And Vomiting   Other    Sulfa Antibiotics Nausea Only and Rash   Sulfonamide Derivatives Nausea Only and Rash   Outpatient Encounter Medications as of 10/04/2021  Medication Sig   amLODipine (NORVASC) 2.5 MG tablet TAKE 1 TABLET BY MOUTH EVERY EVENING   aspirin EC 81 MG tablet Take 81 mg by mouth daily. Swallow whole.   cholecalciferol (VITAMIN D) 25 MCG (1000 UNIT) tablet Take 2,000 Units by mouth daily.   clonazePAM (KLONOPIN) 0.5 MG tablet Take 1 tablet (0.5 mg total) by mouth 2 (two) times daily as needed for anxiety.   Coenzyme Q10 (CO Q 10 PO) Take by mouth.   escitalopram (LEXAPRO) 20 MG tablet Take 1 tablet (20 mg total) by mouth daily.   losartan (COZAAR) 100 MG tablet TAKE ONE (1) TABLET EACH DAY    pantoprazole (PROTONIX) 40 MG tablet TAKE ONE (1) TABLET BY MOUTH EVERY DAY   rosuvastatin (CRESTOR) 20 MG tablet Take 1 tablet (20 mg total) by mouth daily.   topiramate (TOPAMAX) 50 MG tablet Take 1 tablet (50 mg total) by mouth 2 (two) times daily.   Ubiquinone (ULTRA COQ10 PO) Take 100 mg by mouth in the morning and at bedtime.   vitamin C (ASCORBIC ACID) 500 MG tablet Take 500 mg by mouth daily.   No facility-administered encounter medications on file as of 10/04/2021.    Past Surgical History:  Procedure Laterality Date   ABDOMINAL HYSTERECTOMY  1980   CATARACT EXTRACTION Right    CHOLECYSTECTOMY N/A 12/16/2019   Procedure: LAPAROSCOPIC CHOLECYSTECTOMY WITH INTRAOPERATIVE CHOLANGIOGRAM;  Surgeon: Erroll Luna, MD;  Location: [place];  Service: General;  Laterality: N/A;   EYE SURGERY     implantable loop recorder placement  11/12/2020    Medtronic Reveal Linq model [id number] implantable loop recorder (SN [id number]) implanted by Dr Rayann Heman for cryptogenic stroke   KNEE ARTHROSCOPY Left    ROTATOR CUFF REPAIR Left    SQUAMOUS CELL CARCINOMA EXCISION Left    TONSILLECTOMY      Family History  Problem Relation Age of Onset   Heart attack Mother    Hyperlipidemia Mother    Heart disease Mother    Transient ischemic attack Father    Stroke Father    Psychiatric Illness Sister    Heart disease Sister    Arthritis Brother    Epilepsy Brother    Colon cancer Neg Hx    Esophageal cancer Neg Hx    Rectal cancer Neg Hx    Stomach cancer Neg Hx       Controlled substance contract: 02/10/21     Review of Systems  Constitutional:  Negative for diaphoresis.  Eyes:  Negative for pain.  Respiratory:  Negative for shortness of breath.   Cardiovascular:  Negative for chest pain, palpitations and leg swelling.  Gastrointestinal:  Negative for abdominal pain.  Endocrine: Negative for polydipsia.  Skin:  Negative for rash.  Neurological:  Negative for dizziness, weakness and  headaches.  Hematological:  Does not bruise/bleed easily.  All other systems reviewed and are negative.     Objective:   Physical Exam Vitals and nursing note reviewed.  Constitutional:      General: She is not in acute distress.    Appearance: Normal appearance. She is well-developed.  HENT:     Head: Normocephalic.  Right Ear: Tympanic membrane normal.     Left Ear: Tympanic membrane normal.     Nose: Nose normal.     Mouth/Throat:     Mouth: Mucous membranes are moist.  Eyes:     Pupils: Pupils are equal, round, and reactive to light.  Neck:     Vascular: No carotid bruit or JVD.  Cardiovascular:     Rate and Rhythm: Normal rate and regular rhythm.     Heart sounds: Normal heart sounds.  Pulmonary:     Effort: Pulmonary effort is normal. No respiratory distress.     Breath sounds: Normal breath sounds. No wheezing or rales.  Chest:     Chest wall: No tenderness.  Abdominal:     General: Bowel sounds are normal. There is no distension or abdominal bruit.     Palpations: Abdomen is soft. There is no hepatomegaly, splenomegaly, mass or pulsatile mass.     Tenderness: There is no abdominal tenderness.  Musculoskeletal:        General: Normal range of motion.     Cervical back: Normal range of motion and neck supple.  Lymphadenopathy:     Cervical: No cervical adenopathy.  Skin:    General: Skin is warm and dry.  Neurological:     Mental Status: She is alert and oriented to person, place, and time.     Deep Tendon Reflexes: Reflexes are normal and symmetric.  Psychiatric:        Behavior: Behavior normal.        Thought Content: Thought content normal.        Judgment: Judgment normal.    BP 136/68    Pulse 71    Temp (!) 97.1 F (36.2 C)    Ht _0  (1.626 m)    Wt 118 lb 9.6 oz (53.8 kg)    SpO2 98%    BMI 20.36 kg/m        Assessment & Plan:  TIRA LAFFERTY comes in today with chief complaint of Medical Management of Chronic Issues   Diagnosis and  orders addressed:  1. Primary hypertension Low sodium diet - CBC with Differential/Platelet - CMP14+EGFR - losartan (COZAAR) 100 MG tablet; Take 1 tablet (100 mg total) by mouth daily.  Dispense: 90 tablet; Refill: 1 - amLODipine (NORVASC) 2.5 MG tablet; Take 1 tablet (2.5 mg total) by mouth every evening.  Dispense: 90 tablet; Refill: 1  2. Migraine without aura and without status migrainosus, not intractable Avoid caffeine  3. Gastroesophageal reflux disease without esophagitis Avoid spicy foods Do not eat 2 hours prior to bedtime - pantoprazole (PROTONIX) 40 MG tablet; Take 1 tablet (40 mg total) by mouth daily.  Dispense: 90 tablet; Refill: 1  4. Hyperlipidemia with target LDL less than 100 Low fat diet - Lipid panel - rosuvastatin (CRESTOR) 20 MG tablet; Take 1 tablet (20 mg total) by mouth daily.  Dispense: 90 tablet; Refill: 1  5. Hyperglycemia Watch carb sin diet  6. Osteopenia of spine Weight bearing exercise when can tolerate  7. Late effect of cerebrovascular accident (CVA) - Vitamin B12  8. Depression, major, single episode, moderate (HCC) Stress managemnt - buPROPion (WELLBUTRIN XL) 150 MG 24 hr tablet; Take 1 tablet (150 mg total) by mouth daily.  Dispense: 90 tablet; Refill: 1 - escitalopram (LEXAPRO) 20 MG tablet; Take 1 tablet (20 mg total) by mouth daily.  Dispense: 90 tablet; Refill: 1  9. GAD (generalized anxiety disorder) Stress management - clonazePAM (KLONOPIN) 0.5  MG tablet; Take 1 tablet (0.5 mg total) by mouth 2 (two) times daily as needed for anxiety.  Dispense: 60 tablet; Refill: 1  11. Weight loss Daily boost - megestrol (MEGACE) 20 MG tablet; Take 1 tablet (20 mg total) by mouth daily.  Dispense: 90 tablet; Refill: 1   Labs pending Health Maintenance reviewed Diet and exercise encouraged  Follow up plan: 6 months   Mary-Margaret Hassell Done, FNP   Urine came back showing acute cystitis- keflex sent to pharmacy- cullture ordered- patient  informed. Mary-Margaret Hassell Done, FNP

## 2021-10-05 LAB — LIPID PANEL

## 2021-10-06 LAB — CMP14+EGFR
ALT: 14 IU/L (ref 0–32)
AST: 20 IU/L (ref 0–40)
Albumin/Globulin Ratio: 2.4 — ABNORMAL HIGH (ref 1.2–2.2)
Albumin: 4.5 g/dL (ref 3.6–4.6)
Alkaline Phosphatase: 85 IU/L (ref 44–121)
BUN/Creatinine Ratio: 14 (ref 12–28)
BUN: 10 mg/dL (ref 8–27)
Bilirubin Total: 0.5 mg/dL (ref 0.0–1.2)
CO2: 21 mmol/L (ref 20–29)
Calcium: 9.7 mg/dL (ref 8.7–10.3)
Chloride: 107 mmol/L — ABNORMAL HIGH (ref 96–106)
Creatinine, Ser: 0.7 mg/dL (ref 0.57–1.00)
Globulin, Total: 1.9 g/dL (ref 1.5–4.5)
Glucose: 99 mg/dL (ref 70–99)
Potassium: 3.9 mmol/L (ref 3.5–5.2)
Sodium: 145 mmol/L — ABNORMAL HIGH (ref 134–144)
Total Protein: 6.4 g/dL (ref 6.0–8.5)
eGFR: 84 mL/min/{1.73_m2} (ref >59)

## 2021-10-06 LAB — CBC WITH DIFFERENTIAL/PLATELET
Basophils Absolute: 0 10*3/uL (ref 0.0–0.2)
Basos: 1 %
EOS (ABSOLUTE): 0.1 10*3/uL (ref 0.0–0.4)
Eos: 2 %
Hematocrit: 43.8 % (ref 34.0–46.6)
Hemoglobin: 14.4 g/dL (ref 11.1–15.9)
Immature Grans (Abs): 0 10*3/uL (ref 0.0–0.1)
Immature Granulocytes: 0 %
Lymphocytes Absolute: 1.9 10*3/uL (ref 0.7–3.1)
Lymphs: 35 %
MCH: 30.4 pg (ref 26.6–33.0)
MCHC: 32.9 g/dL (ref 31.5–35.7)
MCV: 93 fL (ref 79–97)
Monocytes Absolute: 0.5 10*3/uL (ref 0.1–0.9)
Monocytes: 9 %
Neutrophils Absolute: 2.9 10*3/uL (ref 1.4–7.0)
Neutrophils: 53 %
Platelets: 277 10*3/uL (ref 150–450)
RBC: 4.73 x10E6/uL (ref 3.77–5.28)
RDW: 11.9 % (ref 11.7–15.4)
WBC: 5.4 10*3/uL (ref 3.4–10.8)

## 2021-10-06 LAB — VITAMIN B12: Vitamin B-12: 614 pg/mL (ref 232–1245)

## 2021-10-06 LAB — LIPID PANEL
Chol/HDL Ratio: 2.9 ratio (ref 0.0–4.4)
Cholesterol, Total: 140 mg/dL (ref 100–199)
HDL: 48 mg/dL (ref >39)
LDL Chol Calc (NIH): 72 mg/dL (ref 0–99)
Triglycerides: 112 mg/dL (ref 0–149)
VLDL Cholesterol Cal: 20 mg/dL (ref 5–40)

## 2021-10-07 LAB — URINE CULTURE

## 2021-10-13 ENCOUNTER — Telehealth: Payer: Self-pay | Admitting: Nurse Practitioner

## 2021-10-13 ENCOUNTER — Other Ambulatory Visit: Payer: Self-pay | Admitting: Adult Health

## 2021-10-13 DIAGNOSIS — G43009 Migraine without aura, not intractable, without status migrainosus: Secondary | ICD-10-CM

## 2021-10-13 NOTE — Telephone Encounter (Signed)
NA

## 2021-10-13 NOTE — Telephone Encounter (Signed)
Pt called requesting to speak with MMM.  Says she woke up dizzy this morning and wants to know if it could be from her taking Wellbutrin and Lexapro?  Please advise and call patient.

## 2021-10-23 LAB — CUP PACEART REMOTE DEVICE CHECK
Date Time Interrogation Session: 20230224230814
Implantable Pulse Generator Implant Date: 20220322

## 2021-10-24 ENCOUNTER — Ambulatory Visit (INDEPENDENT_AMBULATORY_CARE_PROVIDER_SITE_OTHER): Payer: Medicare Other

## 2021-10-24 DIAGNOSIS — I639 Cerebral infarction, unspecified: Secondary | ICD-10-CM

## 2021-10-25 ENCOUNTER — Ambulatory Visit: Payer: Medicare Other | Admitting: Adult Health

## 2021-10-25 NOTE — Progress Notes (Unsigned)
[facility] [address] [place]. [place] [address] [phone]       OFFICE FOLLOW UP VISIT NOTE  Ms. LEIANNE CALLINS Date of Birth:  01/15/1933 Medical Record Number:  [id number]   Primary neurologist: Dr. Leonie Man Referring MD: [name], Normal  Reason for Referral: Stroke 2nd opinion  No chief complaint on file.     HPI:   Initial visit 10/19/2020 Dr. Leonie Man: Ms. Chronister is a 88 year old pleasant Caucasian lady seen today for initial office consultation visit. She is accompanied by her daughter. History is obtained from them and review of electronic medical records and up reviewed personally pertinent imaging films in PACS. She has past medical history of anxiety, hypertension, hyperlipidemia and gastroesophageal reflux disease. She was admitted to [facility] on 10/18/2018 with sudden onset of left upper extremity weakness and numbness. MRI scan at that time it showed a large 2 cm right basal ganglia infarct and MRI of the brain had shown moderate left P2 stenosis. Echocardiogram was unremarkable. She was started on aspirin and Plavix and will saw Dr. Merlene Laughter. She states she did well from the stroke and made good recovery. 6 months ago she had several episodes of transient left hemifield vision loss along with the dull headache which lasted 15 minutes and she also saw flashing lights. This occurred once daily for 3 days. 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 However MRA of the brain done on 09/01/2020 showed a new right posterior cerebral artery occlusion close to its origin and stable appearance of the moderate left P1/P2 stenosis. Prior carotid ultrasound on 04/22/2020 had shown no significant extracranial stenosis. Patient is not sure when her last lipid profile or A1c was checked. She is on aspirin and Plavix and tolerating it well without major  bleeding but does have frequent bruising. On inquiry she admits to a few episodes of transient horizontal diplopia that she has had in the last few months. This has occurred mostly when she is driving she can see 2 lines instead of one and on one occasion she had to pull off the road and sit in [place] parking lot till this improved. She has been scared to drive since then. She has seen Dr. Merlene Laughter in the past and he had suggested doing a 30-day heart monitor but for unclear reason that this never happened and patient was unhappy and is here to see me for a 2nd opinion. Update 02/08/2021 Dr. Leonie Man; she returns for follow-up after last visit on 10/19/2020.  She is accompanied by her daughter [name].  Patient states she has had 3 episodes of brief visual disturbance followed by mild headache on May 9, May 10 and June 11.  They were all stereotypical and similar.  Whole episode lasted 10 to 15 minutes.  She just noticed some visual disturbance in the left eye which moved across to the right eye.  She could still see.  She had a mild dull headache later.  There were no clear triggers or precipitating factors that she could identify.  Patient had lab work at last visit on 10/19/2020 which showed ESR of 14 mm.  LDL cholesterol 111 mg percent and triglycerides 156.  A hemoglobin A1c was 5.8.  I recommended we increase the dose of Crestor to 10 mg but patient's primary physician increased to 20 mg which she did not tolerate and developed muscle aches and  pains and it was discontinued.  She has been since restarted on 5 mg of Crestor which she is so far tolerating well.  She was also started on Topamax by Dr. Gerilyn Pilgrim neurologist but she is taking it only as as needed.  She is tolerating aspirin well without any bruising or bleeding.  Blood pressure is usually well controlled today slightly elevated in office at 152/74.  She has no new complaints.  She had outpatient loop recorder placed by Dr. Johney Frame and so for paroxysmal  A. fib has not yet been found.  Update 05/19/2021 JM: Returns for 58-month follow-up after prior visit with Dr. Pearlean Brownie.  She is accompanied by her daughter.  Reports 1 episode last week of flashing lights in vision followed by typical headache as well as 4 episodes of double vision while in the car (2 while driving, 2 while riding) lasting 3-4 min followed by typical headache.  She reports experiencing double vision intermittently over the past year.  Routinely followed by ophthalmology.  Has remained on topiramate 25 mg twice daily tolerating well but does not believe this is providing much benefit.  Denies new neurological symptoms.  Remains on aspirin and Crestor 20 mg daily without side effects.  Blood pressure today elevated at 162/80. Routinely monitor at home and typically stable around 120s/60-70s. Loop recorder has not shown atrial fibrillation thus far.  No new concerns at this time.  Update 10/25/2021 JM: Returns for follow-up visit after prior visit 5 months ago.  Denies any recent migraine headaches and remains on topiramate 50 mg twice daily tolerating without side effects.  Continues to be followed by ophthalmology.  Stable from stroke standpoint without new stroke/TIA symptoms.  Compliant on aspirin and Crestor without side effects.  Blood pressure today ***.  Loop recorder has not shown atrial fibrillation thus far.        ROS:   14 system review of systems is positive for those listed in HPI and all other systems negative  PMH:  Past Medical History:  Diagnosis Date   Anxiety    Cataract    GERD (gastroesophageal reflux disease)    Hyperlipidemia    Hypertension    SCCA (squamous cell carcinoma) of skin 11/02/2020   Mid Chest (in situ) (tx p bx)   SCCA (squamous cell carcinoma) of skin 11/02/2020   Right Forearm Posterior (in situ) (tx p bx)   SCCA (squamous cell carcinoma) of skin 11/02/2020   Left Submandibular Area (in situ) (tx p bx)   Stroke (HCC) 09/2018   Vertigo     patient denies    Social History:  Social History   Socioeconomic History   Marital status: Widowed    Spouse name: Not on file   Number of children: 2   Years of education: 10   Highest education level: Not on file  Occupational History   Occupation: retired  Tobacco Use   Smoking status: Former    Types: Cigarettes    Quit date: 01/09/1983    Years since quitting: 38.8   Smokeless tobacco: Never  Vaping Use   Vaping Use: Never used  Substance and Sexual Activity   Alcohol use: No   Drug use: No   Sexual activity: Yes  Other Topics Concern   Not on file  Social History Narrative   Lives alone handicap accessible home - daughter in [place] and son in [place].   Right Handed   Drinks caffeine occassionally   Social Determinants of Health  Financial Resource Strain: Low Risk    Difficulty of Paying Living Expenses: Not hard at all  Food Insecurity: No Food Insecurity   Worried About Charity fundraiser in the Last Year: Never true   Ran Out of Food in the Last Year: Never true  Transportation Needs: No Transportation Needs   Lack of Transportation (Medical): No   Lack of Transportation (Non-Medical): No  Physical Activity: Insufficiently Active   Days of Exercise per Week: 6 days   Minutes of Exercise per Session: 20 min  Stress: Stress Concern Present   Feeling of Stress : Rather much  Social Connections: Moderately Integrated   Frequency of Communication with Friends and Family: More than three times a week   Frequency of Social Gatherings with Friends and Family: More than three times a week   Attends Religious Services: 1 to 4 times per year   Active Member of [employer] or Organizations: Yes   Attends Archivist Meetings: 1 to 4 times per year   Marital Status: Widowed  Human resources officer Violence: Not At Risk   Fear of Current or Ex-Partner: No   Emotionally Abused: No   Physically Abused: No   Sexually Abused: No    Medications:    Current Outpatient Medications on File Prior to Visit  Medication Sig Dispense Refill   amLODipine (NORVASC) 2.5 MG tablet Take 1 tablet (2.5 mg total) by mouth every evening. 90 tablet 1   aspirin EC 81 MG tablet Take 81 mg by mouth daily. Swallow whole.     buPROPion (WELLBUTRIN XL) 150 MG 24 hr tablet Take 1 tablet (150 mg total) by mouth daily. 90 tablet 1   cephALEXin (KEFLEX) 500 MG capsule Take 1 capsule (500 mg total) by mouth 2 (two) times daily. 14 capsule 0   cholecalciferol (VITAMIN D) 25 MCG (1000 UNIT) tablet Take 2,000 Units by mouth daily.     clonazePAM (KLONOPIN) 0.5 MG tablet Take 1 tablet (0.5 mg total) by mouth 2 (two) times daily as needed for anxiety. 60 tablet 1   Coenzyme Q10 (CO Q 10 PO) Take by mouth.     escitalopram (LEXAPRO) 20 MG tablet Take 1 tablet (20 mg total) by mouth daily. 90 tablet 1   losartan (COZAAR) 100 MG tablet Take 1 tablet (100 mg total) by mouth daily. 90 tablet 1   megestrol (MEGACE) 20 MG tablet Take 1 tablet (20 mg total) by mouth daily. 90 tablet 1   pantoprazole (PROTONIX) 40 MG tablet Take 1 tablet (40 mg total) by mouth daily. 90 tablet 1   rosuvastatin (CRESTOR) 20 MG tablet Take 1 tablet (20 mg total) by mouth daily. 90 tablet 1   topiramate (TOPAMAX) 50 MG tablet Take 1 tablet (50 mg total) by mouth 2 (two) times daily. 60 tablet 0   Ubiquinone (ULTRA COQ10 PO) Take 100 mg by mouth in the morning and at bedtime.     vitamin C (ASCORBIC ACID) 500 MG tablet Take 500 mg by mouth daily.     No current facility-administered medications on file prior to visit.    Allergies:   Allergies  Allergen Reactions   Codeine Nausea And Vomiting   Lisinopril Swelling   Morphine Nausea And Vomiting   Other    Sulfa Antibiotics Nausea Only and Rash   Sulfonamide Derivatives Nausea Only and Rash    Physical Exam There were no vitals filed for this visit.  There is no height or weight on file to  calculate BMI.   General: Very pleasant frail  elderly Caucasian lady seated, in no evident distress Head: head normocephalic and atraumatic.   Neck: supple with soft right greater than left carotid bruits. Cardiovascular: Regular rate and rhythm. No murmur. Musculoskeletal: no deformity Skin:  no rash/petichiae Vascular:  Normal pulses all extremities  Neurologic Exam Mental Status: Awake and fully alert.  Fluent speech and language.  Oriented to place and time. Recent and remote memory intact. Attention span, concentration and fund of knowledge appropriate. Mood and affect appropriate.  Cranial Nerves: Pupils equal, briskly reactive to light. Extraocular movements full without nystagmus. Visual fields full to confrontation. Hearing diminished bilaterally.  Facial sensation intact. Face, tongue, palate moves normally and symmetrically.  Motor: Normal bulk and tone. Normal strength in all tested extremity muscles. Sensory.: intact to touch , pinprick , position and vibratory sensation.  Coordination: Rapid alternating movements normal in all extremities. Finger-to-nose and heel-to-shin performed accurately bilaterally. Gait and Station: Arises from chair without difficulty. Stance is normal. Gait demonstrates normal stride length and balance . Able to heel, toe and tandem walk with mild difficulty.  Reflexes: 1+ and symmetric. Toes downgoing.       ASSESSMENT/PLAN: 88 year old Caucasian lady with a large right basal ganglia infarct in February 2020 of cryptogenic etiology with recurrent  episodes of transient visual disturbances followed by mild headache likely atypical  migraine rather than TIAs.      1.  Atypical migraines -Increase Topamax from 25 mg twice daily to 50 mg twice daily for migraine prevention -Trial Ubrelvy for acute migraine management -provided samples and advised to call office if beneficial.  If no benefit, would recommend trialing Nurtec.  She is not candidate for triptans with prior stroke history -Encouraged  migraine diary to further identify possible triggers   2. Hx of R BG stroke, cryptogenic  -Continue aspirin 81 mg daily and Crestor 20 mg daily for secondary stroke prevention measures -Recorder has not shown atrial fibrillation thus far -Routine follow-up with PCP for aggressive stroke risk factor management - maintain aggressive risk factor modification with strict control of hypertension with blood pressure goal below 130/90, and lipids with LDL cholesterol goal below 70 mg percent.   -Lipid panel 02/09/2021 LDL 95 - Crestor dosage increased to 42m daily   Follow-up in 3 months or call earlier if needed    CC:  MChevis Pretty FNP   I spent 34 minutes of face-to-face and non-face-to-face time with patient and daughter.  This included previsit chart review, lab review, study review, order entry, electronic health record documentation, patient and daughter education and discussion regarding atypical migraines and use of medications, prior history of stroke, secondary stroke prevention measures and aggressive stroke risk factor management, and answered all other questions to patient daughter satisfaction  JFrann Rider AGNP-BC  [facility] [address] [address] Phone [phone] [phone]: This document was prepared with digital dictation and possible smart phrase technology. Any transcriptional errors that result from this process are unintentional.

## 2021-10-31 NOTE — Progress Notes (Signed)
Carelink Summary Report / Loop Recorder 

## 2021-11-02 ENCOUNTER — Telehealth: Payer: Self-pay | Admitting: Cardiology

## 2021-11-02 NOTE — Telephone Encounter (Signed)
Returned call to patient and made patient aware that per Dr. Martinique it is okay for patient to ILR removed. Will forward message to Dr. Jackalyn Lombard staff and scheduler to get patient scheduled to have the ILR removed. Patient aware and verbalized understanding.  ? ?Martinique, Peter M, MD ?to Me  Pugh, Lucianne Muss D, LPN   ?  16:38 AM ?Yes that is fine with me. Please arrange follow up with EP  ? ?PJ  ? ?

## 2021-11-02 NOTE — Telephone Encounter (Signed)
Patient's calling in bout her recorder, wondering if dr Martinique reach out to dr allred bout getting it remove. Please advise ?

## 2021-11-02 NOTE — Telephone Encounter (Signed)
Returned call to patient who states that she had spoken to Dr. Martinique at her recent appointment about having her ILR removed. Patient states she would like to go ahead with that. Advised patient I would forward message to Dr. Martinique to get okay to have ILR removed and will forward to Dr. Rayann Heman and nurse if needed. Patient verbalized understanding.  ?

## 2021-11-21 ENCOUNTER — Telehealth: Payer: Self-pay | Admitting: Nurse Practitioner

## 2021-11-22 NOTE — Telephone Encounter (Signed)
Attmepted to call patient on 11/21/21 around 4:30 with no answer ?Attempted again to call on 11/22/21 around 4:20 pm with no answer ? ?

## 2021-11-24 ENCOUNTER — Ambulatory Visit: Payer: Medicare Other | Admitting: Nurse Practitioner

## 2021-11-25 ENCOUNTER — Ambulatory Visit: Payer: Medicare Other | Admitting: Nurse Practitioner

## 2021-11-25 ENCOUNTER — Encounter: Payer: Self-pay | Admitting: Nurse Practitioner

## 2021-11-25 VITALS — BP 132/72 | HR 84 | Ht 64.0 in | Wt 121.2 lb

## 2021-11-25 DIAGNOSIS — R634 Abnormal weight loss: Secondary | ICD-10-CM

## 2021-11-25 DIAGNOSIS — K5900 Constipation, unspecified: Secondary | ICD-10-CM

## 2021-11-25 DIAGNOSIS — K625 Hemorrhage of anus and rectum: Secondary | ICD-10-CM

## 2021-11-25 DIAGNOSIS — K648 Other hemorrhoids: Secondary | ICD-10-CM

## 2021-11-25 NOTE — Patient Instructions (Addendum)
Constipation and rectal bleeding:  ?Start daily Benefiber ?Miralax 1 capful in 8 oz water for one week ?Anusol cream PR x 10 day  ?Follow up with me in 4 weeks ? ?We have scheduled you a follow up with Tye Savoy, NP on 12/16/21 at 2:00pm. ? ?Thank you for trusting me with your gastrointestinal care!   ? ?Tye Savoy, NP ? ? ? ? ?BMI: ? ?If you are age 86 or older, your body mass index should be between 23-30. Your Body mass index is 20.81 kg/m?Marland Kitchen If this is out of the aforementioned range listed, please consider follow up with your Primary Care Provider. ? ?If you are age 67 or younger, your body mass index should be between 19-25. Your Body mass index is 20.81 kg/m?Marland Kitchen If this is out of the aformentioned range listed, please consider follow up with your Primary Care Provider.  ? ?MY CHART: ? ?The [place] [facility] providers would like to encourage you to use [facility] to communicate with providers for non-urgent requests or questions.  Due to long hold times on the telephone, sending your provider a message by [facility] may be a faster and more efficient way to get a response.  Please allow 48 business hours for a response.  Please remember that this is for non-urgent requests.  ? ?

## 2021-11-25 NOTE — Progress Notes (Signed)
? ? ?ASSESSMENT :   ? ?Patient profile:  ?Christina Silva is a very pleasant 88 y.o. female known remotely to Dr. Carlean Purl. Past medical history significant for HTN, HLD, pacemaker placement, CVA migraines, anxiety, GERD, cholecystectomy. See PMH below for any additional history ? ?# 6 week history of constipation ( scybalous stools) with associated minor rectal bleeding. Internal and external hemorrhoids on exam. Suspect hemorrhoidal bleeding in setting of constipation.  ? ?# Unintentional weight loss of 11 pounds since late August. Now up 3 pounds since starting Megace last month. Patient thinks it was anxiety related, possibly seasonal depression ? ?PLAN:    ? ?Start daily Benefiber ?Miralax 1 capful in 8 oz water for one week ?Anusol cream PR x 10 day  ?Follow up with me in 4 weeks. If not improving, consider colonoscopy.  ?If starts losing weight again then will need further workup.  ? ?History of Present Illness:  ? ?Chief complaint:  bowel changes ? ?Christina Silva comes in with bowel changes over last 6 months. . Having bowel movements consisting of small pieces of stool like " M&Ms). She has seen bright red blood on tissue a few times over the last 6 months which she thinks is from hemorrhoids.  Additionally she has unintentionally lost about 15 pounds over last several months. Now nausea, vomiting or abdominal pain. She is now on a appetite stimulant and has gained a few pounds. Her appetite has improved. She thinks anxiety contributed to weight loss. Feeling better now with weather changing. A nurse told she may have seasonal depression.  ? ?She thinks constipation may be related to her diet. She drinks about three 16 oz bottles of water a day.  A month ago she took Miralax for a week with significant improvement in bowel movements. She didn't realize it could be taken for more than 7 days at a time.  ? ?Her Last colonoscopy was ~ 2010.  ? ?Previous Labs / Imaging:: ? ?  Latest Ref Rng & Units 10/04/2021  ?  12:31 PM 02/09/2021  ?  8:51 AM 09/02/2020  ? 12:06 PM  ?CBC  ?WBC 3.4 - 10.8 x10E3/uL 5.4   6.4   6.5    ?Hemoglobin 11.1 - 15.9 g/dL 14.4   15.1   14.3    ?Hematocrit 34.0 - 46.6 % 43.8   44.6   42.1    ?Platelets 150 - 450 x10E3/uL 277   297   337    ? ? ?Lab Results  ?Component Value Date  ? LIPASE 38 04/09/2019  ? ? ?  Latest Ref Rng & Units 10/04/2021  ? 12:31 PM 06/14/2021  ?  9:44 AM 02/09/2021  ?  8:51 AM  ?CMP  ?Glucose 70 - 99 mg/dL 99    92    ?BUN 8 - 27 mg/dL 10    6    ?Creatinine 0.57 - 1.00 mg/dL 0.70    0.68    ?Sodium 134 - 144 mmol/L 145    146    ?Potassium 3.5 - 5.2 mmol/L 3.9    4.2    ?Chloride 96 - 106 mmol/L 107    109    ?CO2 20 - 29 mmol/L 21    24    ?Calcium 8.7 - 10.3 mg/dL 9.7    10.0    ?Total Protein 6.0 - 8.5 g/dL 6.4   6.9   6.3    ?Total Bilirubin 0.0 - 1.2 mg/dL 0.5   0.4   0.5    ?  Alkaline Phos 44 - 121 IU/L 85   77   80    ?AST 0 - 40 IU/L '20   17   18    '$ ?ALT 0 - 32 IU/L '14   10   14    '$ ? ? ?Previous GI Evaluations;   ? ?Endoscopies: ?June 2010 screening colonoscopy  ?--normal except for transverse colon diverticulum ? ? ?Past Medical History:  ?Diagnosis Date  ? Anxiety   ? Cataract   ? GERD (gastroesophageal reflux disease)   ? Hyperlipidemia   ? Hypertension   ? SCCA (squamous cell carcinoma) of skin 11/02/2020  ? Mid Chest (in situ) (tx p bx)  ? SCCA (squamous cell carcinoma) of skin 11/02/2020  ? Right Forearm Posterior (in situ) (tx p bx)  ? SCCA (squamous cell carcinoma) of skin 11/02/2020  ? Left Submandibular Area (in situ) (tx p bx)  ? Stroke [facility]) 09/2018  ? Vertigo   ? patient denies  ? ?Past Surgical History:  ?Procedure Laterality Date  ? ABDOMINAL HYSTERECTOMY  1980  ? CATARACT EXTRACTION Right   ? CHOLECYSTECTOMY N/A 12/16/2019  ? Procedure: LAPAROSCOPIC CHOLECYSTECTOMY WITH INTRAOPERATIVE CHOLANGIOGRAM;  Surgeon: Erroll Luna, MD;  Location: [facility];  Service: General;  Laterality: N/A;  ? EYE SURGERY    ? implantable loop recorder placement  11/12/2020  ?   Medtronic Reveal Linq model [id number] implantable loop recorder (SN [id number]) implanted by Dr Rayann Heman for cryptogenic stroke  ? KNEE ARTHROSCOPY Left   ? ROTATOR CUFF REPAIR Left   ? SQUAMOUS CELL CARCINOMA EXCISION Left   ? TONSILLECTOMY    ? ?Family History  ?Problem Relation Age of Onset  ? Heart attack Mother   ? Hyperlipidemia Mother   ? Heart disease Mother   ? Transient ischemic attack Father   ? Stroke Father   ? Psychiatric Illness Sister   ? Heart disease Sister   ? Arthritis Brother   ? Epilepsy Brother   ? Colon cancer Neg Hx   ? Esophageal cancer Neg Hx   ? Rectal cancer Neg Hx   ? Stomach cancer Neg Hx   ? Pancreatic cancer Neg Hx   ? ?Social History  ? ?Tobacco Use  ? Smoking status: Former  ?  Types: Cigarettes  ?  Quit date: 01/09/1983  ?  Years since quitting: 38.9  ? Smokeless tobacco: Never  ?Vaping Use  ? Vaping Use: Never used  ?Substance Use Topics  ? Alcohol use: No  ? Drug use: No  ? ?Current Outpatient Medications  ?Medication Sig Dispense Refill  ? amLODipine (NORVASC) 2.5 MG tablet Take 1 tablet (2.5 mg total) by mouth every evening. 90 tablet 1  ? aspirin EC 81 MG tablet Take 81 mg by mouth daily. Swallow whole.    ? cholecalciferol (VITAMIN D) 25 MCG (1000 UNIT) tablet Take 2,000 Units by mouth daily.    ? clonazePAM (KLONOPIN) 0.5 MG tablet Take 1 tablet (0.5 mg total) by mouth 2 (two) times daily as needed for anxiety. 60 tablet 1  ? Coenzyme Q10 (CO Q 10 PO) Take by mouth.    ? escitalopram (LEXAPRO) 20 MG tablet Take 1 tablet (20 mg total) by mouth daily. 90 tablet 1  ? losartan (COZAAR) 100 MG tablet Take 1 tablet (100 mg total) by mouth daily. 90 tablet 1  ? megestrol (MEGACE) 20 MG tablet Take 1 tablet (20 mg total) by mouth daily. 90 tablet 1  ? pantoprazole (PROTONIX) 40 MG tablet Take  1 tablet (40 mg total) by mouth daily. 90 tablet 1  ? rosuvastatin (CRESTOR) 20 MG tablet Take 1 tablet (20 mg total) by mouth daily. 90 tablet 1  ? ?No current facility-administered medications for  this visit.  ? ?Allergies  ?Allergen Reactions  ? Codeine Nausea And Vomiting  ? Lisinopril Swelling  ? Morphine Nausea And Vomiting  ? Other   ? Sulfa Antibiotics Nausea Only and Rash  ? Sulfonamide Derivatives Nausea Only and Rash  ? ? ? ?Review of Systems: ?All systems reviewed and negative except where noted in HPI.  ? ?Physical Exam:   ? ?Wt Readings from Last 3 Encounters:  ?11/25/21 121 lb 4 oz (55 kg)  ?10/04/21 118 lb 9.6 oz (53.8 kg)  ?10/03/21 119 lb 3.2 oz (54.1 kg)  ? ? ?BP 132/72   Pulse 84   Ht '5\' 4"'$  (1.626 m)   Wt 121 lb 4 oz (55 kg)   BMI 20.81 kg/m?  ?Constitutional:  Generally well appearing female in no acute distress. ?Psychiatric: Pleasant. Normal mood and affect. Behavior is normal. ?EENT: Pupils normal.  Conjunctivae are normal. No scleral icterus. ?Neck supple.  ?Cardiovascular: Normal rate, regular rhythm. No edema ?Pulmonary/chest: Effort normal and breath sounds normal. No wheezing, rales or rhonchi. ?Abdominal: Soft, nondistended, nontender. Bowel sounds active throughout. There are no masses palpable. No hepatomegaly. ?Rectal: external hemorrhoids. Decreased resting sphincter tone. On anoscopy there were swollen internal hemorrhoids ?Neurological: Alert and oriented to person place and time. ?Skin: Skin is warm and dry. No rashes noted. ? ?Tye Savoy, NP  11/25/2021, 10:10 AM ? ? ? ? ? ? ? ? ? ? ?

## 2021-11-28 LAB — CUP PACEART REMOTE DEVICE CHECK
Date Time Interrogation Session: 20230331230735
Implantable Pulse Generator Implant Date: 20220322

## 2021-11-28 NOTE — Telephone Encounter (Signed)
Please let patient know I have tried to call her several times and could not reach her. I saw where she saw GI. Did they address her issues. ?

## 2021-11-29 ENCOUNTER — Encounter: Payer: Self-pay | Admitting: Adult Health

## 2021-11-29 ENCOUNTER — Ambulatory Visit: Payer: Medicare Other | Admitting: Adult Health

## 2021-11-29 VITALS — BP 133/66 | HR 70 | Ht 64.0 in | Wt 124.0 lb

## 2021-11-29 DIAGNOSIS — M5481 Occipital neuralgia: Secondary | ICD-10-CM

## 2021-11-29 DIAGNOSIS — G43009 Migraine without aura, not intractable, without status migrainosus: Secondary | ICD-10-CM

## 2021-11-29 DIAGNOSIS — I639 Cerebral infarction, unspecified: Secondary | ICD-10-CM

## 2021-11-29 NOTE — Patient Instructions (Addendum)
Your Plan: ? ?Continue current regimen for stroke prevention  measures and close follow-up with PCP for aggressive stroke risk factor management ? ?As you have not had any recent headaches, will hold off on trying any other headache medication at this time ? ?Continue to monitor left sided head pain - suspicious you may have some form of occipital neuralgia but as long as symptoms are intermittent and not overly bothersome, we can hold off on treatment for now ? ? ? ? ? ? ?Thank you for coming to see [place] at [facility]. I hope we have been able to provide you high quality care today. ? ?You may receive a patient satisfaction survey over the next few weeks. We would appreciate your feedback and comments so that we may continue to improve ourselves and the health of our patients. ? ? ? ? ?Occipital Neuralgia ?Occipital neuralgia is a type of headache that causes brief episodes of very bad pain in the back of the head. Pain from occipital neuralgia may spread (radiate) to other parts of the head. ?These headaches may be caused by irritation of the nerves that leave the spinal cord high up in the neck, just below the base of the skull (occipital nerves). The occipital nerves transmit sensations from the back of the head, the top of the head, and the areas behind the ears. ?What are the causes? ?This condition can occur without any known cause (primary headache syndrome). In other cases, this condition is caused by pressure on or irritation of one of the two occipital nerves. Pressure and irritation may be due to: ?Muscle spasm in the neck. ?Neck injury. ?Wear and tear of the vertebrae in the neck (osteoarthritis). ?Disease of the disks that separate the vertebrae. ?Swollen blood vessels that put pressure on the occipital nerves. ?Infections. ?Tumors. ?Diabetes. ?What are the signs or symptoms? ?This condition causes brief burning, stabbing, electric, shocking, or shooting pain in the back of the head  that can radiate to the top of the head. It can happen on one side or both sides of the head. It can also cause: ?Pain behind the eye. ?Pain triggered by neck movement or hair brushing. ?Scalp tenderness. ?Aching in the back of the head between episodes of very bad pain. ?Pain that gets worse with exposure to bright lights. ?How is this diagnosed? ?Your health care provider may diagnose the condition based on a physical exam and your symptoms. Tests may be done, such as: ?Imaging studies of the brain and neck (cervical spine), such as an MRI or CT scan. These look for causes of pinched nerves. ?Applying pressure to the nerves in the neck to try to re-create the pain. ?Injection of numbing medicine into the occipital nerve areas to see if pain goes away (diagnostic nerve block). ?How is this treated? ?Treatment for this condition may begin with simple measures, such as: ?Rest. ?Massage. ?Applying heat or cold to the area. ?Over-the-counter pain relievers. ?If these measures do not work, you may need other treatments, including: ?Medicines, such as: ?Prescription-strength anti-inflammatory medicines. ?Muscle relaxants. ?Anti-seizure medicines, which can relieve pain. ?Antidepressants, which can relieve pain. ?Injected medicines, such as medicines that numb the area (local anesthetic) and steroids. ?Pulsed radiofrequency ablation. This is when wires are implanted to deliver electrical impulses that block pain signals from the occipital nerve. ?Surgery to relieve nerve pressure. ?Physical therapy. ?Follow these instructions at home: ?Managing pain ?  ?Avoid any activities that cause pain. ?Rest when you have an attack  of pain. ?Try gentle massage to relieve pain. ?Try a different pillow or sleeping position. ?If directed, apply heat to the affected area as often as told by your health care provider. Use the heat source that your health care provider recommends, such as a moist heat pack or a heating pad. ?Place a towel  between your skin and the heat source. ?Leave the heat on for 20-30 minutes. ?Remove the heat if your skin turns bright red. This is especially important if you are unable to feel pain, heat, or cold. You have a greater risk of getting burned. ?If directed, put ice on the back of your head and neck area. To do this: ?Put ice in a plastic bag. ?Place a towel between your skin and the bag. ?Leave the ice on for 20 minutes, 2-3 times a day. ?Remove the ice if your skin turns bright red. This is very important. If you cannot feel pain, heat, or cold, you have a greater risk of damage to the area. ?General instructions ?Take over-the-counter and prescription medicines only as told by your health care provider. ?Avoid things that make your symptoms worse, such as bright lights. ?Try to stay active. Get regular exercise that does not cause pain. Ask your health care provider to suggest safe exercises for you. ?Work with a physical therapist to learn stretching exercises you can do at home. ?Practice good posture. ?Keep all follow-up visits. This is important. ?Contact a health care provider if: ?Your medicine is not working. ?You have new or worsening symptoms. ?Get help right away if: ?You have very bad head pain that does not go away. ?You have a sudden change in vision, balance, or speech. ?These symptoms may represent a serious problem that is an emergency. Do not wait to see if the symptoms will go away. Get medical help right away. Call your local emergency services (911 in the [place] Do not drive yourself to the hospital. ?Summary ?Occipital neuralgia is a type of headache that causes brief episodes of very bad pain in the back of the head. ?Pain from occipital neuralgia may spread (radiate) to other parts of the head. ?Treatment for this condition includes rest, massage, and medicines. ?This information is not intended to replace advice given to you by your health care provider. Make sure you discuss any questions  you have with your health care provider. ?Document Revised: 06/13/2020 Document Reviewed: 06/13/2020 ?Elsevier Patient Education ? [place]. ? ?

## 2021-11-29 NOTE — Progress Notes (Signed)
?[facility] ?[address] ?[place] [place] [address] ?[phone] ? ?     OFFICE FOLLOW UP VISIT NOTE ? ?Christina Silva ?Date of Birth:  1932/09/20 ?Medical Record Number:  [id number]  ? ?Primary neurologist: Dr. Leonie Silva ?Referring MD: Christina Pretty, FNP ? ?Reason for Referral: Stroke 2nd opinion ? ?Chief Complaint  ?Patient presents with  ? Follow-up  ?  Rm 3 with daughter Christina Silva  ?Pt is well, hasnt had a migraine since around christmas, but having pain behind ear.   No new stroke concerns   ?  ? ? ?HPI: ? ? Initial visit 10/19/2020 Dr. Leonie Silva: Christina Silva is a 88 year old pleasant Caucasian lady seen today for initial office consultation visit. She is accompanied by her daughter. History is obtained from them and review of electronic medical records and up reviewed personally pertinent imaging films in PACS. She has past medical history of anxiety, hypertension, hyperlipidemia and gastroesophageal reflux disease. She was admitted to [facility] on 10/18/2018 with sudden onset of left upper extremity weakness and numbness. MRI scan at that time it showed a large 2 cm right basal ganglia infarct and MRI of the brain had shown moderate left P2 stenosis. Echocardiogram was unremarkable. She was started on aspirin and Plavix and will saw Dr. Merlene Silva. She states she did well from the stroke and made good recovery. 6 months ago she had several episodes of transient left hemifield vision loss along with the dull headache which lasted 15 minutes and she also saw flashing lights. This occurred once daily for 3 days. 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 However MRA of the brain done on 09/01/2020 showed a new right posterior cerebral artery occlusion close to its origin and stable appearance of the moderate left P1/P2 stenosis. Prior carotid ultrasound on 04/22/2020 had shown no  significant extracranial stenosis. Patient is not sure when her last lipid profile or A1c was checked. She is on aspirin and Plavix and tolerating it well without major bleeding but does have frequent bruising. On inquiry she admits to a few episodes of transient horizontal diplopia that she has had in the last few months. This has occurred mostly when she is driving she can see 2 lines instead of one and on one occasion she had to pull off the road and sit in [place] parking lot till this improved. She has been scared to drive since then. She has seen Dr. Merlene Silva in the past and he had suggested doing a 30-day heart monitor but for unclear reason that this never happened and patient was unhappy and is here to see me for a 2nd opinion. ?Update 02/08/2021 Dr. Leonie Silva; she returns for follow-up after last visit on 10/19/2020.  She is accompanied by her daughter Christina Silva.  Patient states she has had 3 episodes of brief visual disturbance followed by mild headache on May 9, May 10 and June 11.  They were all stereotypical and similar.  Whole episode lasted 10 to 15 minutes.  She just noticed some visual disturbance in the left eye which moved across to the right eye.  She could still see.  She had a mild dull headache later.  There were no clear triggers or precipitating factors that she could identify.  Patient had lab work at last visit on 10/19/2020 which showed ESR of 14 mm.  LDL cholesterol 111 mg percent and triglycerides 156.  A hemoglobin A1c was 5.8.  I recommended we increase the dose of Crestor to 10 mg but patient's primary physician increased to 20 mg which she did not tolerate and developed muscle aches and pains and it was discontinued.  She has been since restarted on 5 mg of Crestor which she is so far tolerating well.  She was also started on Topamax by Dr. Merlene Silva neurologist but she is taking it only as as needed.  She is tolerating aspirin well without any bruising or bleeding.  Blood pressure is  usually well controlled today slightly elevated in office at 152/74.  She has no new complaints.  She had outpatient loop recorder placed by Dr. Rayann Silva and so for paroxysmal A. fib has not yet been found. ? ?Update 05/19/2021 Christina Silva: Returns for 20-monthfollow-up after prior visit with Dr. SLeonie Silva  She is accompanied by her daughter.  Reports 1 episode last week of flashing lights in vision followed by typical headache as well as 4 episodes of double vision while in the car (2 while driving, 2 while riding) lasting 3-4 min followed by typical headache.  She reports experiencing double vision intermittently over the past year.  Routinely followed by ophthalmology.  Has remained on topiramate 25 mg twice daily tolerating well but does not believe this is providing much benefit.  Denies new neurological symptoms.  Remains on aspirin and Crestor 20 mg daily without side effects.  Blood pressure today elevated at 162/80. Routinely monitor at home and typically stable around 120s/60-70s. Loop recorder has not shown atrial fibrillation thus far.  No new concerns at this time. ? ? ? ?Update 11/29/2021 Christina Silva: Patient returns for follow-up visit after prior visit 6 months ago accompanied by her daughter.  She has been doing well since prior visit.   ? ?She has not had any recent migraine headaches since around Christmas. She has since stopped taking topiramate 50 mg twice daily beginning of this year due to having no headaches and no benefit even with increased dose. She did not try UIranas insurance would not cover (PA declined thru PCP office). ? ?She does c/o pain behind left ear only when putting pressure on it such as laying head down on pillow and occasional zap type sensation of left side of head near midline, will resolve once she relieves pressure, present over the past 3-4 months.  Denies any traumatic event, fall or whiplash ? ?She has not had any additional double vision since she was seen by [facility] and started to use prism  lenses (was told this was from weakened eye muscle from age). Did have MRI 06/2021 due to diplopia which did not show any new findings.   ? ?Compliant on aspirin and Crestor, denies side effects.  Blood pressure today 133/66.  Loop recorder has not shown atrial fibrillation thus far.  She is scheduled for explant of ILR on 4/6 with Dr. ARayann Silva  ? ?No further concerns at this time. ? ? ? ? ? ?ROS:   ?14 system review of systems is positive for those listed in HPI and all other systems negative ? ?PMH:  ?Past Medical History:  ?Diagnosis Date  ? Anxiety   ? Cataract   ? GERD (gastroesophageal reflux disease)   ? Hyperlipidemia   ? Hypertension   ? SCCA (squamous cell carcinoma) of skin 11/02/2020  ? Mid Chest (in situ) (tx p bx)  ? SCCA (squamous cell carcinoma) of skin 11/02/2020  ? Right Forearm Posterior (in situ) (tx  p bx)  ? SCCA (squamous cell carcinoma) of skin 11/02/2020  ? Left Submandibular Area (in situ) (tx p bx)  ? Stroke [facility]) 09/2018  ? Vertigo   ? patient denies  ? ? ?Social History:  ?Social History  ? ?Socioeconomic History  ? Marital status: Widowed  ?  Spouse name: Not on file  ? Number of children: 2  ? Years of education: 10  ? Highest education level: Not on file  ?Occupational History  ? Occupation: retired  ?Tobacco Use  ? Smoking status: Former  ?  Types: Cigarettes  ?  Quit date: 01/09/1983  ?  Years since quitting: 38.9  ? Smokeless tobacco: Never  ?Vaping Use  ? Vaping Use: Never used  ?Substance and Sexual Activity  ? Alcohol use: No  ? Drug use: No  ? Sexual activity: Yes  ?Other Topics Concern  ? Not on file  ?Social History Narrative  ? Lives alone handicap accessible home - daughter in [place] and son in [place].  ? Right Handed  ? Drinks caffeine occassionally  ? ?Social Determinants of Health  ? ?Financial Resource Strain: Low Risk   ? Difficulty of Paying Living Expenses: Not hard at all  ?Food Insecurity: No Food Insecurity  ? Worried About Charity fundraiser in the Last  Year: Never true  ? Ran Out of Food in the Last Year: Never true  ?Transportation Needs: No Transportation Needs  ? Lack of Transportation (Medical): No  ? Lack of Transportation (Non-Medical): No  ?Physical Ac

## 2021-12-01 ENCOUNTER — Encounter: Payer: Self-pay | Admitting: Internal Medicine

## 2021-12-01 ENCOUNTER — Telehealth: Payer: Self-pay

## 2021-12-01 ENCOUNTER — Ambulatory Visit: Payer: Medicare Other | Admitting: Internal Medicine

## 2021-12-01 VITALS — BP 146/74 | HR 74 | Ht 64.0 in | Wt 122.0 lb

## 2021-12-01 DIAGNOSIS — Z9889 Other specified postprocedural states: Secondary | ICD-10-CM | POA: Diagnosis not present

## 2021-12-01 DIAGNOSIS — I639 Cerebral infarction, unspecified: Secondary | ICD-10-CM | POA: Diagnosis not present

## 2021-12-01 NOTE — Telephone Encounter (Signed)
Per Dr. Rayann Heman the patient do not want to be followed remotely. She will be checked in office when she see dr. Martinique. I have canceled all upcoming remotes and took her out of [facility]. I marked her inactive in [facility].  ?

## 2021-12-01 NOTE — Progress Notes (Signed)
? ?PCP: [name], FNP ?Primary Cardiologist: Dr Martinique ?Primary EP: Dr Rayann Heman ? ?Christina Silva is a 88 y.o. female who presents today for routine electrophysiology followup.  Since last being seen in our clinic, the patient reports doing very well.  Today, she denies symptoms of palpitations, chest pain, shortness of breath,  lower extremity edema, dizziness, presyncope, or syncope.  The patient is otherwise without complaint today.  Her concern today is with copay for ILR monitoring.  She denies pain or issues at the device sight. ? ?Past Medical History:  ?Diagnosis Date  ? Anxiety   ? Cataract   ? GERD (gastroesophageal reflux disease)   ? Hyperlipidemia   ? Hypertension   ? SCCA (squamous cell carcinoma) of skin 11/02/2020  ? Mid Chest (in situ) (tx p bx)  ? SCCA (squamous cell carcinoma) of skin 11/02/2020  ? Right Forearm Posterior (in situ) (tx p bx)  ? SCCA (squamous cell carcinoma) of skin 11/02/2020  ? Left Submandibular Area (in situ) (tx p bx)  ? Stroke [facility]) 09/2018  ? Vertigo   ? patient denies  ? ?Past Surgical History:  ?Procedure Laterality Date  ? ABDOMINAL HYSTERECTOMY  1980  ? CATARACT EXTRACTION Right   ? CHOLECYSTECTOMY N/A 12/16/2019  ? Procedure: LAPAROSCOPIC CHOLECYSTECTOMY WITH INTRAOPERATIVE CHOLANGIOGRAM;  Surgeon: Erroll Luna, MD;  Location: [facility];  Service: General;  Laterality: N/A;  ? EYE SURGERY    ? implantable loop recorder placement  11/12/2020  ?  Medtronic Reveal Linq model [id number] implantable loop recorder (SN [id number]) implanted by Dr Rayann Heman for cryptogenic stroke  ? KNEE ARTHROSCOPY Left   ? ROTATOR CUFF REPAIR Left   ? SQUAMOUS CELL CARCINOMA EXCISION Left   ? TONSILLECTOMY    ? ? ?ROS- all systems are reviewed and negatives except as per HPI above ? ?Current Outpatient Medications  ?Medication Sig Dispense Refill  ? amLODipine (NORVASC) 2.5 MG tablet Take 1 tablet (2.5 mg total) by mouth every evening. 90 tablet 1  ? aspirin EC 81 MG tablet Take 81 mg  by mouth daily. Swallow whole.    ? buPROPion (WELLBUTRIN SR) 150 MG 12 hr tablet Take 150 mg by mouth daily.    ? cholecalciferol (VITAMIN D) 25 MCG (1000 UNIT) tablet Take 2,000 Units by mouth daily.    ? clonazePAM (KLONOPIN) 0.5 MG tablet Take 1 tablet (0.5 mg total) by mouth 2 (two) times daily as needed for anxiety. 60 tablet 1  ? Coenzyme Q10 (CO Q 10 PO) Take by mouth.    ? escitalopram (LEXAPRO) 20 MG tablet Take 1 tablet (20 mg total) by mouth daily. 90 tablet 1  ? losartan (COZAAR) 100 MG tablet Take 1 tablet (100 mg total) by mouth daily. 90 tablet 1  ? megestrol (MEGACE) 20 MG tablet Take 1 tablet (20 mg total) by mouth daily. 90 tablet 1  ? pantoprazole (PROTONIX) 40 MG tablet Take 1 tablet (40 mg total) by mouth daily. 90 tablet 1  ? rosuvastatin (CRESTOR) 20 MG tablet Take 1 tablet (20 mg total) by mouth daily. 90 tablet 1  ? ?No current facility-administered medications for this visit.  ? ? ?Physical Exam: ?Vitals:  ? 12/01/21 1635  ?BP: (!) 146/74  ?Pulse: 74  ?SpO2: 96%  ?Weight: 122 lb (55.3 kg)  ?Height: '5\' 4"'$  (1.626 m)  ? ? ?GEN- The patient is well appearing, alert and oriented x 3 today.   ?Head- normocephalic, atraumatic ?Eyes-  Sclera clear, conjunctiva pink ?Ears- hearing  intact ?Oropharynx- clear ?Lungs- Clear to ausculation bilaterally, normal work of breathing ?Heart- Regular rate and rhythm, no murmurs, rubs or gallops, PMI not laterally displaced ?GI- soft, NT, ND, + BS ?Extremities- no clubbing, cyanosis, or edema ? ?Wt Readings from Last 3 Encounters:  ?12/01/21 122 lb (55.3 kg)  ?11/29/21 124 lb (56.2 kg)  ?11/25/21 121 lb 4 oz (55 kg)  ? ?  ? ?Assessment and Plan: ? ?Cryptogenic stroke ?Monitored with ILR without AF detected.  She would like to have her ILR removed based solely on costs of copays related to remote monitoring.  I had a long conversation today with patient and her daughter.  They are aware that we could remove the ILR or that we could keep it in place but  discontinue remote monitoring.  This second option would allow [place] to continue to record data which could be viewed on in person device interrogation.  She finds this option most appealing.  I had a very frank discussion that we would not have access to remote transmissions and therefore could not respond acutely to arrhythmias such as afib should they occur.  We agreed however that this option was probably still better than removing the device at this time.  She therefore wishes to keep her ILR in place.  She will ask Dr Martinique to interrogate it when she comes in for routine office visits. ? ?2. HTN ?Stable ?No change required today ? ?3. HL ?Stable ?No change required today ? ?Follow-up with Dr Martinique as scheduled ?I will see as needed. ?

## 2021-12-01 NOTE — Patient Instructions (Signed)
Medication Instructions:  °Your physician recommends that you continue on your current medications as directed. Please refer to the Current Medication list given to you today. °*If you need a refill on your cardiac medications before your next appointment, please call your pharmacy* ° °Lab Work: °None. °If you have labs (blood work) drawn today and your tests are completely normal, you will receive your results only by: °MyChart Message (if you have MyChart) OR °A paper copy in the mail °If you have any lab test that is abnormal or we need to change your treatment, we will call you to review the results. ° °Testing/Procedures: °None. ° °Follow-Up: °At [facility], you and your health needs are our priority.  As part of our continuing mission to provide you with exceptional heart care, we have created designated Provider Care Teams.  These Care Teams include your primary Cardiologist (physician) and Advanced Practice Providers (APPs -  Physician Assistants and Nurse Practitioners) who all work together to provide you with the care you need, when you need it. ° °Your physician wants you to follow-up in: As needed with Dr. Allred.  ° °We recommend signing up for the patient portal called "MyChart".  Sign up information is provided on this After Visit Summary.  MyChart is used to connect with patients for Virtual Visits (Telemedicine).  Patients are able to view lab/test results, encounter notes, upcoming appointments, etc.  Non-urgent messages can be sent to your provider as well.   °To learn more about what you can do with MyChart, go to [email or web address].   ° °Any Other Special Instructions Will Be Listed Below (If Applicable). ° ° ° ° °  ° ° °

## 2021-12-05 ENCOUNTER — Other Ambulatory Visit: Payer: Self-pay | Admitting: Nurse Practitioner

## 2021-12-05 DIAGNOSIS — F411 Generalized anxiety disorder: Secondary | ICD-10-CM

## 2021-12-05 NOTE — Telephone Encounter (Signed)
ntbs - Controlled - has to be done in OV  ?

## 2021-12-05 NOTE — Telephone Encounter (Signed)
Called patient to schedule appt. She is aware that she needs appt and said she will call back later to set up.  ?

## 2021-12-16 ENCOUNTER — Encounter: Payer: Self-pay | Admitting: Nurse Practitioner

## 2021-12-16 ENCOUNTER — Ambulatory Visit: Payer: Medicare Other | Admitting: Nurse Practitioner

## 2021-12-16 VITALS — BP 136/64 | HR 84 | Ht 64.0 in | Wt 122.5 lb

## 2021-12-16 DIAGNOSIS — K5900 Constipation, unspecified: Secondary | ICD-10-CM

## 2021-12-16 DIAGNOSIS — K648 Other hemorrhoids: Secondary | ICD-10-CM

## 2021-12-16 DIAGNOSIS — K625 Hemorrhage of anus and rectum: Secondary | ICD-10-CM

## 2021-12-16 MED ORDER — HYDROCORTISONE (PERIANAL) 2.5 % EX CREA: 1.0000 "application " | TOPICAL_CREAM | Freq: Every day | CUTANEOUS | 1 refills | Status: DC

## 2021-12-16 NOTE — Progress Notes (Signed)
? ? ? ?Assessment  ? ?Patient Profile:  ?Christina Silva is a very pleasant 88 y.o. female known remotely to Dr. Carlean Purl. Past medical history significant for HTN, HLD, pacemaker placement, CVA migraines, anxiety, GERD,  cholecystectomy. See PMH below for any additional history ? ?Constipation, improved after starting fiber  ? ?Rectal bleeding / hemorrhoids seen at recent visit. There was a misunderstanding about the Anusol. She thought it was OTC and couldn't find it at pharmacy so she started using Recticare perianally.  The bleeding has improved, only 2 minor episodes since last visit. Probably hemorrhoidal bleeding which has gotten better with fiber / treatment of constipation.  ? ?Weight loss, improving on Megace.  Patient thinks weight loss was possibly anxiety related ? ? ?Plan  ? ?Anusol cream PR nightly x 10 nights ?Continue daily fiber ?She will continue daily fiber. Can add 1 capful of Miralax daily if needed.  ?After completion of hemorrhoid treatment she will call office with condition update. If bleeding persists then will need to decide on colonoscopy. She understands the risks and benefits of colonoscopy and will be thinking about what she wants to do should the bleeding persist. ? ? ?History of Present Illness  ? ?Chief Complaint : follow up on constipation and rectal bleeding ? ? ?Seen 11/25/21 with constipation, minor rectal bleeding. Hemorrhoids found on exam. Started on fiber and Anusol cream. She had recent weight loss which was already improving on megace.  ? ? ?INTERVAL HISTORY ?Didn't get the Anusol, thought it was over the counter and pharmacy didn't have it . She got Recticare instead. She is taking daly fiber and constipation is better. Stools more solid and formed. She has had only 1-2 two episodes of minor rectal bleeding since I saw her. Still on Megace. Weight slowly improving.  She feels fine, looks well.  No general medical complaints ? ? ?Laboratory data: ? ?  Latest Ref Rng &  Units 10/04/2021  ? 12:31 PM 06/14/2021  ?  9:44 AM 02/09/2021  ?  8:51 AM  ?Hepatic Function  ?Total Protein 6.0 - 8.5 g/dL 6.4   6.9   6.3    ?Albumin 3.6 - 4.6 g/dL 4.5   4.5   4.5    ?AST 0 - 40 IU/L '20   17   18    ' ?ALT 0 - 32 IU/L '14   10   14    ' ?Alk Phosphatase 44 - 121 IU/L 85   77   80    ?Total Bilirubin 0.0 - 1.2 mg/dL 0.5   0.4   0.5    ?Bilirubin, Direct 0.00 - 0.40 mg/dL  0.13     ? ? ? ?  Latest Ref Rng & Units 10/04/2021  ? 12:31 PM 02/09/2021  ?  8:51 AM 09/02/2020  ? 12:06 PM  ?CBC  ?WBC 3.4 - 10.8 x10E3/uL 5.4   6.4   6.5    ?Hemoglobin 11.1 - 15.9 g/dL 14.4   15.1   14.3    ?Hematocrit 34.0 - 46.6 % 43.8   44.6   42.1    ?Platelets 150 - 450 x10E3/uL 277   297   337    ? ? ?Past Medical History:  ?Diagnosis Date  ? Anxiety   ? Cataract   ? GERD (gastroesophageal reflux disease)   ? Hyperlipidemia   ? Hypertension   ? SCCA (squamous cell carcinoma) of skin 11/02/2020  ? Mid Chest (in situ) (tx p bx)  ? SCCA (  squamous cell carcinoma) of skin 11/02/2020  ? Right Forearm Posterior (in situ) (tx p bx)  ? SCCA (squamous cell carcinoma) of skin 11/02/2020  ? Left Submandibular Area (in situ) (tx p bx)  ? Stroke [facility]) 09/2018  ? Vertigo   ? patient denies  ? ? ?Past Surgical History:  ?Procedure Laterality Date  ? ABDOMINAL HYSTERECTOMY  1980  ? CATARACT EXTRACTION Right   ? CHOLECYSTECTOMY N/A 12/16/2019  ? Procedure: LAPAROSCOPIC CHOLECYSTECTOMY WITH INTRAOPERATIVE CHOLANGIOGRAM;  Surgeon: Erroll Luna, MD;  Location: [facility];  Service: General;  Laterality: N/A;  ? EYE SURGERY    ? implantable loop recorder placement  11/12/2020  ?  Medtronic Reveal Linq model [id number] implantable loop recorder (SN [id number]) implanted by Dr Rayann Heman for cryptogenic stroke  ? KNEE ARTHROSCOPY Left   ? ROTATOR CUFF REPAIR Left   ? SQUAMOUS CELL CARCINOMA EXCISION Left   ? TONSILLECTOMY    ? ? ?Current Medications, Allergies, Family History and Social History were reviewed in [employer] record. ?  ?   ?Current Outpatient Medications  ?Medication Sig Dispense Refill  ? amLODipine (NORVASC) 2.5 MG tablet Take 1 tablet (2.5 mg total) by mouth every evening. 90 tablet 1  ? Ascorbic Acid (VITAMIN C PO) Take 1 tablet by mouth daily in the afternoon.    ? aspirin EC 81 MG tablet Take 81 mg by mouth daily. Swallow whole.    ? buPROPion (WELLBUTRIN SR) 150 MG 12 hr tablet Take 150 mg by mouth daily.    ? cholecalciferol (VITAMIN D) 25 MCG (1000 UNIT) tablet Take 2,000 Units by mouth daily.    ? clonazePAM (KLONOPIN) 0.5 MG tablet Take 1 tablet (0.5 mg total) by mouth 2 (two) times daily as needed for anxiety. 60 tablet 1  ? Coenzyme Q10 (CO Q 10 PO) Take by mouth.    ? escitalopram (LEXAPRO) 20 MG tablet Take 1 tablet (20 mg total) by mouth daily. 90 tablet 1  ? Lidocaine, Anorectal, (RECTICARE EX) Apply 1 application. topically 2 (two) times daily.    ? losartan (COZAAR) 100 MG tablet Take 1 tablet (100 mg total) by mouth daily. 90 tablet 1  ? megestrol (MEGACE) 20 MG tablet Take 1 tablet (20 mg total) by mouth daily. 90 tablet 1  ? pantoprazole (PROTONIX) 40 MG tablet Take 1 tablet (40 mg total) by mouth daily. 90 tablet 1  ? rosuvastatin (CRESTOR) 20 MG tablet Take 1 tablet (20 mg total) by mouth daily. 90 tablet 1  ? ?No current facility-administered medications for this visit.  ? ? ?Review of Systems: ?No chest pain. No shortness of breath. No urinary complaints.  ? ? ?Physical Exam  ? ?Wt Readings from Last 3 Encounters:  ?12/16/21 122 lb 8 oz (55.6 kg)  ?12/01/21 122 lb (55.3 kg)  ?11/29/21 124 lb (56.2 kg)  ? ? ?BP 136/64   Pulse 84   Ht '5\' 4"'  (1.626 m)   Wt 122 lb 8 oz (55.6 kg)   BMI 21.03 kg/m?  ?Constitutional:  Generally well appearing female in no acute distress. ?Psychiatric: Pleasant. Normal mood and affect. Behavior is normal. ?EENT: Pupils normal.  Conjunctivae are normal. No scleral icterus. ?Neck supple.  ?Cardiovascular: Normal rate, regular rhythm. No edema ?Pulmonary/chest: Effort normal and  breath sounds normal. No wheezing, rales or rhonchi. ?Abdominal: Soft, nondistended, nontender. Bowel sounds active throughout. There are no masses palpable. No hepatomegaly. ?Rectal : Tiny external hemorrhoids. Fleshy external hemorrhoid tags  ?Neurological: Alert and  oriented to person place and time. ?Skin: Skin is warm and dry. No rashes noted. ? ?Tye Savoy, NP  12/16/2021, 2:19 PM ? ? ? ? ? ? ? ? ?

## 2021-12-16 NOTE — Patient Instructions (Addendum)
If you are age 86 or older, your body mass index should be between 23-30. Your Body mass index is 21.03 kg/m?Marland Kitchen If this is out of the aforementioned range listed, please consider follow up with your Primary Care Provider. ? ?If you are age 65 or younger, your body mass index should be between 19-25. Your Body mass index is 21.03 kg/m?Marland Kitchen If this is out of the aformentioned range listed, please consider follow up with your Primary Care Provider.  ? ?________________________________________________________ ? ?The [facility] providers would like to encourage you to use [facility] to communicate with providers for non-urgent requests or questions.  Due to long hold times on the telephone, sending your provider a message by [facility] may be a faster and more efficient way to get a response.  Please allow 48 business hours for a response.  Please remember that this is for non-urgent requests.  ?_______________________________________________________ ? ?We have sent the following medications to your pharmacy for you to pick up at your convenience: Anusol HC cream. - Use a small amount nightly for 10 nights. ? ? ?Continue daily fiber. If you start to get constipated again then add 1 capful of Miralax daily. After course of steroid cream for hemorrhoids please call our office and let us know if the rectal has stopped.  ?

## 2021-12-26 ENCOUNTER — Telehealth: Payer: Self-pay | Admitting: Nurse Practitioner

## 2021-12-26 NOTE — Telephone Encounter (Signed)
Inbound call from patient reports she is feeling much better with the medication. Is not experiencing anymore bleeding nor constipation.  ?

## 2022-01-05 ENCOUNTER — Ambulatory Visit: Payer: Medicare Other | Admitting: Adult Health

## 2022-01-19 ENCOUNTER — Other Ambulatory Visit: Payer: Self-pay | Admitting: Nurse Practitioner

## 2022-01-19 ENCOUNTER — Encounter: Payer: Self-pay | Admitting: Nurse Practitioner

## 2022-01-19 ENCOUNTER — Ambulatory Visit (INDEPENDENT_AMBULATORY_CARE_PROVIDER_SITE_OTHER): Payer: Medicare Other | Admitting: Nurse Practitioner

## 2022-01-19 VITALS — BP 123/77 | HR 78 | Temp 97.5°F | Resp 20 | Ht 64.0 in | Wt 123.0 lb

## 2022-01-19 DIAGNOSIS — F411 Generalized anxiety disorder: Secondary | ICD-10-CM | POA: Diagnosis not present

## 2022-01-19 DIAGNOSIS — I10 Essential (primary) hypertension: Secondary | ICD-10-CM

## 2022-01-19 DIAGNOSIS — R634 Abnormal weight loss: Secondary | ICD-10-CM

## 2022-01-19 DIAGNOSIS — F321 Major depressive disorder, single episode, moderate: Secondary | ICD-10-CM

## 2022-01-19 MED ORDER — CLONAZEPAM 0.5 MG PO TABS: 0.5000 mg | ORAL_TABLET | Freq: Two times a day (BID) | ORAL | 3 refills | Status: DC | PRN

## 2022-01-19 NOTE — Patient Instructions (Signed)

## 2022-01-19 NOTE — Progress Notes (Signed)
Subjective:    Patient ID: Christina Silva, female    DOB: Jun 18, 1933, 88 y.o.   MRN: [id number]   Chief Complaint: anxiey  Anxiety Patient reports no chest pain, dizziness, palpitations or shortness of breath.    Patient is in today for klonopin refill. She is under a lot of stress right now because her daughter is in the hospital waiting for a heart transplant. She is on klonopin bid. Really needs it right now.    01/19/2022   11:51 AM 06/29/2021   11:14 AM 06/14/2021    9:16 AM 02/09/2021    8:53 AM  GAD 7 : Generalized Anxiety Score  Nervous, Anxious, on Edge '1 3 1 1  '$ Control/stop worrying '1 3 1 1  '$ Worry too much - different things '1 3 1 1  '$ Trouble relaxing 0 3 1 0  Restless 0 2 0 0  Easily annoyed or irritable 0 1 0 0  Afraid - awful might happen 0 3 1 0  Total GAD 7 Score '3 18 5 3  '$ Anxiety Difficulty Not difficult at all Not difficult at all Not difficult at all Not difficult at all        Review of Systems  Constitutional:  Negative for diaphoresis.  Eyes:  Negative for pain.  Respiratory:  Negative for shortness of breath.   Cardiovascular:  Negative for chest pain, palpitations and leg swelling.  Gastrointestinal:  Negative for abdominal pain.  Endocrine: Negative for polydipsia.  Skin:  Negative for rash.  Neurological:  Negative for dizziness, weakness and headaches.  Hematological:  Does not bruise/bleed easily.  All other systems reviewed and are negative.     Objective:   Physical Exam Vitals and nursing note reviewed.  Constitutional:      General: She is not in acute distress.    Appearance: Normal appearance. She is well-developed.  Neck:     Vascular: No carotid bruit or JVD.  Cardiovascular:     Rate and Rhythm: Normal rate and regular rhythm.     Heart sounds: Normal heart sounds.  Pulmonary:     Effort: Pulmonary effort is normal. No respiratory distress.     Breath sounds: Normal breath sounds. No wheezing or rales.  Chest:      Chest wall: No tenderness.  Abdominal:     General: Bowel sounds are normal. There is no distension or abdominal bruit.     Palpations: Abdomen is soft. There is no hepatomegaly, splenomegaly, mass or pulsatile mass.     Tenderness: There is no abdominal tenderness.  Musculoskeletal:        General: Normal range of motion.     Cervical back: Normal range of motion and neck supple.  Lymphadenopathy:     Cervical: No cervical adenopathy.  Skin:    General: Skin is warm and dry.  Neurological:     Mental Status: She is alert and oriented to person, place, and time.     Deep Tendon Reflexes: Reflexes are normal and symmetric.  Psychiatric:        Behavior: Behavior normal.        Thought Content: Thought content normal.        Judgment: Judgment normal.    BP 123/77   Pulse 78   Temp (!) 97.5 F (36.4 C) (Temporal)   Resp 20   Ht '5\' 4"'$  (1.626 m)   Wt 123 lb (55.8 kg)   SpO2 95%   BMI 21.11 kg/m  Assessment & Plan:   Christina Silva in today with chief complaint of Anxiety   1. Anxiety state 2. GAD (generalized anxiety disorder) Stress management - clonazePAM (KLONOPIN) 0.5 MG tablet; Take 1 tablet (0.5 mg total) by mouth 2 (two) times daily as needed for anxiety.  Dispense: 60 tablet; Refill: 3    The above assessment and management plan was discussed with the patient. The patient verbalized understanding of and has agreed to the management plan. Patient is aware to call the clinic if symptoms persist or worsen. Patient is aware when to return to the clinic for a follow-up visit. Patient educated on when it is appropriate to go to the emergency department.   Mary-Margaret Hassell Done, FNP

## 2022-01-31 ENCOUNTER — Ambulatory Visit (INDEPENDENT_AMBULATORY_CARE_PROVIDER_SITE_OTHER): Payer: Medicare Other | Admitting: Family

## 2022-01-31 ENCOUNTER — Encounter: Payer: Self-pay | Admitting: Family

## 2022-01-31 VITALS — BP 127/60 | HR 73 | Temp 97.9°F | Wt 126.6 lb

## 2022-01-31 DIAGNOSIS — L239 Allergic contact dermatitis, unspecified cause: Secondary | ICD-10-CM

## 2022-01-31 DIAGNOSIS — R42 Dizziness and giddiness: Secondary | ICD-10-CM

## 2022-01-31 MED ORDER — TRIAMCINOLONE ACETONIDE 0.5 % EX OINT: 1.0000 "application " | TOPICAL_OINTMENT | Freq: Two times a day (BID) | CUTANEOUS | 1 refills | Status: DC

## 2022-01-31 NOTE — Patient Instructions (Signed)
Contact Dermatitis Dermatitis is redness, soreness, and swelling (inflammation) of the skin. Contact dermatitis is a reaction to certain substances that touch the skin. Many different substances can cause contact dermatitis. There are two types of contact dermatitis: Irritant contact dermatitis. This type is caused by something that irritates your skin, such as having dry hands from washing them too often with soap. This type does not require previous exposure to the substance for a reaction to occur. This is the most common type. Allergic contact dermatitis. This type is caused by a substance that you are allergic to, such as poison ivy. This type occurs when you have been exposed to the substance (allergen) and develop a sensitivity to it. Dermatitis may develop soon after your first exposure to the allergen, or it may not develop until the next time you are exposed and every time thereafter. What are the causes? Irritant contact dermatitis is most commonly caused by exposure to: Makeup. Soaps. Detergents. Bleaches. Acids. Metal salts, such as nickel. Allergic contact dermatitis is most commonly caused by exposure to: Poisonous plants. Chemicals. Jewelry. Latex. Medicines. Preservatives in products, such as clothing. What increases the risk? You are more likely to develop this condition if you have: A job that exposes you to irritants or allergens. Certain medical conditions, such as asthma or eczema. What are the signs or symptoms? Symptoms of this condition may occur on your body anywhere the irritant has touched you or is touched by you. Symptoms include: Dryness or flaking. Redness. Cracks. Itching. Pain or a burning feeling. Blisters. Drainage of small amounts of blood or clear fluid from skin cracks. With allergic contact dermatitis, there may also be swelling in areas such as the eyelids, mouth, or genitals. How is this diagnosed? This condition is diagnosed with a medical  history and physical exam. A patch skin test may be performed to help determine the cause. If the condition is related to your job, you may need to see an occupational medicine specialist. How is this treated? This condition is treated by checking for the cause of the reaction and protecting your skin from further contact. Treatment may also include: Steroid creams or ointments. Oral steroid medicines may be needed in more severe cases. Antibiotic medicines or antibacterial ointments, if a skin infection is present. Antihistamine lotion or an antihistamine taken by mouth to ease itching. A bandage (dressing). Follow these instructions at home: Skin care Moisturize your skin as needed. Apply cool compresses to the affected areas. Try applying baking soda paste to your skin. Stir water into baking soda until it reaches a paste-like consistency. Do not scratch your skin, and avoid friction to the affected area. Avoid the use of soaps, perfumes, and dyes. Medicines Take or apply over-the-counter and prescription medicines only as told by your health care provider. If you were prescribed an antibiotic medicine, take or apply the antibiotic as told by your health care provider. Do not stop using the antibiotic even if your condition improves. Bathing Try taking a bath with: Epsom salts. Follow the instructions on the packaging. You can get these at your local pharmacy or grocery store. Baking soda. Pour a small amount into the bath as directed by your health care provider. Colloidal oatmeal. Follow the instructions on the packaging. You can get this at your local pharmacy or grocery store. Bathe less frequently, such as every other day. Bathe in lukewarm water. Avoid using hot water. Bandage care If you were given a bandage (dressing), change it as told   by your health care provider. Wash your hands with soap and water before and after you change your dressing. If soap and water are not  available, use hand sanitizer. General instructions Avoid the substance that caused your reaction. If you do not know what caused it, keep a journal to try to track what caused it. Write down: What you eat. What cosmetic products you use. What you drink. What you wear in the affected area. This includes jewelry. Check the affected areas every day for signs of infection. Check for: More redness, swelling, or pain. More fluid or blood. Warmth. Pus or a bad smell. Keep all follow-up visits as told by your health care provider. This is important. Contact a health care provider if: Your condition does not improve with treatment. Your condition gets worse. You have signs of infection such as swelling, tenderness, redness, soreness, or warmth in the affected area. You have a fever. You have new symptoms. Get help right away if: You have a severe headache, neck pain, or neck stiffness. You vomit. You feel very sleepy. You notice red streaks coming from the affected area. Your bone or joint underneath the affected area becomes painful after the skin has healed. The affected area turns darker. You have difficulty breathing. Summary Dermatitis is redness, soreness, and swelling (inflammation) of the skin. Contact dermatitis is a reaction to certain substances that touch the skin. Symptoms of this condition may occur on your body anywhere the irritant has touched you or is touched by you. This condition is treated by figuring out what caused the reaction and protecting your skin from further contact. Treatment may also include medicines and skin care. Avoid the substance that caused your reaction. If you do not know what caused it, keep a journal to try to track what caused it. Contact a health care provider if your condition gets worse or you have signs of infection such as swelling, tenderness, redness, soreness, or warmth in the affected area. This information is not intended to replace  advice given to you by your health care provider. Make sure you discuss any questions you have with your health care provider. Document Revised: 05/30/2021 Document Reviewed: 05/30/2021 Elsevier Patient Education  2023 [employer]  

## 2022-01-31 NOTE — Progress Notes (Signed)
Subjective:    Patient ID: Christina Silva, female    DOB: 12-Feb-1933, 88 y.o.   MRN: [id number]  Chief Complaint  Patient presents with   Rash    On chest   Pt presents to the office today with a rash on her chest that started a few days ago. Reports it is improving.  Rash This is a new problem. The current episode started in the past 7 days. The problem has been gradually improving since onset. The affected locations include the chest. The rash is characterized by itchiness and redness. She was exposed to nothing. Pertinent negatives include no fatigue, shortness of breath, sore throat or vomiting. Past treatments include topical steroids. The treatment provided mild relief.  Dizziness This is a recurrent problem. The current episode started more than 1 month ago. The problem occurs intermittently. Associated symptoms include a rash. Pertinent negatives include no fatigue, sore throat or vomiting.     Review of Systems  Constitutional:  Negative for fatigue.  HENT:  Negative for sore throat.   Respiratory:  Negative for shortness of breath.   Gastrointestinal:  Negative for vomiting.  Skin:  Positive for rash.  Neurological:  Positive for dizziness.  All other systems reviewed and are negative.     Objective:   Physical Exam Vitals reviewed.  Constitutional:      General: She is not in acute distress.    Appearance: She is well-developed.  HENT:     Head: Normocephalic and atraumatic.     Right Ear: Tympanic membrane normal.     Left Ear: Tympanic membrane normal.  Eyes:     Pupils: Pupils are equal, round, and reactive to light.  Neck:     Thyroid: No thyromegaly.  Cardiovascular:     Rate and Rhythm: Normal rate and regular rhythm.     Heart sounds: Normal heart sounds. No murmur heard. Pulmonary:     Effort: Pulmonary effort is normal. No respiratory distress.     Breath sounds: Normal breath sounds. No wheezing.  Abdominal:     General: Bowel sounds are  normal. There is no distension.     Palpations: Abdomen is soft.     Tenderness: There is no abdominal tenderness.  Musculoskeletal:        General: No tenderness. Normal range of motion.     Cervical back: Normal range of motion and neck supple.  Skin:    General: Skin is warm and dry.     Findings: Rash present.          Comments: Erythemas rash on chest  Neurological:     Mental Status: She is alert and oriented to person, place, and time.     Cranial Nerves: No cranial nerve deficit.     Deep Tendon Reflexes: Reflexes are normal and symmetric.  Psychiatric:        Behavior: Behavior normal.        Thought Content: Thought content normal.        Judgment: Judgment normal.    BP 127/60   Pulse 73   Temp 97.9 F (36.6 C)   Wt 126 lb 9.6 oz (57.4 kg)   SpO2 96%   BMI 21.73 kg/m       Assessment & Plan:  Christina Silva comes in today with chief complaint of Rash (On chest)   Diagnosis and orders addressed:  1. Allergic contact dermatitis, unspecified trigger Start Kenalog BID  Avoid scratching  - triamcinolone ointment (KENALOG)  0.5 %; Apply 1 application. topically 2 (two) times daily.  Dispense: 60 g; Refill: 1  2. Dizziness Dicussed decreasing Klonopin. Pt states she has been taking it more lately and will decrease to daily.  Fall precautions discussed     Health Maintenance reviewed Diet and exercise encouraged  Follow up plan: Keep follow up with PCP   Evelina Dun, FNP

## 2022-04-11 DIAGNOSIS — H532 Diplopia: Secondary | ICD-10-CM | POA: Diagnosis not present

## 2022-04-21 ENCOUNTER — Encounter: Payer: Self-pay | Admitting: Nurse Practitioner

## 2022-04-21 ENCOUNTER — Ambulatory Visit (INDEPENDENT_AMBULATORY_CARE_PROVIDER_SITE_OTHER): Payer: Medicare Other | Admitting: Nurse Practitioner

## 2022-04-21 VITALS — BP 109/61 | HR 74 | Temp 97.4°F | Resp 20 | Ht 64.0 in | Wt 127.0 lb

## 2022-04-21 DIAGNOSIS — G43919 Migraine, unspecified, intractable, without status migrainosus: Secondary | ICD-10-CM

## 2022-04-21 DIAGNOSIS — G459 Transient cerebral ischemic attack, unspecified: Secondary | ICD-10-CM | POA: Diagnosis not present

## 2022-04-21 DIAGNOSIS — I6930 Unspecified sequelae of cerebral infarction: Secondary | ICD-10-CM

## 2022-04-21 DIAGNOSIS — I10 Essential (primary) hypertension: Secondary | ICD-10-CM | POA: Diagnosis not present

## 2022-04-21 DIAGNOSIS — R739 Hyperglycemia, unspecified: Secondary | ICD-10-CM | POA: Diagnosis not present

## 2022-04-21 DIAGNOSIS — F411 Generalized anxiety disorder: Secondary | ICD-10-CM

## 2022-04-21 DIAGNOSIS — E785 Hyperlipidemia, unspecified: Secondary | ICD-10-CM

## 2022-04-21 DIAGNOSIS — M8588 Other specified disorders of bone density and structure, other site: Secondary | ICD-10-CM | POA: Diagnosis not present

## 2022-04-21 DIAGNOSIS — K219 Gastro-esophageal reflux disease without esophagitis: Secondary | ICD-10-CM | POA: Diagnosis not present

## 2022-04-21 DIAGNOSIS — F321 Major depressive disorder, single episode, moderate: Secondary | ICD-10-CM

## 2022-04-21 DIAGNOSIS — Z23 Encounter for immunization: Secondary | ICD-10-CM

## 2022-04-21 DIAGNOSIS — R634 Abnormal weight loss: Secondary | ICD-10-CM | POA: Diagnosis not present

## 2022-04-21 MED ORDER — AMLODIPINE BESYLATE 2.5 MG PO TABS: 2.5000 mg | ORAL_TABLET | Freq: Every evening | ORAL | 1 refills | Status: DC

## 2022-04-21 MED ORDER — MEGESTROL ACETATE 20 MG PO TABS: ORAL_TABLET | ORAL | 1 refills | Status: DC

## 2022-04-21 MED ORDER — ROSUVASTATIN CALCIUM 20 MG PO TABS: 20.0000 mg | ORAL_TABLET | Freq: Every day | ORAL | 1 refills | Status: DC

## 2022-04-21 MED ORDER — PANTOPRAZOLE SODIUM 40 MG PO TBEC: 40.0000 mg | DELAYED_RELEASE_TABLET | Freq: Every day | ORAL | 1 refills | Status: DC

## 2022-04-21 MED ORDER — ESCITALOPRAM OXALATE 20 MG PO TABS: 20.0000 mg | ORAL_TABLET | Freq: Every day | ORAL | 1 refills | Status: DC

## 2022-04-21 NOTE — Patient Instructions (Signed)
Transient Ischemic Attack A transient ischemic attack (TIA) causes stroke-like symptoms that go away quickly. Having a TIA means that a person is at higher risk for a stroke. A TIA happens when blood supply to the brain is blocked temporarily. A TIA is a medical emergency. What are the causes? This condition is caused by a temporary blockage in an artery in the head or neck. This means the brain does not get the blood supply it needs. There is no permanent brain damage with a TIA. A blockage can be caused by: Fatty buildup in an artery in the head or neck (atherosclerosis). A blood clot. An artery tear (dissection). Inflammation of an artery (vasculitis). Sometimes the cause is not known. What increases the risk? Certain factors may make you more likely to develop this condition. Some of these are things that you can change, such as: Obesity. Using products that contain nicotine or tobacco. Taking oral birth control, especially if you also use tobacco. Not being active. Heavy alcohol use. Drug use, especially cocaine and methamphetamine. Medical conditions that may increase your risk include: High blood pressure (hypertension). High cholesterol. Diabetes. Heart disease (coronary artery disease). An irregular heartbeat, also called atrial fibrillation (AFib). Sickle cell disease. Sleep problems (sleep apnea). Chronic inflammatory diseases, such as rheumatoid arthritis or lupus. Blood clotting disorders (hypercoagulable state). Other risk factors include: Being over the age of 108. Being female. Family history of stroke. Previous history of blood clots, stroke, TIA, or heart attack. Having a history of preeclampsia. Migraine headache. What are the signs or symptoms? Symptoms of a TIA are the same as those of a stroke. The symptoms develop suddenly, and then go away quickly. They may include: Weakness or numbness in your face, arm, or leg, especially on one side of your body. Trouble  walking or moving your arms or legs. Trouble speaking, understanding speech, or both (aphasia). Vision changes, such as double vision, blurred vision, or loss of vision. Dizziness. Confusion. Loss of balance or coordination. Nausea and vomiting. Severe headache. If possible, note what time your symptoms started. Tell your health care provider. How is this diagnosed? This condition may be diagnosed based on: Your symptoms and medical history. A physical exam. Imaging tests, usually a CT scan or MRI of the brain. Blood tests. You may also have other tests, including: Electrocardiogram (ECG). Echocardiogram. Carotid ultrasound. A scan of blood circulation in the brain (CT angiogram or MR angiogram). Continuous heart monitoring. How is this treated? The goal of treatment is to reduce the risk for a stroke. Stroke prevention therapies may include: Changes to diet and lifestyle, such as being physically active and stopping smoking. Medicines to thin the blood (antiplatelets or anticoagulants). Blood pressure medicines. Medicines to reduce cholesterol. Treating other health conditions, such as diabetes or AFib. If testing shows a narrowing in the arteries to your brain, your health care provider may recommend a procedure, such as: Carotid endarterectomy. This is done to remove the blockage from your artery. Carotid angioplasty and stenting. This uses a tube (stent) to open or widen an artery in the neck. The stent helps keep the artery open by supporting the artery walls. Follow these instructions at home: Medicines Take over-the-counter and prescription medicines only as told by your health care provider. If you were told to take a medicine to thin your blood, such as aspirin or an anticoagulant, take it exactly as told by your health care provider. Taking too much blood-thinning medicine can cause bleeding. Taking too little will  not protect you against a stroke and other  problems. Eating and drinking  Eat 5 or more servings of fruits and vegetables each day. Follow guidelines from your health care provider about your diet. You may need to follow a certain diet to help manage risk factors for stroke. This may include: Eating a low-fat, low-salt diet. Choosing high-fiber foods. Limiting carbohydrates and sugar. If you drink alcohol: Limit how much you have to: 0-1 drink a day for women who are not pregnant. 0-2 drinks a day for men. Know how much alcohol is in a drink. In the [place] one drink equals one 12 oz bottle of beer (348m), one 5 oz glass of wine (1472m, or one 1 oz glass of hard liquor (4439m General instructions Maintain a healthy weight. Try to get at least 30 minutes of exercise on most days. Get treatment if you have sleep apnea. Do not use any products that contain nicotine or tobacco. These products include cigarettes, chewing tobacco, and vaping devices, such as e-cigarettes. If you need help quitting, ask your health care provider. Do not use drugs. Keep all follow-up visits. This is important. Where to find more information American Stroke Association: www.stroke.org Get help right away if: You have chest pain or an irregular heartbeat. You have any symptoms of a stroke. "BE FAST" is an easy way to remember the main warning signs of a stroke. B - Balance. Signs are dizziness, sudden trouble walking, or loss of balance. E - Eyes. Signs are trouble seeing or a sudden change in vision. F - Face. Signs are sudden weakness or numbness of the face, or the face or eyelid drooping on one side. A - Arms. Signs are weakness or numbness in an arm. This happens suddenly and usually on one side of the body. S - Speech. Signs are sudden trouble speaking, slurred speech, or trouble understanding what people say. T - Time. Time to call emergency services. Write down what time symptoms started. You have other signs of a stroke, such as: A sudden,  severe headache with no known cause. Nausea or vomiting. Seizure. These symptoms may represent a serious problem that is an emergency. Do not wait to see if the symptoms will go away. Get medical help right away. Call your local emergency services (911 in the [place] Do not drive yourself to the hospital. Summary A transient ischemic attack (TIA) happens when an artery in the head or neck is blocked. The blockage clears before there is any permanent brain damage. A TIA is a medical emergency. Symptoms of a TIA are the same as those of a stroke. The symptoms develop suddenly, and then go away quickly. Having a TIA means that you are at higher risk for a stroke. The goal of treatment is to reduce your risk for a stroke. Treatment may include medicines to thin the blood and changes to diet and lifestyle. This information is not intended to replace advice given to you by your health care provider. Make sure you discuss any questions you have with your health care provider. Document Revised: 12/14/2021 Document Reviewed: 03/09/2020 Elsevier Patient Education  [id number]

## 2022-04-21 NOTE — Progress Notes (Signed)
Subjective:    Patient ID: Christina Silva, female    DOB: 01-Sep-1932, 88 y.o.   MRN: [id number]   Chief Complaint: medical management of chronic issues     HPI:  Christina Silva is a 88 y.o. who identifies as a female who was assigned female at birth.   Social history: Lives with: her daughter lives with her Work history: retired   Scientist, forensic in today for follow up of the following chronic medical issues:  1. Primary hypertension No c/o chest pain, sob or headache. Does not check blood pressure at home very often. BP Readings from Last 3 Encounters:  04/21/22 109/61  01/31/22 127/60  01/19/22 123/77     2. Hyperlipidemia with target LDL less than 100 Does watch diet and stays very active. Lab Results  Component Value Date   CHOL 140 10/04/2021   HDL 48 10/04/2021   LDLCALC 72 10/04/2021   TRIG 112 10/04/2021   CHOLHDL 2.9 10/04/2021     3. Hyperglycemia Doe snot check blood sugars at home. Lab Results  Component Value Date   HGBA1C 5.6 02/09/2021     4. Refractory migraine with aura Has not been having any migraines  5. Late effect of cerebrovascular accident (CVA) No permanent effects. She has been seeing neurology- has had some TIA's in the last several months.  6. Depression, major, single episode, moderate (HCC) Is on lexapro daily and is doing well.    04/21/2022   11:44 AM 01/31/2022   12:12 PM 01/19/2022   11:50 AM  Depression screen PHQ 2/9  Decreased Interest 0 0 0  Down, Depressed, Hopeless 0 1 0  PHQ - 2 Score 0 1 0  Altered sleeping 0 0 0  Tired, decreased energy 1 0 2  Change in appetite 0 0 1  Feeling bad or failure about yourself  0 0 0  Trouble concentrating '1 1 1  ' Moving slowly or fidgety/restless 0 1 0  Suicidal thoughts 0 0 0  PHQ-9 Score '2 3 4  ' Difficult doing work/chores Not difficult at all Not difficult at all Not difficult at all     7. Anxiety state Lexapro helps    04/21/2022   11:45 AM 01/31/2022   12:12 PM  01/19/2022   11:51 AM 06/29/2021   11:14 AM  GAD 7 : Generalized Anxiety Score  Nervous, Anxious, on Edge 0 '1 1 3  ' Control/stop worrying 0 '1 1 3  ' Worry too much - different things '1 1 1 3  ' Trouble relaxing 0 0 0 3  Restless 0 0 0 2  Easily annoyed or irritable 0 0 0 1  Afraid - awful might happen 0 0 0 3  Total GAD 7 Score '1 3 3 18  ' Anxiety Difficulty Not difficult at all Not difficult at all Not difficult at all Not difficult at all      8. Osteopenia of spine Last dexascan was in 2016. No longer doing those.   New complaints: None today  Allergies  Allergen Reactions   Codeine Nausea And Vomiting   Lisinopril Swelling   Morphine Nausea And Vomiting   Other    Sulfa Antibiotics Nausea Only and Rash   Sulfonamide Derivatives Nausea Only and Rash   Outpatient Encounter Medications as of 04/21/2022  Medication Sig   amLODipine (NORVASC) 2.5 MG tablet Take 1 tablet (2.5 mg total) by mouth every evening.   aspirin EC 81 MG tablet Take 81 mg by mouth daily. Swallow whole.  buPROPion (WELLBUTRIN SR) 150 MG 12 hr tablet Take 150 mg by mouth daily.   cholecalciferol (VITAMIN D) 25 MCG (1000 UNIT) tablet Take 2,000 Units by mouth daily.   clonazePAM (KLONOPIN) 0.5 MG tablet Take 1 tablet (0.5 mg total) by mouth 2 (two) times daily as needed for anxiety.   Coenzyme Q10 (CO Q 10 PO) Take by mouth.   escitalopram (LEXAPRO) 20 MG tablet Take 1 tablet (20 mg total) by mouth daily.   hydrocortisone (ANUSOL-HC) 2.5 % rectal cream Place 1 application. rectally at bedtime.   losartan (COZAAR) 100 MG tablet TAKE ONE (1) TABLET BY MOUTH EVERY DAY   megestrol (MEGACE) 20 MG tablet TAKE ONE (1) TABLET BY MOUTH EVERY DAY   pantoprazole (PROTONIX) 40 MG tablet Take 1 tablet (40 mg total) by mouth daily.   rosuvastatin (CRESTOR) 20 MG tablet Take 1 tablet (20 mg total) by mouth daily.   triamcinolone ointment (KENALOG) 0.5 % Apply 1 application. topically 2 (two) times daily.   No  facility-administered encounter medications on file as of 04/21/2022.    Past Surgical History:  Procedure Laterality Date   ABDOMINAL HYSTERECTOMY  1980   CATARACT EXTRACTION Right    CHOLECYSTECTOMY N/A 12/16/2019   Procedure: LAPAROSCOPIC CHOLECYSTECTOMY WITH INTRAOPERATIVE CHOLANGIOGRAM;  Surgeon: Erroll Luna, MD;  Location: [facility];  Service: General;  Laterality: N/A;   EYE SURGERY     implantable loop recorder placement  11/12/2020    Medtronic Reveal Linq model [id number] implantable loop recorder (SN [id number]) implanted by Dr Rayann Heman for cryptogenic stroke   KNEE ARTHROSCOPY Left    ROTATOR CUFF REPAIR Left    SQUAMOUS CELL CARCINOMA EXCISION Left    TONSILLECTOMY      Family History  Problem Relation Age of Onset   Heart attack Mother    Hyperlipidemia Mother    Heart disease Mother    Transient ischemic attack Father    Stroke Father    Psychiatric Illness Sister    Heart disease Sister    Arthritis Brother    Epilepsy Brother    Colon cancer Neg Hx    Esophageal cancer Neg Hx    Rectal cancer Neg Hx    Stomach cancer Neg Hx    Pancreatic cancer Neg Hx       Controlled substance contract: n/a     Review of Systems  Constitutional:  Negative for diaphoresis.  Eyes:  Negative for pain.  Respiratory:  Negative for shortness of breath.   Cardiovascular:  Negative for chest pain, palpitations and leg swelling.  Gastrointestinal:  Negative for abdominal pain.  Endocrine: Negative for polydipsia.  Skin:  Negative for rash.  Neurological:  Negative for dizziness, weakness and headaches.  Hematological:  Does not bruise/bleed easily.  All other systems reviewed and are negative.      Objective:   Physical Exam Vitals and nursing note reviewed.  Constitutional:      General: She is not in acute distress.    Appearance: Normal appearance. She is well-developed.  HENT:     Head: Normocephalic.     Right Ear: Tympanic membrane normal.     Left Ear: Tympanic  membrane normal.     Nose: Nose normal.     Mouth/Throat:     Mouth: Mucous membranes are moist.  Eyes:     Pupils: Pupils are equal, round, and reactive to light.  Neck:     Vascular: No carotid bruit or JVD.  Cardiovascular:     Rate  and Rhythm: Normal rate and regular rhythm.     Heart sounds: Normal heart sounds.  Pulmonary:     Effort: Pulmonary effort is normal. No respiratory distress.     Breath sounds: Normal breath sounds. No wheezing or rales.  Chest:     Chest wall: No tenderness.  Abdominal:     General: Bowel sounds are normal. There is no distension or abdominal bruit.     Palpations: Abdomen is soft. There is no hepatomegaly, splenomegaly, mass or pulsatile mass.     Tenderness: There is no abdominal tenderness.  Musculoskeletal:        General: Normal range of motion.     Cervical back: Normal range of motion and neck supple.  Lymphadenopathy:     Cervical: No cervical adenopathy.  Skin:    General: Skin is warm and dry.  Neurological:     Mental Status: She is alert and oriented to person, place, and time.     Cranial Nerves: No cranial nerve deficit.     Sensory: No sensory deficit.     Deep Tendon Reflexes: Reflexes are normal and symmetric.  Psychiatric:        Behavior: Behavior normal.        Thought Content: Thought content normal.        Judgment: Judgment normal.    BP 109/61   Pulse 74   Temp (!) 97.4 F (36.3 C) (Temporal)   Resp 20   Ht '5\' 4"'  (1.626 m)   Wt 127 lb (57.6 kg)   SpO2 96%   BMI 21.80 kg/m          Assessment & Plan:  Christina Silva comes in today with chief complaint of Medical Management of Chronic Issues   Diagnosis and orders addressed:  1. Primary hypertension Low sodium diet - amLODipine (NORVASC) 2.5 MG tablet; Take 1 tablet (2.5 mg total) by mouth every evening.  Dispense: 90 tablet; Refill: 1 - CBC with Differential/Platelet - CMP14+EGFR  2. Hyperlipidemia with target LDL less than 100 Low fat  diet - rosuvastatin (CRESTOR) 20 MG tablet; Take 1 tablet (20 mg total) by mouth daily.  Dispense: 90 tablet; Refill: 1 - Lipid panel  3. Hyperglycemia Watch carbs in diet  4. Refractory migraine with aura Avoid caffeine  5. Late effect of cerebrovascular accident (CVA) Make appointment with neurology  6. Depression, major, single episode, moderate (HCC) Stress management - escitalopram (LEXAPRO) 20 MG tablet; Take 1 tablet (20 mg total) by mouth daily.  Dispense: 90 tablet; Refill: 1  7. Anxiety state  8. Osteopenia of spine Weight bearing exercises  9. Gastroesophageal reflux disease without esophagitis Avoid spicy foods Do not eat 2 hours prior to bedtime - pantoprazole (PROTONIX) 40 MG tablet; Take 1 tablet (40 mg total) by mouth daily.  Dispense: 90 tablet; Refill: 1  10. Weight loss - megestrol (MEGACE) 20 MG tablet; TAKE ONE (1) TABLET BY MOUTH EVERY DAY  Dispense: 90 tablet; Refill: 1  11. TIA (transient ischemic attack) - MR Brain W Wo Contrast; Future   Labs pending Health Maintenance reviewed Diet and exercise encouraged  Follow up plan: 6 months   [place], FNP

## 2022-04-21 NOTE — Addendum Note (Signed)
Addended by: Rolena Infante on: 04/21/2022 04:58 PM   Modules accepted: Orders

## 2022-04-22 LAB — CMP14+EGFR
ALT: 17 IU/L (ref 0–32)
AST: 19 IU/L (ref 0–40)
Albumin/Globulin Ratio: 2.1 (ref 1.2–2.2)
Albumin: 4.5 g/dL (ref 3.7–4.7)
Alkaline Phosphatase: 62 IU/L (ref 44–121)
BUN/Creatinine Ratio: 16 (ref 12–28)
BUN: 13 mg/dL (ref 8–27)
Bilirubin Total: 0.6 mg/dL (ref 0.0–1.2)
CO2: 23 mmol/L (ref 20–29)
Calcium: 10.2 mg/dL (ref 8.7–10.3)
Chloride: 104 mmol/L (ref 96–106)
Creatinine, Ser: 0.81 mg/dL (ref 0.57–1.00)
Globulin, Total: 2.1 g/dL (ref 1.5–4.5)
Glucose: 87 mg/dL (ref 70–99)
Potassium: 4.3 mmol/L (ref 3.5–5.2)
Sodium: 142 mmol/L (ref 134–144)
Total Protein: 6.6 g/dL (ref 6.0–8.5)
eGFR: 70 mL/min/{1.73_m2} (ref >59)

## 2022-04-22 LAB — CBC WITH DIFFERENTIAL/PLATELET
Basophils Absolute: 0.1 10*3/uL (ref 0.0–0.2)
Basos: 1 %
EOS (ABSOLUTE): 0.2 10*3/uL (ref 0.0–0.4)
Eos: 2 %
Hematocrit: 44.6 % (ref 34.0–46.6)
Hemoglobin: 15 g/dL (ref 11.1–15.9)
Immature Grans (Abs): 0 10*3/uL (ref 0.0–0.1)
Immature Granulocytes: 0 %
Lymphocytes Absolute: 2.1 10*3/uL (ref 0.7–3.1)
Lymphs: 29 %
MCH: 31.1 pg (ref 26.6–33.0)
MCHC: 33.6 g/dL (ref 31.5–35.7)
MCV: 92 fL (ref 79–97)
Monocytes Absolute: 0.7 10*3/uL (ref 0.1–0.9)
Monocytes: 9 %
Neutrophils Absolute: 4.3 10*3/uL (ref 1.4–7.0)
Neutrophils: 59 %
Platelets: 285 10*3/uL (ref 150–450)
RBC: 4.83 x10E6/uL (ref 3.77–5.28)
RDW: 12 % (ref 11.7–15.4)
WBC: 7.3 10*3/uL (ref 3.4–10.8)

## 2022-04-22 LAB — LIPID PANEL
Chol/HDL Ratio: 2.6 ratio (ref 0.0–4.4)
Cholesterol, Total: 153 mg/dL (ref 100–199)
HDL: 58 mg/dL (ref >39)
LDL Chol Calc (NIH): 77 mg/dL (ref 0–99)
Triglycerides: 95 mg/dL (ref 0–149)
VLDL Cholesterol Cal: 18 mg/dL (ref 5–40)

## 2022-04-23 ENCOUNTER — Other Ambulatory Visit: Payer: Self-pay | Admitting: Nurse Practitioner

## 2022-04-24 MED ORDER — LOSARTAN POTASSIUM 100 MG PO TABS: ORAL_TABLET | ORAL | 1 refills | Status: DC

## 2022-04-24 NOTE — Addendum Note (Signed)
Addended by: [name] on: 04/24/2022 10:29 AM   Modules accepted: Orders

## 2022-05-13 ENCOUNTER — Ambulatory Visit (HOSPITAL_COMMUNITY)
Admission: RE | Admit: 2022-05-13 | Discharge: 2022-05-13 | Disposition: A | Discharge status: Home / Self Care | Payer: Medicare Other | Source: Ambulatory Visit | Attending: Nurse Practitioner | Admitting: Nurse Practitioner

## 2022-05-13 DIAGNOSIS — G9389 Other specified disorders of brain: Secondary | ICD-10-CM | POA: Diagnosis not present

## 2022-05-13 DIAGNOSIS — I6381 Other cerebral infarction due to occlusion or stenosis of small artery: Secondary | ICD-10-CM | POA: Diagnosis not present

## 2022-05-13 DIAGNOSIS — G459 Transient cerebral ischemic attack, unspecified: Secondary | ICD-10-CM | POA: Diagnosis not present

## 2022-05-13 MED ORDER — GADOBUTROL 1 MMOL/ML IV SOLN
7.0000 mL | Freq: Once | INTRAVENOUS | Status: AC | PRN
Start: 2022-05-13 — End: 2022-05-13
  Administered 2022-05-13: 6 mL via INTRAVENOUS

## 2022-05-16 ENCOUNTER — Telehealth: Payer: Self-pay | Admitting: Nurse Practitioner

## 2022-05-16 NOTE — Telephone Encounter (Signed)
PATIENT AWARE OF RESULT

## 2022-06-08 ENCOUNTER — Encounter: Payer: Self-pay | Admitting: Family

## 2022-06-08 ENCOUNTER — Ambulatory Visit (INDEPENDENT_AMBULATORY_CARE_PROVIDER_SITE_OTHER): Payer: Medicare Other | Admitting: Family

## 2022-06-08 VITALS — BP 138/70 | HR 84 | Temp 97.4°F | Ht 64.0 in | Wt 127.0 lb

## 2022-06-08 DIAGNOSIS — F411 Generalized anxiety disorder: Secondary | ICD-10-CM

## 2022-06-08 DIAGNOSIS — I10 Essential (primary) hypertension: Secondary | ICD-10-CM | POA: Diagnosis not present

## 2022-06-08 DIAGNOSIS — R079 Chest pain, unspecified: Secondary | ICD-10-CM | POA: Diagnosis not present

## 2022-06-08 DIAGNOSIS — F410 Panic disorder [episodic paroxysmal anxiety] without agoraphobia: Secondary | ICD-10-CM

## 2022-06-08 LAB — CBC WITH DIFFERENTIAL/PLATELET
Basophils Absolute: 0 10*3/uL (ref 0.0–0.2)
Basos: 1 %
EOS (ABSOLUTE): 0.3 10*3/uL (ref 0.0–0.4)
Eos: 4 %
Hematocrit: 43.2 % (ref 34.0–46.6)
Hemoglobin: 15 g/dL (ref 11.1–15.9)
Immature Grans (Abs): 0 10*3/uL (ref 0.0–0.1)
Immature Granulocytes: 0 %
Lymphocytes Absolute: 1.7 10*3/uL (ref 0.7–3.1)
Lymphs: 25 %
MCH: 31.4 pg (ref 26.6–33.0)
MCHC: 34.7 g/dL (ref 31.5–35.7)
MCV: 91 fL (ref 79–97)
Monocytes Absolute: 0.6 10*3/uL (ref 0.1–0.9)
Monocytes: 9 %
Neutrophils Absolute: 4.2 10*3/uL (ref 1.4–7.0)
Neutrophils: 61 %
Platelets: 279 10*3/uL (ref 150–450)
RBC: 4.77 x10E6/uL (ref 3.77–5.28)
RDW: 11.8 % (ref 11.7–15.4)
WBC: 6.7 10*3/uL (ref 3.4–10.8)

## 2022-06-08 LAB — CMP14+EGFR
ALT: 14 IU/L (ref 0–32)
AST: 20 IU/L (ref 0–40)
Albumin/Globulin Ratio: 2.1 (ref 1.2–2.2)
Albumin: 4.5 g/dL (ref 3.7–4.7)
Alkaline Phosphatase: 64 IU/L (ref 44–121)
BUN/Creatinine Ratio: 23 (ref 12–28)
BUN: 15 mg/dL (ref 8–27)
Bilirubin Total: 0.6 mg/dL (ref 0.0–1.2)
CO2: 21 mmol/L (ref 20–29)
Calcium: 10 mg/dL (ref 8.7–10.3)
Chloride: 106 mmol/L (ref 96–106)
Creatinine, Ser: 0.64 mg/dL (ref 0.57–1.00)
Globulin, Total: 2.1 g/dL (ref 1.5–4.5)
Glucose: 93 mg/dL (ref 70–99)
Potassium: 3.8 mmol/L (ref 3.5–5.2)
Sodium: 141 mmol/L (ref 134–144)
Total Protein: 6.6 g/dL (ref 6.0–8.5)
eGFR: 85 mL/min/{1.73_m2} (ref >59)

## 2022-06-08 MED ORDER — BUSPIRONE HCL 5 MG PO TABS: 5.0000 mg | ORAL_TABLET | Freq: Two times a day (BID) | ORAL | 1 refills | Status: DC | PRN

## 2022-06-08 NOTE — Progress Notes (Signed)
Subjective:    Patient ID: Christina Silva, female    DOB: [birth year], 88 y.o.   MRN: [id number]  Chief Complaint  Patient presents with   Hypertension    199/95 called EMS and they wanted her to get checked out has [place] with her they did. Double vision on the wat here. Has a lot of stress going on at home. Had a tia Monday has hx of them    PT presents to the office today with chest tightness that started last night. Reports it has resolved now. She called EMS yesterday and had a stable EKG and was told to follow up with her PCP.   She reports she has increased GAD. States her grandson shot herself last week and is currently in the hospital. Her BP was elevated when she had chest pain. Her BP is normal today.   She saw a Cardiologists and wore a loop monitor for a year and was cleared.  Hypertension This is a chronic problem. The current episode started more than 1 year ago. The problem has been resolved since onset. The problem is controlled. Associated symptoms include anxiety, chest pain and malaise/fatigue. Pertinent negatives include no peripheral edema or shortness of breath. Risk factors for coronary artery disease include sedentary lifestyle. The current treatment provides moderate improvement.  Anxiety Presents for follow-up visit. Symptoms include chest pain, depressed mood, excessive worry, irritability, nervous/anxious behavior, panic and restlessness. Patient reports no shortness of breath. Symptoms occur occasionally. The severity of symptoms is moderate.    Chest Pain  This is a new problem. The current episode started yesterday. The onset quality is gradual. The problem has been resolved. The patient is experiencing no pain. Associated symptoms include malaise/fatigue. Pertinent negatives include no shortness of breath. The treatment provided mild relief.  Her past medical history is significant for hypertension.      Review of Systems  Constitutional:  Positive for  irritability and malaise/fatigue.  Respiratory:  Negative for shortness of breath.   Cardiovascular:  Positive for chest pain.  Psychiatric/Behavioral:  The patient is nervous/anxious.   All other systems reviewed and are negative.      Objective:   Physical Exam Vitals reviewed.  Constitutional:      General: She is not in acute distress.    Appearance: She is well-developed.  HENT:     Head: Normocephalic and atraumatic.     Right Ear: Tympanic membrane normal.     Left Ear: Tympanic membrane normal.  Eyes:     Pupils: Pupils are equal, round, and reactive to light.  Neck:     Thyroid: No thyromegaly.  Cardiovascular:     Rate and Rhythm: Normal rate and regular rhythm.     Heart sounds: Normal heart sounds. No murmur heard. Pulmonary:     Effort: Pulmonary effort is normal. No respiratory distress.     Breath sounds: Normal breath sounds. No wheezing.  Abdominal:     General: Bowel sounds are normal. There is no distension.     Palpations: Abdomen is soft.     Tenderness: There is no abdominal tenderness.  Musculoskeletal:        General: No tenderness. Normal range of motion.     Cervical back: Normal range of motion and neck supple.  Skin:    General: Skin is warm and dry.  Neurological:     Mental Status: She is alert and oriented to person, place, and time.     Cranial Nerves: No  cranial nerve deficit.     Deep Tendon Reflexes: Reflexes are normal and symmetric.  Psychiatric:        Behavior: Behavior normal.        Thought Content: Thought content normal.        Judgment: Judgment normal.       BP 138/70   Pulse 84   Temp (!) 97.4 F (36.3 C) (Temporal)   Ht '5\' 4"'  (1.626 m)   Wt 127 lb (57.6 kg)   BMI 21.80 kg/m      Assessment & Plan:  Christina Silva comes in today with chief complaint of Hypertension (199/95 called EMS and they wanted her to get checked out has [place] with her they did. Double vision on the wat here. Has a lot of stress going on  at home. Had a tia Monday has hx of them )   Diagnosis and orders addressed:  1. GAD (generalized anxiety disorder) Will start Buspar 5 mg BID prn Continue Lexapro 20 mg daily Stress management  - busPIRone (BUSPAR) 5 MG tablet; Take 1 tablet (5 mg total) by mouth 2 (two) times daily as needed.  Dispense: 60 tablet; Refill: 1 - CMP14+EGFR - CBC with Differential/Platelet  2. Chest pain, unspecified type  - EKG 12-Lead - CMP14+EGFR - CBC with Differential/Platelet  3. Primary hypertension - CMP14+EGFR - CBC with Differential/Platelet  4. Panic attack - busPIRone (BUSPAR) 5 MG tablet; Take 1 tablet (5 mg total) by mouth 2 (two) times daily as needed.  Dispense: 60 tablet; Refill: 1 - CMP14+EGFR - CBC with Differential/Platelet   Labs pending Health Maintenance reviewed Diet and exercise encouraged  Follow up plan: Keep follow up with PCP   Evelina Dun, FNP

## 2022-06-08 NOTE — Patient Instructions (Signed)

## 2022-07-10 ENCOUNTER — Ambulatory Visit (INDEPENDENT_AMBULATORY_CARE_PROVIDER_SITE_OTHER): Payer: Medicare Other

## 2022-07-10 VITALS — Ht 64.0 in | Wt 134.0 lb

## 2022-07-10 DIAGNOSIS — Z0000 Encounter for general adult medical examination without abnormal findings: Secondary | ICD-10-CM

## 2022-07-10 NOTE — Patient Instructions (Signed)
[name] , Thank you for taking time to come for your Medicare Wellness Visit. I appreciate your ongoing commitment to your health goals. Please review the following plan we discussed and let me know if I can assist you in the future.   These are the goals we discussed:  Goals      DIET - REDUCE SODIUM INTAKE     Per her cardiologist per patient     Prevent falls     Study surroundings carefully        This is a list of the screening recommended for you and due dates:  Health Maintenance  Topic Date Due   COVID-19 Vaccine (4 - Moderna risk series) 09/07/2020   Flu Shot  11/26/2022*   Medicare Annual Wellness Visit  07/11/2023   Tetanus Vaccine  04/21/2032   Pneumonia Vaccine  Completed   DEXA scan (bone density measurement)  Completed   Zoster (Shingles) Vaccine  Completed   HPV Vaccine  Aged Out  *Topic was postponed. The date shown is not the original due date.    Advanced directives: Please bring a copy of your health care power of attorney and living will to the office to be added to your chart at your convenience.   Conditions/risks identified: Aim for 30 minutes of exercise or brisk walking, 6-8 glasses of water, and 5 servings of fruits and vegetables each day.   Next appointment: Follow up in one year for your annual wellness visit    Preventive Care 65 Years and Older, Female Preventive care refers to lifestyle choices and visits with your health care provider that can promote health and wellness. What does preventive care include? A yearly physical exam. This is also called an annual well check. Dental exams once or twice a year. Routine eye exams. Ask your health care provider how often you should have your eyes checked. Personal lifestyle choices, including: Daily care of your teeth and gums. Regular physical activity. Eating a healthy diet. Avoiding tobacco and drug use. Limiting alcohol use. Practicing safe sex. Taking low-dose aspirin every  day. Taking vitamin and mineral supplements as recommended by your health care provider. What happens during an annual well check? The services and screenings done by your health care provider during your annual well check will depend on your age, overall health, lifestyle risk factors, and family history of disease. Counseling  Your health care provider may ask you questions about your: Alcohol use. Tobacco use. Drug use. Emotional well-being. Home and relationship well-being. Sexual activity. Eating habits. History of falls. Memory and ability to understand (cognition). Work and work Statistician. Reproductive health. Screening  You may have the following tests or measurements: Height, weight, and BMI. Blood pressure. Lipid and cholesterol levels. These may be checked every 5 years, or more frequently if you are over 62 years old. Skin check. Lung cancer screening. You may have this screening every year starting at age 18 if you have a 30-pack-year history of smoking and currently smoke or have quit within the past 15 years. Fecal occult blood test (FOBT) of the stool. You may have this test every year starting at age 38. Flexible sigmoidoscopy or colonoscopy. You may have a sigmoidoscopy every 5 years or a colonoscopy every 10 years starting at age 66. Hepatitis C blood test. Hepatitis B blood test. Sexually transmitted disease (STD) testing. Diabetes screening. This is done by checking your blood sugar (glucose) after you have not eaten for a while (fasting). You may have  this done every 1-3 years. Bone density scan. This is done to screen for osteoporosis. You may have this done starting at age 86. Mammogram. This may be done every 1-2 years. Talk to your health care provider about how often you should have regular mammograms. Talk with your health care provider about your test results, treatment options, and if necessary, the need for more tests. Vaccines  Your health care  provider may recommend certain vaccines, such as: Influenza vaccine. This is recommended every year. Tetanus, diphtheria, and acellular pertussis (Tdap, Td) vaccine. You may need a Td booster every 10 years. Zoster vaccine. You may need this after age 22. Pneumococcal 13-valent conjugate (PCV13) vaccine. One dose is recommended after age 22. Pneumococcal polysaccharide (PPSV23) vaccine. One dose is recommended after age 71. Talk to your health care provider about which screenings and vaccines you need and how often you need them. This information is not intended to replace advice given to you by your health care provider. Make sure you discuss any questions you have with your health care provider. Document Released: 09/10/2015 Document Revised: 05/03/2016 Document Reviewed: 06/15/2015 Elsevier Interactive Patient Education  2017 [employer] Prevention in the Home Falls can cause injuries. They can happen to people of all ages. There are many things you can do to make your home safe and to help prevent falls. What can I do on the outside of my home? Regularly fix the edges of walkways and driveways and fix any cracks. Remove anything that might make you trip as you walk through a door, such as a raised step or threshold. Trim any bushes or trees on the path to your home. Use bright outdoor lighting. Clear any walking paths of anything that might make someone trip, such as rocks or tools. Regularly check to see if handrails are loose or broken. Make sure that both sides of any steps have handrails. Any raised decks and porches should have guardrails on the edges. Have any leaves, snow, or ice cleared regularly. Use sand or salt on walking paths during winter. Clean up any spills in your garage right away. This includes oil or grease spills. What can I do in the bathroom? Use night lights. Install grab bars by the toilet and in the tub and shower. Do not use towel bars as grab  bars. Use non-skid mats or decals in the tub or shower. If you need to sit down in the shower, use a plastic, non-slip stool. Keep the floor dry. Clean up any water that spills on the floor as soon as it happens. Remove soap buildup in the tub or shower regularly. Attach bath mats securely with double-sided non-slip rug tape. Do not have throw rugs and other things on the floor that can make you trip. What can I do in the bedroom? Use night lights. Make sure that you have a light by your bed that is easy to reach. Do not use any sheets or blankets that are too big for your bed. They should not hang down onto the floor. Have a firm chair that has side arms. You can use this for support while you get dressed. Do not have throw rugs and other things on the floor that can make you trip. What can I do in the kitchen? Clean up any spills right away. Avoid walking on wet floors. Keep items that you use a lot in easy-to-reach places. If you need to reach something above you, use a strong step stool  that has a grab bar. Keep electrical cords out of the way. Do not use floor polish or wax that makes floors slippery. If you must use wax, use non-skid floor wax. Do not have throw rugs and other things on the floor that can make you trip. What can I do with my stairs? Do not leave any items on the stairs. Make sure that there are handrails on both sides of the stairs and use them. Fix handrails that are broken or loose. Make sure that handrails are as long as the stairways. Check any carpeting to make sure that it is firmly attached to the stairs. Fix any carpet that is loose or worn. Avoid having throw rugs at the top or bottom of the stairs. If you do have throw rugs, attach them to the floor with carpet tape. Make sure that you have a light switch at the top of the stairs and the bottom of the stairs. If you do not have them, ask someone to add them for you. What else can I do to help prevent  falls? Wear shoes that: Do not have high heels. Have rubber bottoms. Are comfortable and fit you well. Are closed at the toe. Do not wear sandals. If you use a stepladder: Make sure that it is fully opened. Do not climb a closed stepladder. Make sure that both sides of the stepladder are locked into place. Ask someone to hold it for you, if possible. Clearly mark and make sure that you can see: Any grab bars or handrails. First and last steps. Where the edge of each step is. Use tools that help you move around (mobility aids) if they are needed. These include: Canes. Walkers. Scooters. Crutches. Turn on the lights when you go into a dark area. Replace any light bulbs as soon as they burn out. Set up your furniture so you have a clear path. Avoid moving your furniture around. If any of your floors are uneven, fix them. If there are any pets around you, be aware of where they are. Review your medicines with your doctor. Some medicines can make you feel dizzy. This can increase your chance of falling. Ask your doctor what other things that you can do to help prevent falls. This information is not intended to replace advice given to you by your health care provider. Make sure you discuss any questions you have with your health care provider. Document Released: 06/10/2009 Document Revised: 01/20/2016 Document Reviewed: 09/18/2014 Elsevier Interactive Patient Education  2017 [employer].

## 2022-07-10 NOTE — Progress Notes (Signed)
Subjective:   Christina Silva is a 88 y.o. female who presents for Medicare Annual (Subsequent) preventive examination.   I connected with  [name] on 07/10/22 by a audio enabled telemedicine application and verified that I am speaking with the correct person using two identifiers.  Patient Location: Home  Provider Location: Home Office  I discussed the limitations of evaluation and management by telemedicine. The patient expressed understanding and agreed to proceed.  Review of Systems     Cardiac Risk Factors include: advanced age (>79mn, >>24women);hypertension     Objective:    Today's Vitals   07/10/22 1417  Weight: 134 lb (60.8 kg)  Height: '5\' 4"'$  (1.626 m)   Body mass index is 23 kg/m.     07/10/2022    2:22 PM 06/28/2021    2:24 PM 06/03/2020   10:13 AM 12/16/2019    1:20 PM 12/12/2019    1:20 PM 05/01/2019    2:09 PM 05/01/2019    1:55 PM  Advanced Directives  Does Patient Have a Medical Advance Directive? Yes Yes Yes  Yes Yes Yes  Type of AParamedicof [place]CorningLiving will [place]Marion CenterLiving will [place] will  [place] will Living will;Healthcare Power of Attorney   Does patient want to make changes to medical advance directive?   No - Patient declined  No - Patient declined No - Patient declined   Copy of HSt. [place]in Chart? No - copy requested No - copy requested No - copy requested No - copy requested No - copy requested No - copy requested     Current Medications (verified) Outpatient Encounter Medications as of 07/10/2022  Medication Sig   amLODipine (NORVASC) 2.5 MG tablet Take 1 tablet (2.5 mg total) by mouth every evening.   aspirin EC 81 MG tablet Take 81 mg by mouth daily. Swallow whole.   busPIRone (BUSPAR) 5 MG tablet Take 1 tablet (5 mg total) by mouth 2 (two) times daily as needed.   cholecalciferol (VITAMIN D) 25 MCG (1000  UNIT) tablet Take 2,000 Units by mouth daily.   escitalopram (LEXAPRO) 20 MG tablet Take 1 tablet (20 mg total) by mouth daily.   losartan (COZAAR) 100 MG tablet TAKE ONE (1) TABLET BY MOUTH EVERY DAY   megestrol (MEGACE) 20 MG tablet TAKE ONE (1) TABLET BY MOUTH EVERY DAY   pantoprazole (PROTONIX) 40 MG tablet Take 1 tablet (40 mg total) by mouth daily.   rosuvastatin (CRESTOR) 20 MG tablet Take 1 tablet (20 mg total) by mouth daily.   triamcinolone ointment (KENALOG) 0.5 % Apply 1 application. topically 2 (two) times daily. (Patient not taking: Reported on 07/10/2022)   No facility-administered encounter medications on file as of 07/10/2022.    Allergies (verified) Codeine, Lisinopril, Morphine, Other, Sulfa antibiotics, and Sulfonamide derivatives   History: Past Medical History:  Diagnosis Date   Anxiety    Cataract    GERD (gastroesophageal reflux disease)    Hyperlipidemia    Hypertension    SCCA (squamous cell carcinoma) of skin 11/02/2020   Mid Chest (in situ) (tx p bx)   SCCA (squamous cell carcinoma) of skin 11/02/2020   Right Forearm Posterior (in situ) (tx p bx)   SCCA (squamous cell carcinoma) of skin 11/02/2020   Left Submandibular Area (in situ) (tx p bx)   Stroke ([place] 09/2018   Vertigo    patient denies   Past Surgical History:  Procedure Laterality Date   ABDOMINAL HYSTERECTOMY  1980   CATARACT EXTRACTION Right    CHOLECYSTECTOMY N/A 12/16/2019   Procedure: LAPAROSCOPIC CHOLECYSTECTOMY WITH INTRAOPERATIVE CHOLANGIOGRAM;  Surgeon: Erroll Luna, MD;  Location: [facility];  Service: General;  Laterality: N/A;   EYE SURGERY     implantable loop recorder placement  11/12/2020    Medtronic Reveal Linq model [id number] implantable loop recorder (SN [id number]) implanted by Dr Rayann Heman for cryptogenic stroke   KNEE ARTHROSCOPY Left    ROTATOR CUFF REPAIR Left    SQUAMOUS CELL CARCINOMA EXCISION Left    TONSILLECTOMY     Family History  Problem Relation Age of Onset    Heart attack Mother    Hyperlipidemia Mother    Heart disease Mother    Transient ischemic attack Father    Stroke Father    Psychiatric Illness Sister    Heart disease Sister    Arthritis Brother    Epilepsy Brother    Colon cancer Neg Hx    Esophageal cancer Neg Hx    Rectal cancer Neg Hx    Stomach cancer Neg Hx    Pancreatic cancer Neg Hx    Social History   Socioeconomic History   Marital status: Widowed    Spouse name: Not on file   Number of children: 2   Years of education: 10   Highest education level: Not on file  Occupational History   Occupation: retired  Tobacco Use   Smoking status: Former    Types: Cigarettes    Quit date: 01/09/1983    Years since quitting: 39.5   Smokeless tobacco: Never  Vaping Use   Vaping Use: Never used  Substance and Sexual Activity   Alcohol use: No   Drug use: No   Sexual activity: Yes  Other Topics Concern   Not on file  Social History Narrative   Lives alone handicap accessible home - daughter in [place] and son in [place].   Right Handed   Drinks caffeine occassionally   Social Determinants of Health   Financial Resource Strain: Low Risk  (07/10/2022)   Overall Financial Resource Strain (CARDIA)    Difficulty of Paying Living Expenses: Not hard at all  Food Insecurity: No Food Insecurity (07/10/2022)   Hunger Vital Sign    Worried About Running Out of Food in the Last Year: Never true    Ran Out of Food in the Last Year: Never true  Transportation Needs: No Transportation Needs (07/10/2022)   PRAPARE - Hydrologist (Medical): No    Lack of Transportation (Non-Medical): No  Physical Activity: Insufficiently Active (07/10/2022)   Exercise Vital Sign    Days of Exercise per Week: 3 days    Minutes of Exercise per Session: 30 min  Stress: No Stress Concern Present (07/10/2022)   [place]    Feeling of Stress : Only  a little  Social Connections: Moderately Isolated (07/10/2022)   Social Connection and Isolation Panel [NHANES]    Frequency of Communication with Friends and Family: More than three times a week    Frequency of Social Gatherings with Friends and Family: More than three times a week    Attends Religious Services: 1 to 4 times per year    Active Member of [employer] or Organizations: No    Attends Archivist Meetings: Never    Marital Status: Widowed    Tobacco Counseling Counseling given: Not Answered  Clinical Intake:  Pre-visit preparation completed: Yes  Pain : No/denies pain     Nutritional Risks: None Diabetes: No  How often do you need to have someone help you when you read instructions, pamphlets, or other written materials from your doctor or pharmacy?: 1 - Never  Diabetic?no    Interpreter Needed?: No  Information entered by :: Jadene Pierini, LPN   Activities of Daily Living    07/10/2022    2:22 PM  In your present state of health, do you have any difficulty performing the following activities:  Hearing? 0  Vision? 0  Difficulty concentrating or making decisions? 0  Walking or climbing stairs? 0  Dressing or bathing? 0  Doing errands, shopping? 0  Preparing Food and eating ? N  Using the Toilet? N  In the past six months, have you accidently leaked urine? N  Do you have problems with loss of bowel control? N  Managing your Medications? N  Managing your Finances? N  Housekeeping or managing your Housekeeping? N    Patient Care Team: [name], FNP as PCP - General (Nurse Practitioner) [place], Peter M, MD as PCP - Cardiology (Cardiology) Phillips Odor, MD as Consulting Physician (Neurology) [place], Peter M, MD as Consulting Physician (Cardiology) Warren Danes, PA-C as Physician Assistant (Dermatology)  Indicate any recent Medical Services you may have received from other than [facility] providers in the past year (date may be  approximate).     Assessment:   This is a routine wellness examination for [place].  Hearing/Vision screen Vision Screening - Comments:: Annual eye exams wears glasses   Dietary issues and exercise activities discussed: Current Exercise Habits: Home exercise routine, Type of exercise: walking, Time (Minutes): 30, Frequency (Times/Week): 3, Weekly Exercise (Minutes/Week): 90, Intensity: Mild, Exercise limited by: None identified   Goals Addressed             This Visit's Progress    DIET - REDUCE SODIUM INTAKE   On track    Per her cardiologist per patient       Depression Screen    07/10/2022    2:21 PM 04/21/2022   11:44 AM 01/31/2022   12:12 PM 01/19/2022   11:50 AM 06/29/2021   11:13 AM 06/28/2021    2:11 PM 06/14/2021    9:16 AM  PHQ 2/9 Scores  PHQ - 2 Score 0 0 1 0 '6 2 2  '$ PHQ- 9 Score  '2 3 4 14 8 6    '$ Fall Risk    07/10/2022    2:19 PM 04/21/2022   11:44 AM 01/31/2022   12:11 PM 01/19/2022   11:50 AM 10/04/2021   12:06 PM  [place] in the past year? 0 0 0 0 0  Number falls in past yr: 0      Injury with Fall? 0      Risk for fall due to : No Fall Risks      Follow up Falls prevention discussed  Falls evaluation completed      [place]:  Any stairs in or around the home? No  If so, are there any without handrails? No  Home free of loose throw rugs in walkways, pet beds, electrical cords, etc? Yes  Adequate lighting in your home to reduce risk of falls? Yes   ASSISTIVE DEVICES UTILIZED TO PREVENT FALLS:  Life alert? No  Use of a cane, walker or w/c? No  Grab bars in  the bathroom? Yes  Shower chair or bench in shower? Yes  Elevated toilet seat or a handicapped toilet? Yes       05/24/2015   12:09 PM  MMSE - Mini Mental State Exam  Orientation to time 5  Orientation to Place 5  Registration 3  Attention/ Calculation 5  Recall 3  Language- name 2 objects 2  Language- repeat 1  Language- follow 3 step  command 3  Language- read & follow direction 1  Write a sentence 1  Copy design 1  Total score 30        07/10/2022    2:22 PM 06/28/2021    2:18 PM 06/03/2020   10:16 AM 05/01/2019    1:57 PM  6CIT Screen  What Year? 0 points 0 points 0 points 0 points  What month? 0 points 0 points 0 points 0 points  What time? 0 points 0 points 0 points 0 points  Count back from 20 0 points 0 points 0 points 0 points  Months in reverse 0 points 2 points 2 points 0 points  Repeat phrase 0 points 2 points 2 points 0 points  Total Score 0 points 4 points 4 points 0 points    Immunizations Immunization History  Administered Date(s) Administered   Fluad Quad(high Dose 65+) 05/29/2019, 08/16/2020, 06/14/2021   Influenza, High Dose Seasonal PF 10/03/2016, 09/03/2017, 06/10/2018   Influenza,inj,Quad PF,6+ Mos 05/24/2015   Influenza-Unspecified 07/18/2018   Moderna Sars-Covid-2 Vaccination 09/09/2019, 10/10/2019, 07/13/2020   Pneumococcal Conjugate-13 05/24/2015   Pneumococcal Polysaccharide-23 05/29/2019   Td 04/21/2022   Tdap 12/19/2010   Zoster Recombinat (Shingrix) 06/30/2019, 11/18/2019    TDAP status: Up to date  Flu Vaccine status: Due, Education has been provided regarding the importance of this vaccine. Advised may receive this vaccine at local pharmacy or Health Dept. Aware to provide a copy of the vaccination record if obtained from local pharmacy or Health Dept. Verbalized acceptance and understanding.  Pneumococcal vaccine status: Up to date  Covid-19 vaccine status: Completed vaccines  Qualifies for Shingles Vaccine? Yes   Zostavax completed Yes   Shingrix Completed?: Yes  Screening Tests Health Maintenance  Topic Date Due   COVID-19 Vaccine (4 - Moderna risk series) 09/07/2020   INFLUENZA VACCINE  11/26/2022 (Originally 03/28/2022)   Medicare Annual Wellness (AWV)  07/11/2023   TETANUS/TDAP  04/21/2032   Pneumonia Vaccine 36+ Years old  Completed   DEXA SCAN  Completed    Zoster Vaccines- Shingrix  Completed   HPV VACCINES  Aged Out    Health Maintenance  Health Maintenance Due  Topic Date Due   COVID-19 Vaccine (4 - Moderna risk series) 09/07/2020    Colorectal cancer screening: No longer required.   Mammogram status: No longer required due to age.  Bone Density status: Completed 05/18/2015. Results reflect: Bone density results: OSTEOPENIA. Repeat every 5 years.  Lung Cancer Screening: (Low Dose CT Chest recommended if Age 50-80 years, 30 pack-year currently smoking OR have quit w/in 15years.) does not qualify.   Lung Cancer Screening Referral: n/a  Additional Screening:  Hepatitis C Screening: does not qualify;  Vision Screening: Recommended annual ophthalmology exams for early detection of glaucoma and other disorders of the eye. Is the patient up to date with their annual eye exam?  Yes  Who is the provider or what is the name of the office in which the patient attends annual eye exams? Dr.groat  If pt is not established with a provider, would they like  to be referred to a provider to establish care? No .   Dental Screening: Recommended annual dental exams for proper oral hygiene  Community Resource Referral / Chronic Care Management: CRR required this visit?  No   CCM required this visit?  No      Plan:     I have personally reviewed and noted the following in the patient's chart:   Medical and social history Use of alcohol, tobacco or illicit drugs  Current medications and supplements including opioid prescriptions. Patient is not currently taking opioid prescriptions. Functional ability and status Nutritional status Physical activity Advanced directives List of other physicians Hospitalizations, surgeries, and ER visits in previous 12 months Vitals Screenings to include cognitive, depression, and falls Referrals and appointments  In addition, I have reviewed and discussed with patient certain preventive protocols,  quality metrics, and best practice recommendations. A written personalized care plan for preventive services as well as general preventive health recommendations were provided to patient.     Daphane Shepherd, LPN   [id number]   Nurse Notes: Due flu vaccine

## 2022-07-17 ENCOUNTER — Other Ambulatory Visit: Payer: Self-pay | Admitting: Family

## 2022-07-17 DIAGNOSIS — F410 Panic disorder [episodic paroxysmal anxiety] without agoraphobia: Secondary | ICD-10-CM

## 2022-07-17 DIAGNOSIS — F411 Generalized anxiety disorder: Secondary | ICD-10-CM

## 2022-07-27 ENCOUNTER — Ambulatory Visit: Payer: Medicare Other

## 2022-07-28 ENCOUNTER — Encounter: Payer: Self-pay | Admitting: Nurse Practitioner

## 2022-07-28 ENCOUNTER — Ambulatory Visit (INDEPENDENT_AMBULATORY_CARE_PROVIDER_SITE_OTHER): Payer: Medicare Other | Admitting: Nurse Practitioner

## 2022-07-28 VITALS — BP 126/66 | HR 83 | Temp 98.6°F | Resp 20 | Ht 64.0 in | Wt 137.0 lb

## 2022-07-28 DIAGNOSIS — I499 Cardiac arrhythmia, unspecified: Secondary | ICD-10-CM

## 2022-07-28 DIAGNOSIS — Z23 Encounter for immunization: Secondary | ICD-10-CM | POA: Diagnosis not present

## 2022-07-28 NOTE — Progress Notes (Signed)
   Subjective:    Patient ID: Christina Silva, female    DOB: 04-08-1933, 88 y.o.   MRN: [id number]  Chief Complaint: ? irregular heartbeat (Home health nurse came out for a visit and told her to be checked)   HPI  Home health nurse came out to see patient and said her heart was irregular and she needed to be seen. She has been feeling well. Last EKG was done on 06/08/22 and showed NSR. Had episode of chest pain yesterday. Was an achy pain. She took some tums and felt better. Was just like episode she had at end of October and she came in to see C. Hawks. Was dx with panic attack.    Review of Systems  Respiratory:  Negative for shortness of breath.   Cardiovascular:  Positive for chest pain. Negative for palpitations and leg swelling.       Objective:   Physical Exam Constitutional:      Appearance: Normal appearance.  Cardiovascular:     Rate and Rhythm: Normal rate and regular rhythm.     Heart sounds: Normal heart sounds.  Pulmonary:     Breath sounds: Normal breath sounds.  Abdominal:     General: Abdomen is flat. Bowel sounds are normal.     Palpations: Abdomen is soft.     Tenderness: There is no abdominal tenderness.  Skin:    General: Skin is warm and dry.  Neurological:     General: No focal deficit present.     Mental Status: She is alert and oriented to person, place, and time.    BP 126/66   Pulse 83   Temp 98.6 F (37 C) (Temporal)   Resp 20   Ht '5\' 4"'$  (1.626 m)   Wt 137 lb (62.1 kg)   SpO2 94%   BMI 23.52 kg/m   EKG NSR with occasional Rhys Martini, FNP       Assessment & Plan:  Christina Silva in today with chief complaint of ? irregular heartbeat (Home health nurse came out for a visit and told her to be checked)   1. Irregular heart beat RTO if continues PAC's are intermittent Avoid over consumption of caffeine - EKG 12-Lead    The above assessment and management plan was discussed with the patient. The patient  verbalized understanding of and has agreed to the management plan. Patient is aware to call the clinic if symptoms persist or worsen. Patient is aware when to return to the clinic for a follow-up visit. Patient educated on when it is appropriate to go to the emergency department.   Mary-Margaret Hassell Done, FNP

## 2022-07-28 NOTE — Patient Instructions (Signed)
Premature Atrial Contraction  A premature atrial contraction [facility]) is a kind of irregular heartbeat (arrhythmia). The heart has four chambers, including the upper chambers (atria) and lower chambers (ventricles). Normally, an electrical signal starts in a group of cells called the sinoatrial node (SA node) and travels through the atria, causing them to pump blood into the ventricles. During a PAC, the atria beat too early, before they have had time to fill with blood. The heartbeat pauses afterward so the heart can fill with blood for the next beat. Sometimes PAC can be a warning sign of another type of arrhythmia called atrial fibrillation. Atrial fibrillation may allow blood to pool in the atria and form clots. If a clot travels to the brain, it can cause a stroke. What are the causes? The cause of this condition is often unknown. Sometimes, this condition may be caused by heart disease or injury to the heart. What increases the risk? You are more likely to develop this condition if: You have heart disease. You are 40 years of age or older. Episodes may be triggered by: Tobacco, alcohol, or caffeine use. Stimulant drugs. Some medicines or supplements. Stress and anxiety. What are the signs or symptoms? Symptoms of this condition include: The feeling of a pause in the heartbeat. The first heartbeat after the "skipped" beat may feel more forceful. A feeling that your heart is fluttering. A quick feeling of dizziness or faintness. How is this diagnosed? This condition is diagnosed based on: Your medical history or symptoms. A physical exam. Your health care provider may listen to your heart. An ECG (electrocardiogram) to monitor the electrical activity of your heart. An ambulatory cardiac monitor that records your heartbeats for 24 hours or more. You may also have: Blood tests. An echocardiogram, which creates an image of your heart. How is this treated? Treatment depends on how often  your symptoms happen and other risk factors. Treatments may include: Medicines. A catheter ablation procedure to destroy the part of the heart tissue that sends abnormal signals. In many cases, treatment may not be needed. Follow these instructions at home: Lifestyle  Do not use any products that contain nicotine or tobacco. These products include cigarettes, chewing tobacco, and vaping devices, such as e-cigarettes. If you need help quitting, ask your health care provider. Exercise regularly. Ask your health care provider what type of exercise is safe for you. Try to get at least 7-9 hours of sleep each night. Find healthy ways to manage stress. Avoid stressful situations when possible. Alcohol use Do not drink alcohol if: Your health care provider tells you not to drink. You are pregnant, may be pregnant, or are planning to become pregnant. Alcohol triggers your episodes. If you drink alcohol: Limit how much you have to: 0-1 drink a day for women. 0-2 drinks a day for men. Know how much alcohol is in your drink. In the [place] one drink equals one 12 oz bottle of beer (355 mL), one 5 oz glass of wine (148 mL), or one 1 oz glass of hard liquor (44 mL). General instructions Take over-the-counter and prescription medicines only as told by your health care provider. If caffeine triggers episodes, do not eat, drink, or use anything with caffeine in it. Contact a health care provider if: You feel your heart is fluttering. Your heart skips beats and you feel dizzy, light-headed, or very tired. Get help right away if: You have chest pain. You have trouble breathing. You faint. You have any symptoms of  a stroke. "BE FAST" is an easy way to remember the main warning signs of a stroke: B - Balance. Signs are dizziness, sudden trouble walking, or loss of balance. E - Eyes. Signs are trouble seeing or a sudden change in vision. F - Face. Signs are sudden weakness or numbness of the face, or the  face or eyelid drooping on one side. A - Arms. Signs are weakness or numbness in an arm. This happens suddenly and usually on one side of the body. S - Speech. Signs are sudden trouble speaking, slurred speech, or trouble understanding what people say. T - Time. Time to call emergency services. Write down what time symptoms started. You have other signs of stroke, such as: A sudden, severe headache with no known cause. Nausea or vomiting. Seizure. These symptoms may be an emergency. Get help right away. Call 911. Do not wait to see if the symptoms will go away. Do not drive yourself to the hospital. This information is not intended to replace advice given to you by your health care provider. Make sure you discuss any questions you have with your health care provider. Document Revised: 01/13/2022 Document Reviewed: 01/13/2022 Elsevier Patient Education  [place].

## 2022-08-30 DIAGNOSIS — H532 Diplopia: Secondary | ICD-10-CM | POA: Diagnosis not present

## 2022-08-30 DIAGNOSIS — H35373 Puckering of macula, bilateral: Secondary | ICD-10-CM | POA: Diagnosis not present

## 2022-08-30 DIAGNOSIS — Z961 Presence of intraocular lens: Secondary | ICD-10-CM | POA: Diagnosis not present

## 2022-10-04 ENCOUNTER — Other Ambulatory Visit: Payer: Self-pay | Admitting: Nurse Practitioner

## 2022-10-04 DIAGNOSIS — I10 Essential (primary) hypertension: Secondary | ICD-10-CM

## 2022-10-06 NOTE — Progress Notes (Deleted)
Cardiology Office Note:    Date:  10/06/2022   ID:  DAGNEY SAVASTANO, DOB February 07, 1933, MRN [id number]  PCP:  [name], FNP   [facility] Providers Cardiologist:  Carole Doner Martinique, MD     Referring MD: Hassell Done, Mary-Margaret, *   No chief complaint on file.   History of Present Illness:    Christina Silva is a 89 y.o. female with a hx of HTN, HLD, h/o CVA, GERD, anxiety and vertigo.  Patient was seen by Dr. Martinique in May 2019 for blood pressure management and lower extremity edema.  Echocardiogram at the time showed EF 55 to 60%, grade 1 DD, mild AI, mild to moderate MR/TR.  He February 2020, she suffered cryptogenic stroke and was placed on aspirin and Plavix for 1 month then aspirin alone after that.  Repeat echocardiogram in February 2020 showed EF 60 to 65%, grade 1 DD, basal septal hypertrophy, mild AI, moderate aortic annular calcifications, mild mitral annular calcification.  She had a TIA in July 2020 and was restarted on Plavix.  Plavix was later discontinued again by Dr. Leonie Man of neurology.  She was seen in August 2020 with atypical chest pain and negative troponin.  She was prescribed pantoprazole.  Follow-up Myoview was normal.  She was started on amlodipine for hypertension.  MRI of the brain obtained in January 2022 showed a new right posterior cerebral artery occlusion close to its origin and a stable appearance of moderate P1/P2 stenosis.  An implantable loop recorder was placed.  No arrhythmia noted to date.   She does note symptoms of diplopia. Had MRI in November without change. Followed by ophthalmology at [facility].   Past Medical History:  Diagnosis Date   Anxiety    Cataract    GERD (gastroesophageal reflux disease)    Hyperlipidemia    Hypertension    SCCA (squamous cell carcinoma) of skin 11/02/2020   Mid Chest (in situ) (tx p bx)   SCCA (squamous cell carcinoma) of skin 11/02/2020   Right Forearm Posterior (in situ) (tx p bx)   SCCA (squamous cell  carcinoma) of skin 11/02/2020   Left Submandibular Area (in situ) (tx p bx)   Stroke ([place]) 09/2018   Vertigo    patient denies    Past Surgical History:  Procedure Laterality Date   ABDOMINAL HYSTERECTOMY  1980   CATARACT EXTRACTION Right    CHOLECYSTECTOMY N/A 12/16/2019   Procedure: LAPAROSCOPIC CHOLECYSTECTOMY WITH INTRAOPERATIVE CHOLANGIOGRAM;  Surgeon: Erroll Luna, MD;  Location: [facility] OR;  Service: General;  Laterality: N/A;   EYE SURGERY     implantable loop recorder placement  11/12/2020    Medtronic Reveal Linq model [id number] implantable loop recorder (SN [id number]) implanted by Dr Rayann Heman for cryptogenic stroke   KNEE ARTHROSCOPY Left    ROTATOR CUFF REPAIR Left    SQUAMOUS CELL CARCINOMA EXCISION Left    TONSILLECTOMY      Current Medications: No outpatient medications have been marked as taking for the 10/12/22 encounter (Appointment) with Martinique, Tilden Broz M, MD.     Allergies:   Codeine, Lisinopril, Morphine, Other, Sulfa antibiotics, and Sulfonamide derivatives   Social History   Socioeconomic History   Marital status: Widowed    Spouse name: Not on file   Number of children: 2   Years of education: 10   Highest education level: Not on file  Occupational History   Occupation: retired  Tobacco Use   Smoking status: Former    Types: Cigarettes    Quit  date: 01/09/1983    Years since quitting: 39.7   Smokeless tobacco: Never  Vaping Use   Vaping Use: Never used  Substance and Sexual Activity   Alcohol use: No   Drug use: No   Sexual activity: Yes  Other Topics Concern   Not on file  Social History Narrative   Lives alone handicap accessible home - daughter in [place] and son in [place].   Right Handed   Drinks caffeine occassionally   Social Determinants of Health   Financial Resource Strain: Low Risk  (07/10/2022)   Overall Financial Resource Strain (CARDIA)    Difficulty of Paying Living Expenses: Not hard at all  Food Insecurity: No Food  Insecurity (07/10/2022)   Hunger Vital Sign    Worried About Running Out of Food in the Last Year: Never true    Ran Out of Food in the Last Year: Never true  Transportation Needs: No Transportation Needs (07/10/2022)   PRAPARE - Hydrologist (Medical): No    Lack of Transportation (Non-Medical): No  Physical Activity: Insufficiently Active (07/10/2022)   Exercise Vital Sign    Days of Exercise per Week: 3 days    Minutes of Exercise per Session: 30 min  Stress: No Stress Concern Present (07/10/2022)   [place]    Feeling of Stress : Only a little  Social Connections: Moderately Isolated (07/10/2022)   Social Connection and Isolation Panel [NHANES]    Frequency of Communication with Friends and Family: More than three times a week    Frequency of Social Gatherings with Friends and Family: More than three times a week    Attends Religious Services: 1 to 4 times per year    Active Member of [employer] or Organizations: No    Attends Archivist Meetings: Never    Marital Status: Widowed     Family History: The patient's family history includes Arthritis in her brother; Epilepsy in her brother; Heart attack in her mother; Heart disease in her mother and sister; Hyperlipidemia in her mother; Psychiatric Illness in her sister; Stroke in her father; Transient ischemic attack in her father. There is no history of Colon cancer, Esophageal cancer, Rectal cancer, Stomach cancer, or Pancreatic cancer.  ROS:   Please see the history of present illness.     All other systems reviewed and are negative.  EKGs/Labs/Other Studies Reviewed:    The following studies were reviewed today:  Echo 10/18/2018 1. The left ventricle has normal systolic function with an ejection  fraction of 60-65%. The cavity size was normal. There is basal septal  hypertophy. Left ventricular diastolic Doppler parameters  are consistent  with impaired relaxation.   2. The right ventricle has normal systolic function. The cavity was  normal. There is no increase in right ventricular wall thickness.   3. The aortic valve is tricuspid Moderate thickening of the aortic valve  Moderate calcification of the aortic valve. Aortic valve regurgitation is  mild by color flow Doppler.. Moderate aortic annular calcification noted.   4. The mitral valve is normal in structure. Mild thickening of the mitral  valve leaflet. Mild calcification of the mitral valve leaflet. There is  mild mitral annular calcification present. No evidence of mitral valve  stenosis.   5. The tricuspid valve is normal in structure.   6. The pulmonic valve was grossly normal. Pulmonic valve regurgitation is  mild by color flow Doppler.   7.  The aortic root is normal in size and structure.   8. Pulmonary hypertension is indeterminat,inadequate TR jet.   EKG:  EKG is not ordered today.    Recent Labs: 06/08/2022: ALT 14; BUN 15; Creatinine, Ser 0.64; Hemoglobin 15.0; Platelets 279; Potassium 3.8; Sodium 141  Recent Lipid Panel    Component Value Date/Time   CHOL 153 04/21/2022 1309   TRIG 95 04/21/2022 1309   TRIG 149 02/26/2014 0930   HDL 58 04/21/2022 1309   HDL 68 02/26/2014 0930   CHOLHDL 2.6 04/21/2022 1309   CHOLHDL 3.6 10/18/2018 0625   VLDL 27 10/18/2018 0625   LDLCALC 77 04/21/2022 1309   LDLCALC 144 (H) 02/26/2014 0930     Risk Assessment/Calculations:           Physical Exam:    VS:  There were no vitals taken for this visit.    Wt Readings from Last 3 Encounters:  07/28/22 137 lb (62.1 kg)  07/10/22 134 lb (60.8 kg)  06/08/22 127 lb (57.6 kg)     GEN:  Well nourished, well developed in no acute distress HEENT: Normal NECK: No JVD; No carotid bruits LYMPHATICS: No lymphadenopathy CARDIAC: RRR, no murmurs, rubs, gallops RESPIRATORY:  Clear to auscultation without rales, wheezing or rhonchi  ABDOMEN: Soft,  non-tender, non-distended MUSCULOSKELETAL:  No edema; No deformity  SKIN: Warm and dry NEUROLOGIC:  Alert and oriented x 3 PSYCHIATRIC:  Normal affect   ASSESSMENT:    No diagnosis found.   PLAN:    In order of problems listed above:   Hyperlipidemia: LDL remains elevated, increase Crestor to 20 mg daily.  LDL improved to 77. continue  Hypertension: Blood pressure stable  History of crytogenic stroke: No recurrence. No arrhythmia on ILR      Follow up in one year.  Medication Adjustments/Labs and Tests Ordered: Current medicines are reviewed at length with the patient today.  Concerns regarding medicines are outlined above.  No orders of the defined types were placed in this encounter.  No orders of the defined types were placed in this encounter.   There are no Patient Instructions on file for this visit.   Signed, Durant Scibilia Martinique, MD  10/06/2022 1:08 PM    [place]

## 2022-10-12 ENCOUNTER — Ambulatory Visit: Payer: Medicare Other | Admitting: Cardiology

## 2022-10-16 NOTE — Progress Notes (Unsigned)
Cardiology Office Note:    Date:  10/18/2022   ID:  Christina Silva, DOB 1933-08-07, MRN [id number]  PCP:  [name], [place] Providers Cardiologist:  Peter Martinique, MD     Referring MD: Hassell Done, Mary-Margaret, *   No chief complaint on file.   History of Present Illness:    Christina Silva is a 89 y.o. female with a hx of hypertension, GERD, hyperlipidemia, cryptogenic CVA, s/p loop recorder.  She initially establish care with our office in May 2019 for blood pressure management and lower extremity edema.  Echocardiogram at the time showed an EF of 55 to 60%, grade 1 DD, mild AI, mild to moderate MR/TR.  In February 2020 she suffered a cryptogenic stroke and was placed on aspirin and Plavix for 1 month, then aspirin alone after that.  She had a TIA in July 2020 and Plavix was restarted, which was eventually discontinued again by Dr. Leonie Man.  She had MRI of the brain in January 2022 which showed a new right posterior cerebral artery occlusion, which led to an implantable loop recorder placement.  Most recently evaluated in the office with Dr. Rayann Heman on 12/01/2021, at that time Christina Silva was doing well from a cardiac perspective.  There were concerns with continued remote monitoring related to the ILR device, ultimate decision was to leave the recorder in place and turn off the remote transmission feature.  She was advised she could asked Dr. Martinique to interrogate her recorder at future visits.   She presents today accompanied by her daughter and her niece, she states she is doing well from a cardiac perspective.  She does intermittently notice palpitations, there are no accompanying symptoms with them however they do make her feel anxious.  She checks her blood pressure at home and states that it is typically in the 120/60 range, but at times she has noticed it in the [id number] systolic.  She remains independent in very active. She denies chest pain, dyspnea, pnd,  orthopnea, n, v, dizziness, syncope, edema, weight gain, or early satiety.     Past Medical History:  Diagnosis Date   Anxiety    Cataract    GERD (gastroesophageal reflux disease)    Hyperlipidemia    Hypertension    SCCA (squamous cell carcinoma) of skin 11/02/2020   Mid Chest (in situ) (tx p bx)   SCCA (squamous cell carcinoma) of skin 11/02/2020   Right Forearm Posterior (in situ) (tx p bx)   SCCA (squamous cell carcinoma) of skin 11/02/2020   Left Submandibular Area (in situ) (tx p bx)   Stroke ([place]) 09/2018   Vertigo    patient denies    Past Surgical History:  Procedure Laterality Date   ABDOMINAL HYSTERECTOMY  1980   CATARACT EXTRACTION Right    CHOLECYSTECTOMY N/A 12/16/2019   Procedure: LAPAROSCOPIC CHOLECYSTECTOMY WITH INTRAOPERATIVE CHOLANGIOGRAM;  Surgeon: Erroll Luna, MD;  Location: [facility] OR;  Service: General;  Laterality: N/A;   EYE SURGERY     implantable loop recorder placement  11/12/2020    Medtronic Reveal Linq model [id number] implantable loop recorder (SN [id number]) implanted by Dr Rayann Heman for cryptogenic stroke   KNEE ARTHROSCOPY Left    ROTATOR CUFF REPAIR Left    SQUAMOUS CELL CARCINOMA EXCISION Left    TONSILLECTOMY      Current Medications: Current Meds  Medication Sig   amLODipine (NORVASC) 2.5 MG tablet Take 1 tablet (2.5 mg total) by mouth every evening.   aspirin  EC 81 MG tablet Take 81 mg by mouth daily. Swallow whole.   busPIRone (BUSPAR) 5 MG tablet TAKE ONE TABLET BY MOUTH TWICE DAILY AS NEEDED   cholecalciferol (VITAMIN D) 25 MCG (1000 UNIT) tablet Take 2,000 Units by mouth daily.   escitalopram (LEXAPRO) 20 MG tablet Take 1 tablet (20 mg total) by mouth daily.   losartan (COZAAR) 100 MG tablet TAKE ONE (1) TABLET BY MOUTH EVERY DAY   megestrol (MEGACE) 20 MG tablet TAKE ONE (1) TABLET BY MOUTH EVERY DAY   pantoprazole (PROTONIX) 40 MG tablet Take 1 tablet (40 mg total) by mouth daily.   rosuvastatin (CRESTOR) 20 MG tablet Take 1 tablet  (20 mg total) by mouth daily.   triamcinolone ointment (KENALOG) 0.5 % Apply 1 application. topically 2 (two) times daily.     Allergies:   Codeine, Lisinopril, Morphine, Other, Sulfa antibiotics, and Sulfonamide derivatives   Social History   Socioeconomic History   Marital status: Widowed    Spouse name: Not on file   Number of children: 2   Years of education: 10   Highest education level: Not on file  Occupational History   Occupation: retired  Tobacco Use   Smoking status: Former    Types: Cigarettes    Quit date: 01/09/1983    Years since quitting: 39.8   Smokeless tobacco: Never  Vaping Use   Vaping Use: Never used  Substance and Sexual Activity   Alcohol use: No   Drug use: No   Sexual activity: Yes  Other Topics Concern   Not on file  Social History Narrative   Lives alone handicap accessible home - daughter in [place] and son in [place].   Right Handed   Drinks caffeine occassionally   Social Determinants of Health   Financial Resource Strain: Low Risk  (07/10/2022)   Overall Financial Resource Strain (CARDIA)    Difficulty of Paying Living Expenses: Not hard at all  Food Insecurity: No Food Insecurity (07/10/2022)   Hunger Vital Sign    Worried About Running Out of Food in the Last Year: Never true    Ran Out of Food in the Last Year: Never true  Transportation Needs: No Transportation Needs (07/10/2022)   PRAPARE - Hydrologist (Medical): No    Lack of Transportation (Non-Medical): No  Physical Activity: Insufficiently Active (07/10/2022)   Exercise Vital Sign    Days of Exercise per Week: 3 days    Minutes of Exercise per Session: 30 min  Stress: No Stress Concern Present (07/10/2022)   [place]    Feeling of Stress : Only a little  Social Connections: Moderately Isolated (07/10/2022)   Social Connection and Isolation Panel [NHANES]    Frequency  of Communication with Friends and Family: More than three times a week    Frequency of Social Gatherings with Friends and Family: More than three times a week    Attends Religious Services: 1 to 4 times per year    Active Member of [employer] or Organizations: No    Attends Archivist Meetings: Never    Marital Status: Widowed     Family History: The patient's family history includes Arthritis in her brother; Epilepsy in her brother; Heart attack in her mother; Heart disease in her mother and sister; Hyperlipidemia in her mother; Psychiatric Illness in her sister; Stroke in her father; Transient ischemic attack in her father. There is no history of  Colon cancer, Esophageal cancer, Rectal cancer, Stomach cancer, or Pancreatic cancer.  ROS:   Please see the history of present illness.     All other systems reviewed and are negative.  EKGs/Labs/Other Studies Reviewed:    The following studies were reviewed today:   EKG:  EKG is not ordered today.    Recent Labs: 06/08/2022: ALT 14; BUN 15; Creatinine, Ser 0.64; Hemoglobin 15.0; Platelets 279; Potassium 3.8; Sodium 141  Recent Lipid Panel    Component Value Date/Time   CHOL 153 04/21/2022 1309   TRIG 95 04/21/2022 1309   TRIG 149 02/26/2014 0930   HDL 58 04/21/2022 1309   HDL 68 02/26/2014 0930   CHOLHDL 2.6 04/21/2022 1309   CHOLHDL 3.6 10/18/2018 0625   VLDL 27 10/18/2018 0625   LDLCALC 77 04/21/2022 1309   LDLCALC 144 (H) 02/26/2014 0930     Risk Assessment/Calculations:                Physical Exam:    VS:  BP 120/60 Comment: at home  Pulse 85   Ht 5' 4"$  (1.626 m)   Wt 139 lb 12.8 oz (63.4 kg)   SpO2 96%   BMI 24.00 kg/m     Wt Readings from Last 3 Encounters:  10/18/22 139 lb 12.8 oz (63.4 kg)  07/28/22 137 lb (62.1 kg)  07/10/22 134 lb (60.8 kg)     GEN: Appears younger than stated age, well nourished, well developed in no acute distress HEENT: Normal NECK: No JVD; No carotid bruits LYMPHATICS:  No lymphadenopathy CARDIAC: RRR, no murmurs, rubs, gallops RESPIRATORY:  Clear to auscultation without rales, wheezing or rhonchi  ABDOMEN: Soft, non-tender, non-distended MUSCULOSKELETAL:  No edema; No deformity  SKIN: Warm and dry NEUROLOGIC:  Alert and oriented x 3 PSYCHIATRIC:  Normal affect   ASSESSMENT:    1. Cryptogenic stroke ([place])   2. History of loop recorder   3. Hyperlipidemia with target LDL less than 100   4. Primary hypertension   5. Palpitations    PLAN:    In order of problems listed above:  Cryptogenic stroke -s/p loop recorder, this was previously turned off from receiving remote transmissions secondary to cost, and remains in place.  Hypertension -blood pressure today was slightly elevated at 140/70, she does check her blood pressure at home and states that it is typically in the 120/60 range.  Continue amlodipine, losartan.  Hyperlipidemia -LDL 77 on 04/21/2022, currently controlled, continue rosuvastatin.  Palpitations -it was reported to her that she had an abnormal EKG at her recent MD visit, reviewed this twelve-lead which showed normal sinus rhythm with PACs.  She reports palpitations that are bothersome to her, and worsen her anxiety.  Will arrange a 3-day monitor.  Will check CBC, BMET, TSH, magnesium to rule out any organic causes of palpitations.  Disposition-3-day monitor, CBC, BMet, TSH, magnesium. Return in 8 weeks.            Medication Adjustments/Labs and Tests Ordered: Current medicines are reviewed at length with the patient today.  Concerns regarding medicines are outlined above.  Orders Placed This Encounter  Procedures   CBC   Basic metabolic panel   TSH   Magnesium   LONG TERM MONITOR (3-14 DAYS)   No orders of the defined types were placed in this encounter.   Patient Instructions  Medication Instructions:  The current medical regimen is effective;  continue present plan and medications as directed. Please refer to the  Current Medication  list given to you today.  *If you need a refill on your cardiac medications before your next appointment, please call your pharmacy*  Lab Work: [facility] AND [facility] TODAY If you have labs (blood work) drawn today and your tests are completely normal, you will receive your results only by: [place] (if you have MyChart) OR A paper copy in the mail If you have any lab test that is abnormal or we need to change your treatment, we will call you to review the results.   Testing/Procedures: Your physician has requested you wear a ZIO patch monitor for 3 days, SEE BELOW   Follow-Up: At [facility], you and your health needs are our priority.  As part of our continuing mission to provide you with exceptional heart care, we have created designated Provider Care Teams.  These Care Teams include your primary Cardiologist (physician) and Advanced Practice Providers (APPs -  Physician Assistants and Nurse Practitioners) who all work together to provide you with the care you need, when you need it. Your next appointment:   8 week(s)  Provider:   Coletta Memos, FNP-C   Other Instructions ZIO XT- Long Term Monitor Instructions  Your physician has requested you wear a ZIO patch monitor for 3 days.  This is a single patch monitor. [employer] supplies one patch monitor per enrollment. Additional stickers are not available. Please do not apply patch if you will be having a Nuclear Stress Test,  Echocardiogram, Cardiac CT, MRI, or Chest Xray during the period you would be wearing the  monitor. The patch cannot be worn during these tests. You cannot remove and re-apply the  ZIO XT patch monitor.  Your ZIO patch monitor will be mailed 3 day USPS to your address on file. It may take 3-5 days  to receive your monitor after you have been enrolled.  Once you have received your monitor, please review the enclosed instructions. Your monitor  has already been registered assigning  a specific monitor serial # to you.  Billing and Patient Assistance Program Information  We have supplied [employer] with any of your insurance information on file for billing purposes. [employer] offers a sliding scale Patient Assistance Program for patients that do not have  insurance, or whose insurance does not completely cover the cost of the ZIO monitor.  You must apply for the Patient Assistance Program to qualify for this discounted rate.  To apply, please call [employer] at [phone], select option 4, select option 2, ask to apply for  Patient Assistance Program. Theodore Demark will ask your household income, and how many people  are in your household. They will quote your out-of-pocket cost based on that information.  [employer] will also be able to set up a 61-month interest-free payment plan if needed.  Applying the monitor   Shave hair from upper left chest.  Hold abrader disc by orange tab. Rub abrader in 40 strokes over the upper left chest as  indicated in your monitor instructions.  Clean area with 4 enclosed alcohol pads. Let dry.  Apply patch as indicated in monitor instructions. Patch will be placed under collarbone on left  side of chest with arrow pointing upward.  Rub patch adhesive wings for 2 minutes. Remove white label marked "1". Remove the white  label marked "2". Rub patch adhesive wings for 2 additional minutes.  While looking in a mirror, press and release button in center of patch. A small green light will  flash 3-4 times. This will be  your only indicator that the monitor has been turned on.  Do not shower for the first 24 hours. You may shower after the first 24 hours.  Press the button if you feel a symptom. You will hear a small click. Record Date, Time and  Symptom in the Patient Logbook.  When you are ready to remove the patch, follow instructions on the last 2 pages of Patient  Logbook. Stick patch monitor onto the last page of Patient Logbook.  Place Patient  Logbook in the blue and white box. Use locking tab on box and tape box closed  securely. The blue and white box has prepaid postage on it. Please place it in the mailbox as  soon as possible. Your physician should have your test results approximately 7 days after the  monitor has been mailed back to [facility].  Call [place] at [phone] if you have questions regarding  your ZIO XT patch monitor. Call them immediately if you see an orange light blinking on your  monitor.  If your monitor falls off in less than 4 days, contact our Monitor department at [phone].  If your monitor becomes loose or falls off after 4 days call [employer] at [phone] for  suggestions on securing your monitor     Signed, Trudi Ida, NP  10/18/2022 3:15 PM    [place]

## 2022-10-18 ENCOUNTER — Ambulatory Visit: Payer: Medicare Other | Attending: Cardiology | Admitting: Cardiology

## 2022-10-18 ENCOUNTER — Encounter: Payer: Self-pay | Admitting: General Practice

## 2022-10-18 ENCOUNTER — Ambulatory Visit (INDEPENDENT_AMBULATORY_CARE_PROVIDER_SITE_OTHER): Payer: Medicare Other

## 2022-10-18 VITALS — BP 120/60 | HR 85 | Ht 64.0 in | Wt 139.8 lb

## 2022-10-18 DIAGNOSIS — Z9889 Other specified postprocedural states: Secondary | ICD-10-CM

## 2022-10-18 DIAGNOSIS — E785 Hyperlipidemia, unspecified: Secondary | ICD-10-CM | POA: Diagnosis not present

## 2022-10-18 DIAGNOSIS — I639 Cerebral infarction, unspecified: Secondary | ICD-10-CM

## 2022-10-18 DIAGNOSIS — R002 Palpitations: Secondary | ICD-10-CM

## 2022-10-18 DIAGNOSIS — I10 Essential (primary) hypertension: Secondary | ICD-10-CM

## 2022-10-18 NOTE — Patient Instructions (Signed)
Medication Instructions:  The current medical regimen is effective;  continue present plan and medications as directed. Please refer to the Current Medication list given to you today.  *If you need a refill on your cardiac medications before your next appointment, please call your pharmacy*  Lab Work: [place] AND [facility] TODAY If you have labs (blood work) drawn today and your tests are completely normal, you will receive your results only by: [place] (if you have MyChart) OR A paper copy in the mail If you have any lab test that is abnormal or we need to change your treatment, we will call you to review the results.   Testing/Procedures: Your physician has requested you wear a ZIO patch monitor for 3 days, SEE BELOW   Follow-Up: At [facility], you and your health needs are our priority.  As part of our continuing mission to provide you with exceptional heart care, we have created designated Provider Care Teams.  These Care Teams include your primary Cardiologist (physician) and Advanced Practice Providers (APPs -  Physician Assistants and Nurse Practitioners) who all work together to provide you with the care you need, when you need it. Your next appointment:   8 week(s)  Provider:   Coletta Memos, FNP-C   Other Instructions ZIO XT- Long Term Monitor Instructions  Your physician has requested you wear a ZIO patch monitor for 3 days.  This is a single patch monitor. [employer] supplies one patch monitor per enrollment. Additional stickers are not available. Please do not apply patch if you will be having a Nuclear Stress Test,  Echocardiogram, Cardiac CT, MRI, or Chest Xray during the period you would be wearing the  monitor. The patch cannot be worn during these tests. You cannot remove and re-apply the  ZIO XT patch monitor.  Your ZIO patch monitor will be mailed 3 day USPS to your address on file. It may take 3-5 days  to receive your monitor after you have been  enrolled.  Once you have received your monitor, please review the enclosed instructions. Your monitor  has already been registered assigning a specific monitor serial # to you.  Billing and Patient Assistance Program Information  We have supplied [employer] with any of your insurance information on file for billing purposes. [employer] offers a sliding scale Patient Assistance Program for patients that do not have  insurance, or whose insurance does not completely cover the cost of the ZIO monitor.  You must apply for the Patient Assistance Program to qualify for this discounted rate.  To apply, please call [employer] at [phone], select option 4, select option 2, ask to apply for  Patient Assistance Program. Theodore Demark will ask your household income, and how many people  are in your household. They will quote your out-of-pocket cost based on that information.  [employer] will also be able to set up a 64-month interest-free payment plan if needed.  Applying the monitor   Shave hair from upper left chest.  Hold abrader disc by orange tab. Rub abrader in 40 strokes over the upper left chest as  indicated in your monitor instructions.  Clean area with 4 enclosed alcohol pads. Let dry.  Apply patch as indicated in monitor instructions. Patch will be placed under collarbone on left  side of chest with arrow pointing upward.  Rub patch adhesive wings for 2 minutes. Remove white label marked "1". Remove the white  label marked "2". Rub patch adhesive wings for 2 additional minutes.  While looking  in a mirror, press and release button in center of patch. A small green light will  flash 3-4 times. This will be your only indicator that the monitor has been turned on.  Do not shower for the first 24 hours. You may shower after the first 24 hours.  Press the button if you feel a symptom. You will hear a small click. Record Date, Time and  Symptom in the Patient Logbook.  When you are ready to remove the  patch, follow instructions on the last 2 pages of Patient  Logbook. Stick patch monitor onto the last page of Patient Logbook.  Place Patient Logbook in the blue and white box. Use locking tab on box and tape box closed  securely. The blue and white box has prepaid postage on it. Please place it in the mailbox as  soon as possible. Your physician should have your test results approximately 7 days after the  monitor has been mailed back to [facility].  Call [facility] at [phone] if you have questions regarding  your ZIO XT patch monitor. Call them immediately if you see an orange light blinking on your  monitor.  If your monitor falls off in less than 4 days, contact our Monitor department at [phone].  If your monitor becomes loose or falls off after 4 days call [employer] at [phone] for  suggestions on securing your monitor

## 2022-10-18 NOTE — Progress Notes (Unsigned)
Enrolled for [employer] to mail a ZIO XT long term holter monitor to the patients address on file.   Dr. Martinique to read.

## 2022-10-19 LAB — BASIC METABOLIC PANEL
BUN/Creatinine Ratio: 22 (ref 12–28)
BUN: 14 mg/dL (ref 8–27)
CO2: 22 mmol/L (ref 20–29)
Calcium: 9.6 mg/dL (ref 8.7–10.3)
Chloride: 105 mmol/L (ref 96–106)
Creatinine, Ser: 0.64 mg/dL (ref 0.57–1.00)
Glucose: 78 mg/dL (ref 70–99)
Potassium: 4.1 mmol/L (ref 3.5–5.2)
Sodium: 141 mmol/L (ref 134–144)
eGFR: 85 mL/min/{1.73_m2} (ref >59)

## 2022-10-19 LAB — CBC
Hematocrit: 41.6 % (ref 34.0–46.6)
Hemoglobin: 14.3 g/dL (ref 11.1–15.9)
MCH: 31.6 pg (ref 26.6–33.0)
MCHC: 34.4 g/dL (ref 31.5–35.7)
MCV: 92 fL (ref 79–97)
Platelets: 289 10*3/uL (ref 150–450)
RBC: 4.53 x10E6/uL (ref 3.77–5.28)
RDW: 11.8 % (ref 11.7–15.4)
WBC: 7.2 10*3/uL (ref 3.4–10.8)

## 2022-10-19 LAB — TSH: TSH: 1.93 u[IU]/mL (ref 0.450–4.500)

## 2022-10-19 LAB — MAGNESIUM: Magnesium: 2 mg/dL (ref 1.6–2.3)

## 2022-10-21 DIAGNOSIS — R002 Palpitations: Secondary | ICD-10-CM

## 2022-10-23 ENCOUNTER — Encounter: Payer: Self-pay | Admitting: Nurse Practitioner

## 2022-10-23 ENCOUNTER — Ambulatory Visit (INDEPENDENT_AMBULATORY_CARE_PROVIDER_SITE_OTHER): Payer: Medicare Other | Admitting: Nurse Practitioner

## 2022-10-23 VITALS — BP 129/65 | HR 66 | Temp 97.8°F | Resp 20 | Ht 64.0 in | Wt 138.0 lb

## 2022-10-23 DIAGNOSIS — M8588 Other specified disorders of bone density and structure, other site: Secondary | ICD-10-CM

## 2022-10-23 DIAGNOSIS — R739 Hyperglycemia, unspecified: Secondary | ICD-10-CM

## 2022-10-23 DIAGNOSIS — Z0001 Encounter for general adult medical examination with abnormal findings: Secondary | ICD-10-CM | POA: Diagnosis not present

## 2022-10-23 DIAGNOSIS — F411 Generalized anxiety disorder: Secondary | ICD-10-CM

## 2022-10-23 DIAGNOSIS — I6930 Unspecified sequelae of cerebral infarction: Secondary | ICD-10-CM

## 2022-10-23 DIAGNOSIS — E785 Hyperlipidemia, unspecified: Secondary | ICD-10-CM

## 2022-10-23 DIAGNOSIS — Z0000 Encounter for general adult medical examination without abnormal findings: Secondary | ICD-10-CM | POA: Diagnosis not present

## 2022-10-23 DIAGNOSIS — I10 Essential (primary) hypertension: Secondary | ICD-10-CM

## 2022-10-23 DIAGNOSIS — F321 Major depressive disorder, single episode, moderate: Secondary | ICD-10-CM

## 2022-10-23 DIAGNOSIS — G43919 Migraine, unspecified, intractable, without status migrainosus: Secondary | ICD-10-CM

## 2022-10-23 DIAGNOSIS — K219 Gastro-esophageal reflux disease without esophagitis: Secondary | ICD-10-CM

## 2022-10-23 MED ORDER — LOSARTAN POTASSIUM 100 MG PO TABS: 100.0000 mg | ORAL_TABLET | Freq: Every day | ORAL | 1 refills | Status: DC

## 2022-10-23 MED ORDER — PANTOPRAZOLE SODIUM 40 MG PO TBEC: 40.0000 mg | DELAYED_RELEASE_TABLET | Freq: Every day | ORAL | 1 refills | Status: DC

## 2022-10-23 MED ORDER — BUSPIRONE HCL 5 MG PO TABS: 5.0000 mg | ORAL_TABLET | Freq: Two times a day (BID) | ORAL | 5 refills | Status: DC | PRN

## 2022-10-23 MED ORDER — ROSUVASTATIN CALCIUM 20 MG PO TABS: 20.0000 mg | ORAL_TABLET | Freq: Every day | ORAL | 1 refills | Status: DC

## 2022-10-23 MED ORDER — ESCITALOPRAM OXALATE 20 MG PO TABS: 20.0000 mg | ORAL_TABLET | Freq: Every day | ORAL | 1 refills | Status: DC

## 2022-10-23 MED ORDER — AMLODIPINE BESYLATE 2.5 MG PO TABS: 2.5000 mg | ORAL_TABLET | Freq: Every evening | ORAL | 1 refills | Status: DC

## 2022-10-23 NOTE — Patient Instructions (Signed)
Fall Prevention in the Home, Adult Falls can cause injuries and can happen to people of all ages. There are many things you can do to make your home safer and to help prevent falls. What actions can I take to prevent falls? General information Use good lighting in all rooms. Make sure to: Replace any light bulbs that burn out. Turn on the lights in dark areas and use night-lights. Keep items that you use often in easy-to-reach places. Lower the shelves around your home if needed. Move furniture so that there are clear paths around it. Do not use throw rugs or other things on the floor that can make you trip. If any of your floors are uneven, fix them. Add color or contrast paint or tape to clearly mark and help you see: Grab bars or handrails. First and last steps of staircases. Where the edge of each step is. If you use a ladder or stepladder: Make sure that it is fully opened. Do not climb a closed ladder. Make sure the sides of the ladder are locked in place. Have someone hold the ladder while you use it. Know where your pets are as you move through your home. What can I do in the bathroom?     Keep the floor dry. Clean up any water on the floor right away. Remove soap buildup in the bathtub or shower. Buildup makes bathtubs and showers slippery. Use non-skid mats or decals on the floor of the bathtub or shower. Attach bath mats securely with double-sided, non-slip rug tape. If you need to sit down in the shower, use a non-slip stool. Install grab bars by the toilet and in the bathtub and shower. Do not use towel bars as grab bars. What can I do in the bedroom? Make sure that you have a light by your bed that is easy to reach. Do not use any sheets or blankets on your bed that hang to the floor. Have a firm chair or bench with side arms that you can use for support when you get dressed. What can I do in the kitchen? Clean up any spills right away. If you need to reach something  above you, use a step stool with a grab bar. Keep electrical cords out of the way. Do not use floor polish or wax that makes floors slippery. What can I do with my stairs? Do not leave anything on the stairs. Make sure that you have a light switch at the top and the bottom of the stairs. Make sure that there are handrails on both sides of the stairs. Fix handrails that are broken or loose. Install non-slip stair treads on all your stairs if they do not have carpet. Avoid having throw rugs at the top or bottom of the stairs. Choose a carpet that does not hide the edge of the steps on the stairs. Make sure that the carpet is firmly attached to the stairs. Fix carpet that is loose or worn. What can I do on the outside of my home? Use bright outdoor lighting. Fix the edges of walkways and driveways and fix any cracks. Clear paths of anything that can make you trip, such as tools or rocks. Add color or contrast paint or tape to clearly mark and help you see anything that might make you trip as you walk through a door, such as a raised step or threshold. Trim any bushes or trees on paths to your home. Check to see if handrails are loose  or broken and that both sides of all steps have handrails. Install guardrails along the edges of any raised decks and porches. Have leaves, snow, or ice cleared regularly. Use sand, salt, or ice melter on paths if you live where there is ice and snow during the winter. Clean up any spills in your garage right away. This includes grease or oil spills. What other actions can I take? Review your medicines with your doctor. Some medicines can cause dizziness or changes in blood pressure, which increase your risk of falling. Wear shoes that: Have a low heel. Do not wear high heels. Have rubber bottoms and are closed at the toe. Feel good on your feet and fit well. Use tools that help you move around if needed. These include: Canes. Walkers. Scooters. Crutches. Ask  your doctor what else you can do to help prevent falls. This may include seeing a physical therapist to learn to do exercises to move better and get stronger. Where to find more information Centers for Disease Control and Prevention, [employer]: StoreMirror.com.cy [employer] on Aging: AquariamTheater.co.nz National Institute on Aging: AquariamTheater.co.nz Contact a doctor if: You are afraid of falling at home. You feel weak, drowsy, or dizzy at home. You fall at home. Get help right away if you: Lose consciousness or have trouble moving after a fall. Have a fall that causes a head injury. These symptoms may be an emergency. Get help right away. Call 911. Do not wait to see if the symptoms will go away. Do not drive yourself to the hospital. This information is not intended to replace advice given to you by your health care provider. Make sure you discuss any questions you have with your health care provider. Document Revised: 04/17/2022 Document Reviewed: 04/17/2022 Elsevier Patient Education  [place].

## 2022-10-23 NOTE — Progress Notes (Signed)
Subjective:    Patient ID: Christina Silva, female    DOB: 1932/10/05, 89 y.o.   MRN: [id number]   Chief Complaint:  annual physical  HPI:  Christina Silva is a 89 y.o. who identifies as a female who was assigned female at birth.   Social history: Lives with: by herself Work history: retired   Scientist, forensic in today for follow up of the following chronic medical issues:  1. Primary hypertension No c/o chest pain, sob or headache. Does not check blood pressure at home. BP Readings from Last 3 Encounters:  10/18/22 120/60  07/28/22 126/66  06/08/22 138/70     2. Hyperlipidemia with target LDL less than 100 Does try to watch diet and stays very active. Lab Results  Component Value Date   CHOL 153 04/21/2022   HDL 58 04/21/2022   LDLCALC 77 04/21/2022   TRIG 95 04/21/2022   CHOLHDL 2.6 04/21/2022     3. Hyperglycemia Does not check blood sugars at home. Lab Results  Component Value Date   HGBA1C 5.6 02/09/2021     4. Refractory migraine with aura Have not had any in over a year  5. Gastroesophageal reflux disease without esophagitis Takes protonix daily to prevent symptoms.  6. Anxiety state Is on buspar BID    10/23/2022   10:07 AM 07/28/2022   11:17 AM 04/21/2022   11:45 AM 01/31/2022   12:12 PM  GAD 7 : Generalized Anxiety Score  Nervous, Anxious, on Edge 1 1 0 1  Control/stop worrying 1 1 0 1  Worry too much - different things '1 1 1 1  '$ Trouble relaxing 0 1 0 0  Restless 0 0 0 0  Easily annoyed or irritable 0 0 0 0  Afraid - awful might happen 0 1 0 0  Total GAD 7 Score '3 5 1 3  '$ Anxiety Difficulty Somewhat difficult Not difficult at all Not difficult at all Not difficult at all      7. Depression, major, single episode, moderate ([place]) Is on lexapro and is doing well.    10/23/2022   10:07 AM 07/28/2022   11:16 AM 07/10/2022    2:21 PM  Depression screen PHQ 2/9  Decreased Interest 0 0 0  Down, Depressed, Hopeless 0 0 0  PHQ - 2 Score 0 0 0   Altered sleeping 0 0   Tired, decreased energy 1 1   Change in appetite 0 0   Feeling bad or failure about yourself  0 0   Trouble concentrating 0 0   Moving slowly or fidgety/restless 0 0   Suicidal thoughts 0 0   PHQ-9 Score 1 1   Difficult doing work/chores Somewhat difficult Not difficult at all      8. Late effect of cerebrovascular accident (CVA) No permanent effects  9. Osteopenia of spine No weight bearing exercises. Last dexascan was done in 2016. Declines repeat.   New complaints: Saw cardiologist last Wednesday and they put her on zio for irregular heart beat.  Allergies  Allergen Reactions   Codeine Nausea And Vomiting   Lisinopril Swelling   Morphine Nausea And Vomiting   Other    Sulfa Antibiotics Nausea Only and Rash   Sulfonamide Derivatives Nausea Only and Rash   Outpatient Encounter Medications as of 10/23/2022  Medication Sig   amLODipine (NORVASC) 2.5 MG tablet Take 1 tablet (2.5 mg total) by mouth every evening.   aspirin EC 81 MG tablet Take 81 mg by mouth  daily. Swallow whole.   busPIRone (BUSPAR) 5 MG tablet TAKE ONE TABLET BY MOUTH TWICE DAILY AS NEEDED   cholecalciferol (VITAMIN D) 25 MCG (1000 UNIT) tablet Take 2,000 Units by mouth daily.   escitalopram (LEXAPRO) 20 MG tablet Take 1 tablet (20 mg total) by mouth daily.   losartan (COZAAR) 100 MG tablet TAKE ONE (1) TABLET BY MOUTH EVERY DAY   megestrol (MEGACE) 20 MG tablet TAKE ONE (1) TABLET BY MOUTH EVERY DAY   pantoprazole (PROTONIX) 40 MG tablet Take 1 tablet (40 mg total) by mouth daily.   rosuvastatin (CRESTOR) 20 MG tablet Take 1 tablet (20 mg total) by mouth daily.   triamcinolone ointment (KENALOG) 0.5 % Apply 1 application. topically 2 (two) times daily.   No facility-administered encounter medications on file as of 10/23/2022.    Past Surgical History:  Procedure Laterality Date   ABDOMINAL HYSTERECTOMY  1980   CATARACT EXTRACTION Right    CHOLECYSTECTOMY N/A 12/16/2019    Procedure: LAPAROSCOPIC CHOLECYSTECTOMY WITH INTRAOPERATIVE CHOLANGIOGRAM;  Surgeon: Erroll Luna, MD;  Location: [facility];  Service: General;  Laterality: N/A;   EYE SURGERY     implantable loop recorder placement  11/12/2020    Medtronic Reveal Linq model [id number] implantable loop recorder (SN [id number]) implanted by Dr Rayann Heman for cryptogenic stroke   KNEE ARTHROSCOPY Left    ROTATOR CUFF REPAIR Left    SQUAMOUS CELL CARCINOMA EXCISION Left    TONSILLECTOMY      Family History  Problem Relation Age of Onset   Heart attack Mother    Hyperlipidemia Mother    Heart disease Mother    Transient ischemic attack Father    Stroke Father    Psychiatric Illness Sister    Heart disease Sister    Arthritis Brother    Epilepsy Brother    Colon cancer Neg Hx    Esophageal cancer Neg Hx    Rectal cancer Neg Hx    Stomach cancer Neg Hx    Pancreatic cancer Neg Hx       Controlled substance contract: n/a     Review of Systems  Constitutional:  Negative for diaphoresis.  Eyes:  Negative for pain.  Respiratory:  Negative for shortness of breath.   Cardiovascular:  Negative for chest pain, palpitations and leg swelling.  Gastrointestinal:  Negative for abdominal pain.  Endocrine: Negative for polydipsia.  Skin:  Negative for rash.  Neurological:  Negative for dizziness, weakness and headaches.  Hematological:  Does not bruise/bleed easily.  All other systems reviewed and are negative.      Objective:   Physical Exam Vitals and nursing note reviewed.  Constitutional:      General: She is not in acute distress.    Appearance: Normal appearance. She is well-developed.  HENT:     Head: Normocephalic.     Right Ear: Tympanic membrane normal.     Left Ear: Tympanic membrane normal.     Nose: Nose normal.     Mouth/Throat:     Mouth: Mucous membranes are moist.  Eyes:     Pupils: Pupils are equal, round, and reactive to light.  Neck:     Vascular: No carotid bruit or JVD.   Cardiovascular:     Rate and Rhythm: Normal rate and regular rhythm.     Heart sounds: Normal heart sounds.     Comments: Zio in place Pulmonary:     Effort: Pulmonary effort is normal. No respiratory distress.     Breath sounds:  Normal breath sounds. No wheezing or rales.  Chest:     Chest wall: No tenderness.  Abdominal:     General: Bowel sounds are normal. There is no distension or abdominal bruit.     Palpations: Abdomen is soft. There is no hepatomegaly, splenomegaly, mass or pulsatile mass.     Tenderness: There is no abdominal tenderness.  Musculoskeletal:        General: Normal range of motion.     Cervical back: Normal range of motion and neck supple.  Lymphadenopathy:     Cervical: No cervical adenopathy.  Skin:    General: Skin is warm and dry.  Neurological:     Mental Status: She is alert and oriented to person, place, and time.     Deep Tendon Reflexes: Reflexes are normal and symmetric.  Psychiatric:        Behavior: Behavior normal.        Thought Content: Thought content normal.        Judgment: Judgment normal.    BP 129/65   Pulse 66   Temp 97.8 F (36.6 C) (Temporal)   Resp 20   Ht '5\' 4"'$  (1.626 m)   Wt 138 lb (62.6 kg)   SpO2 97%   BMI 23.69 kg/m         Assessment & Plan:   Christina Silva comes in today with chief complaint of Annual Exam   Diagnosis and orders addressed:  1. Annual physical exam  2. Primary hypertension Low sodium diet - amLODipine (NORVASC) 2.5 MG tablet; Take 1 tablet (2.5 mg total) by mouth every evening.  Dispense: 90 tablet; Refill: 1 - losartan (COZAAR) 100 MG tablet; Take 1 tablet (100 mg total) by mouth daily.  Dispense: 90 tablet; Refill: 1  3. Hyperlipidemia with target LDL less than 100 Low fat diet - rosuvastatin (CRESTOR) 20 MG tablet; Take 1 tablet (20 mg total) by mouth daily.  Dispense: 90 tablet; Refill: 1  4. Hyperglycemia Watch carbs in diet  5. Refractory migraine with aura Avoid  caffeine  6. Gastroesophageal reflux disease without esophagitis Avoid spicy foods Do not eat 2 hours prior to bedtime - pantoprazole (PROTONIX) 40 MG tablet; Take 1 tablet (40 mg total) by mouth daily.  Dispense: 90 tablet; Refill: 1  7. Anxiety state Stress management - busPIRone (BUSPAR) 5 MG tablet; Take 1 tablet (5 mg total) by mouth 2 (two) times daily as needed.  Dispense: 60 tablet; Refill: 5 8. Depression, major, single episode, moderate ([facility]) - escitalopram (LEXAPRO) 20 MG tablet; Take 1 tablet (20 mg total) by mouth daily.  Dispense: 90 tablet; Refill: 1  9. Late effect of cerebrovascular accident (CVA) Fall prevention  10. Osteopenia of spine Weight bearing exercises  Labs pending Health Maintenance reviewed Diet and exercise encouraged  Follow up plan: 6 months   [place], FNP

## 2022-10-23 NOTE — Addendum Note (Signed)
Addended by: [name] on: 10/23/2022 10:20 AM   Modules accepted: Orders

## 2022-10-24 LAB — THYROID PANEL WITH TSH
Free Thyroxine Index: 2.3 (ref 1.2–4.9)
T3 Uptake Ratio: 25 % (ref 24–39)
T4, Total: 9 ug/dL (ref 4.5–12.0)
TSH: 1.29 u[IU]/mL (ref 0.450–4.500)

## 2022-10-24 LAB — CBC WITH DIFFERENTIAL/PLATELET
Basophils Absolute: 0 10*3/uL (ref 0.0–0.2)
Basos: 1 %
EOS (ABSOLUTE): 0.2 10*3/uL (ref 0.0–0.4)
Eos: 3 %
Hematocrit: 41 % (ref 34.0–46.6)
Hemoglobin: 14.2 g/dL (ref 11.1–15.9)
Immature Grans (Abs): 0 10*3/uL (ref 0.0–0.1)
Immature Granulocytes: 0 %
Lymphocytes Absolute: 1.8 10*3/uL (ref 0.7–3.1)
Lymphs: 29 %
MCH: 31.8 pg (ref 26.6–33.0)
MCHC: 34.6 g/dL (ref 31.5–35.7)
MCV: 92 fL (ref 79–97)
Monocytes Absolute: 0.5 10*3/uL (ref 0.1–0.9)
Monocytes: 8 %
Neutrophils Absolute: 3.8 10*3/uL (ref 1.4–7.0)
Neutrophils: 59 %
Platelets: 270 10*3/uL (ref 150–450)
RBC: 4.47 x10E6/uL (ref 3.77–5.28)
RDW: 11.8 % (ref 11.7–15.4)
WBC: 6.3 10*3/uL (ref 3.4–10.8)

## 2022-10-24 LAB — CMP14+EGFR
ALT: 19 IU/L (ref 0–32)
AST: 19 IU/L (ref 0–40)
Albumin/Globulin Ratio: 2.2 (ref 1.2–2.2)
Albumin: 4.4 g/dL (ref 3.7–4.7)
Alkaline Phosphatase: 61 IU/L (ref 44–121)
BUN/Creatinine Ratio: 14 (ref 12–28)
BUN: 10 mg/dL (ref 8–27)
Bilirubin Total: 0.7 mg/dL (ref 0.0–1.2)
CO2: 19 mmol/L — ABNORMAL LOW (ref 20–29)
Calcium: 9.7 mg/dL (ref 8.7–10.3)
Chloride: 106 mmol/L (ref 96–106)
Creatinine, Ser: 0.7 mg/dL (ref 0.57–1.00)
Globulin, Total: 2 g/dL (ref 1.5–4.5)
Glucose: 92 mg/dL (ref 70–99)
Potassium: 4 mmol/L (ref 3.5–5.2)
Sodium: 142 mmol/L (ref 134–144)
Total Protein: 6.4 g/dL (ref 6.0–8.5)
eGFR: 84 mL/min/{1.73_m2} (ref >59)

## 2022-10-24 LAB — LIPID PANEL
Chol/HDL Ratio: 2.8 ratio (ref 0.0–4.4)
Cholesterol, Total: 149 mg/dL (ref 100–199)
HDL: 54 mg/dL (ref >39)
LDL Chol Calc (NIH): 76 mg/dL (ref 0–99)
Triglycerides: 106 mg/dL (ref 0–149)
VLDL Cholesterol Cal: 19 mg/dL (ref 5–40)

## 2022-10-27 ENCOUNTER — Telehealth: Payer: Self-pay | Admitting: Nurse Practitioner

## 2022-10-27 NOTE — Telephone Encounter (Signed)
Spoke with patient and gave her requested lab results and mailed patient a copy. Patient verbalized understanding

## 2022-11-04 DIAGNOSIS — R002 Palpitations: Secondary | ICD-10-CM | POA: Diagnosis not present

## 2022-11-22 ENCOUNTER — Other Ambulatory Visit: Payer: Self-pay | Admitting: Nurse Practitioner

## 2022-11-22 DIAGNOSIS — E785 Hyperlipidemia, unspecified: Secondary | ICD-10-CM

## 2022-12-11 NOTE — Progress Notes (Unsigned)
Cardiology Clinic Note   Patient Name: Christina Silva Date of Encounter: 12/13/2022  Primary Care Provider:  Bennie Pierini, FNP Primary Cardiologist:  Peter Swaziland, MD  Patient Profile     Christina Silva 89 year old female presents the clinic today for follow-up evaluation of her hypertension and hyperlipidemia.  Past Medical History    Past Medical History:  Diagnosis Date   Anxiety    Cataract    GERD (gastroesophageal reflux disease)    Hyperlipidemia    Hypertension    SCCA (squamous cell carcinoma) of skin 11/02/2020   Mid Chest (in situ) (tx p bx)   SCCA (squamous cell carcinoma) of skin 11/02/2020   Right Forearm Posterior (in situ) (tx p bx)   SCCA (squamous cell carcinoma) of skin 11/02/2020   Left Submandibular Area (in situ) (tx p bx)   Stroke 09/2018   Vertigo    patient denies   Past Surgical History:  Procedure Laterality Date   ABDOMINAL HYSTERECTOMY  1980   CATARACT EXTRACTION Right    CHOLECYSTECTOMY N/A 12/16/2019   Procedure: LAPAROSCOPIC CHOLECYSTECTOMY WITH INTRAOPERATIVE CHOLANGIOGRAM;  Surgeon: Harriette Bouillon, MD;  Location: [facility] OR;  Service: General;  Laterality: N/A;   EYE SURGERY     implantable loop recorder placement  11/12/2020    Medtronic Reveal Linq model [id number] implantable loop recorder (SN [id number]) implanted by Dr Johney Frame for cryptogenic stroke   KNEE ARTHROSCOPY Left    ROTATOR CUFF REPAIR Left    SQUAMOUS CELL CARCINOMA EXCISION Left    TONSILLECTOMY      Allergies  Allergies  Allergen Reactions   Codeine Nausea And Vomiting   Lisinopril Swelling   Morphine Nausea And Vomiting   Other    Sulfa Antibiotics Nausea Only and Rash   Sulfonamide Derivatives Nausea Only and Rash    History of Present Illness    Christina Silva has a PMH of hypertension, refractory migraine, GERD, osteopenia, hyperlipidemia, anxiety, hyperglycemia, CVA, and depression.  She is status post loop recorder implant.  She  initially established care 5/19 for blood pressure management and lower extremity edema.  Her echocardiogram showed an EF of 55 to 60%, G1 DD, mild AI, mild-moderate MR and TR.  2/20 she suffered a cryptogenic CVA and was placed on aspirin and Plavix for 1 month.  She was then maintained on aspirin alone.  She had a TIA 7/20 and Plavix was restarted.  This was again discontinued by Dr. Pearlean Brownie.  She had brain MRI 1/22 which showed new right posterior cerebral artery occlusion.  At that time she received implantable loop recorder.  She was seen and evaluated by Dr. Johney Frame 12/01/2021.  At that time she was doing well from a cardiac standpoint.  She had concerns about continued remote monitoring related to ILD device.  Ultimate decision was to leave the recorder in place and turn off the remote transmission feature.  She was advised that she could asked Dr. Swaziland to interrogate her recorder at future visits.  She was seen in follow-up by Wallis Bamberg, NP on 10/18/2022.  She presented with her daughter and her niece.  She was doing well from a cardiac standpoint.  She noted intermittent palpitations.  She denied accompanying symptoms.  She did note that the palpitations made her anxious.  Her blood pressure was well-controlled.  She remains independent and very active.  She denied chest pain and shortness of breath.  A 3-day cardiac event monitor was ordered.  It showed episodes of  SVT.  Her amlodipine was discontinued and she was started on diltiazem 120 mg daily.  She presents to the clinic today for follow-up evaluation and states she has had less frequent episodes of palpitations since being seen in the clinic last.  We reviewed her cardiac event monitor.  She expressed her standing.  We reviewed triggers for palpitations and vagal maneuvers.  We reviewed options for medication.  She did not start diltiazem because she wanted to review her cardiac event monitor and discussed the medication.  At this time she  wishes to avoid triggers and will contact the office if she has more episodes of palpitations or worsening palpitations.  Her lab work was unremarkable.  We will plan follow-up in 6 months.  Today she denies chest pain, shortness of breath, lower extremity edema, fatigue, palpitations, melena, hematuria, hemoptysis, diaphoresis, weakness, presyncope, syncope, orthopnea, and PND.   Home Medications    Prior to Admission medications   Medication Sig Start Date End Date Taking? Authorizing Provider  amLODipine (NORVASC) 2.5 MG tablet Take 1 tablet (2.5 mg total) by mouth every evening. 10/23/22   Daphine Deutscher Mary-Margaret, FNP  aspirin EC 81 MG tablet Take 81 mg by mouth daily. Swallow whole.    [provider]  busPIRone (BUSPAR) 5 MG tablet Take 1 tablet (5 mg total) by mouth 2 (two) times daily as needed. 10/23/22   Daphine Deutscher Mary-Margaret, FNP  cholecalciferol (VITAMIN D) 25 MCG (1000 UNIT) tablet Take 2,000 Units by mouth daily.    [provider]  escitalopram (LEXAPRO) 20 MG tablet Take 1 tablet (20 mg total) by mouth daily. 10/23/22   Daphine Deutscher, Mary-Margaret, FNP  losartan (COZAAR) 100 MG tablet Take 1 tablet (100 mg total) by mouth daily. 10/23/22   Daphine Deutscher Mary-Margaret, FNP  megestrol (MEGACE) 20 MG tablet TAKE ONE (1) TABLET BY MOUTH EVERY DAY 04/21/22   Daphine Deutscher, Mary-Margaret, FNP  pantoprazole (PROTONIX) 40 MG tablet Take 1 tablet (40 mg total) by mouth daily. 10/23/22   Daphine Deutscher Mary-Margaret, FNP  rosuvastatin (CRESTOR) 20 MG tablet TAKE ONE (1) TABLET BY MOUTH EVERY DAY 11/22/22   Bennie Pierini, FNP    Family History    Family History  Problem Relation Age of Onset   Heart attack Mother    Hyperlipidemia Mother    Heart disease Mother    Transient ischemic attack Father    Stroke Father    Psychiatric Illness Sister    Heart disease Sister    Arthritis Brother    Epilepsy Brother    Colon cancer Neg Hx    Esophageal cancer Neg Hx    Rectal cancer Neg Hx     Stomach cancer Neg Hx    Pancreatic cancer Neg Hx    She indicated that her mother is deceased. She indicated that her father is deceased. She indicated that her sister is deceased. She indicated that only one of her five brothers is alive. She indicated that her maternal grandmother is deceased. She indicated that her maternal grandfather is deceased. She indicated that her paternal grandmother is deceased. She indicated that her paternal grandfather is deceased. She indicated that the status of her neg hx is unknown.  Social History    Social History   Socioeconomic History   Marital status: Widowed    Spouse name: Not on file   Number of children: 2   Years of education: 10   Highest education level: Not on file  Occupational History   Occupation: retired  Tobacco Use  Smoking status: Former    Types: Cigarettes    Quit date: 01/09/1983    Years since quitting: 39.9   Smokeless tobacco: Never  Vaping Use   Vaping Use: Never used  Substance and Sexual Activity   Alcohol use: No   Drug use: No   Sexual activity: Yes  Other Topics Concern   Not on file  Social History Narrative   Lives alone handicap accessible home - daughter in [place] and son in [place].   Right Handed   Drinks caffeine occassionally   Social Determinants of Health   Financial Resource Strain: Low Risk  (07/10/2022)   Overall Financial Resource Strain (CARDIA)    Difficulty of Paying Living Expenses: Not hard at all  Food Insecurity: No Food Insecurity (07/10/2022)   Hunger Vital Sign    Worried About Running Out of Food in the Last Year: Never true    Ran Out of Food in the Last Year: Never true  Transportation Needs: No Transportation Needs (07/10/2022)   PRAPARE - Administrator, Civil Service (Medical): No    Lack of Transportation (Non-Medical): No  Physical Activity: Insufficiently Active (07/10/2022)   Exercise Vital Sign    Days of Exercise per Week: 3 days    Minutes  of Exercise per Session: 30 min  Stress: No Stress Concern Present (07/10/2022)   [employer] of Occupational Health - Occupational Stress Questionnaire    Feeling of Stress : Only a little  Social Connections: Moderately Isolated (07/10/2022)   Social Connection and Isolation Panel [NHANES]    Frequency of Communication with Friends and Family: More than three times a week    Frequency of Social Gatherings with Friends and Family: More than three times a week    Attends Religious Services: 1 to 4 times per year    Active Member of [employer] or Organizations: No    Attends Banker Meetings: Never    Marital Status: Widowed  Intimate Partner Violence: Not At Risk (07/10/2022)   Humiliation, Afraid, Rape, and Kick questionnaire    Fear of Current or Ex-Partner: No    Emotionally Abused: No    Physically Abused: No    Sexually Abused: No     Review of Systems    General:  No chills, fever, night sweats or weight changes.  Cardiovascular:  No chest pain, dyspnea on exertion, edema, orthopnea, palpitations, paroxysmal nocturnal dyspnea. Dermatological: No rash, lesions/masses Respiratory: No cough, dyspnea Urologic: No hematuria, dysuria Abdominal:   No nausea, vomiting, diarrhea, bright red blood per rectum, melena, or hematemesis Neurologic:  No visual changes, wkns, changes in mental status. All other systems reviewed and are otherwise negative except as noted above.  Physical Exam    VS:  BP 120/60   Pulse 88   Ht 5\' 4"  (1.626 m)   Wt 143 lb 6.4 oz (65 kg)   SpO2 92%   BMI 24.61 kg/m  , BMI Body mass index is 24.61 kg/m. GEN: Well nourished, well developed, in no acute distress. HEENT: normal. Neck: Supple, no JVD, carotid bruits, or masses. Cardiac: RRR, no murmurs, rubs, or gallops. No clubbing, cyanosis, edema.  Radials/DP/PT 2+ and equal bilaterally.  Respiratory:  Respirations regular and unlabored, clear to auscultation bilaterally. GI: Soft,  nontender, nondistended, BS + x 4. MS: no deformity or atrophy. Skin: warm and dry, no rash. Neuro:  Strength and sensation are intact. Psych: Normal affect.  Accessory Clinical Findings    Recent Labs: 10/18/2022:  Magnesium 2.0 10/23/2022: ALT 19; BUN 10; Creatinine, Ser 0.70; Hemoglobin 14.2; Platelets 270; Potassium 4.0; Sodium 142; TSH 1.290   Recent Lipid Panel    Component Value Date/Time   CHOL 149 10/23/2022 1024   TRIG 106 10/23/2022 1024   TRIG 149 02/26/2014 0930   HDL 54 10/23/2022 1024   HDL 68 02/26/2014 0930   CHOLHDL 2.8 10/23/2022 1024   CHOLHDL 3.6 10/18/2018 0625   VLDL 27 10/18/2018 0625   LDLCALC 76 10/23/2022 1024   LDLCALC 144 (H) 02/26/2014 0930         ECG personally reviewed by me today-none today.  Echocardiogram 10/18/2018 IMPRESSIONS     1. The left ventricle has normal systolic function with an ejection  fraction of 60-65%. The cavity size was normal. There is basal septal  hypertophy. Left ventricular diastolic Doppler parameters are consistent  with impaired relaxation.   2. The right ventricle has normal systolic function. The cavity was  normal. There is no increase in right ventricular wall thickness.   3. The aortic valve is tricuspid Moderate thickening of the aortic valve  Moderate calcification of the aortic valve. Aortic valve regurgitation is  mild by color flow Doppler.. Moderate aortic annular calcification noted.   4. The mitral valve is normal in structure. Mild thickening of the mitral  valve leaflet. Mild calcification of the mitral valve leaflet. There is  mild mitral annular calcification present. No evidence of mitral valve  stenosis.   5. The tricuspid valve is normal in structure.   6. The pulmonic valve was grossly normal. Pulmonic valve regurgitation is  mild by color flow Doppler.   7. The aortic root is normal in size and structure.   8. Pulmonary hypertension is indeterminat,inadequate TR jet.   FINDINGS    Left Ventricle: The left ventricle has normal systolic function, with an  ejection fraction of 60-65%. The cavity size was normal. There is basal  septal hypertophy. Left ventricular diastolic Doppler parameters are  consistent with impaired relaxation  Right Ventricle: The right ventricle has normal systolic function. The  cavity was normal. There is no increase in right ventricular wall  thickness.  Left Atrium: left atrial size was normal in size  Right Atrium: right atrial size was normal in size. Right atrial pressure  is estimated at 3 mmHg.  Interatrial Septum: No atrial level shunt detected by color flow Doppler.  Pericardium: There is no evidence of pericardial effusion.  Mitral Valve: The mitral valve is normal in structure. Mild thickening of  the mitral valve leaflet. Mild calcification of the mitral valve leaflet.  There is mild mitral annular calcification present. Mitral valve  regurgitation is mild by color flow  Doppler. No evidence of mitral valve stenosis.  Tricuspid Valve: The tricuspid valve is normal in structure. Tricuspid  valve regurgitation is trivial by color flow Doppler.  Aortic Valve: The aortic valve is tricuspid Moderate thickening of the  aortic valve Moderate calcification of the aortic valve. Aortic valve  regurgitation is mild by color flow Doppler. There is no evidence of  aortic valve stenosis. Moderate aortic  annular calcification noted.  Pulmonic Valve: The pulmonic valve was grossly normal. Pulmonic valve  regurgitation is mild by color flow Doppler. No evidence of pulmonic  stenosis.  Aorta: The aortic root is normal in size and structure.  Pulmonary Artery: Pulmonary hypertension is indeterminat,inadequate TR  jet.  Venous: The inferior vena cava is normal in size with greater than 50%  respiratory variability.  Cardiac event monitor 11/06/2022   Normal sinus rhythm   Several brief runs of nonsustained SVT - longest lasting 15  seconds.   Otherwise rare ectopy     Patch Wear Time:  3 days and 0 hours (2024-02-24T10:21:30-0500 to 2024-02-27T10:34:09-498)   Patient had a min HR of 46 bpm, max HR of 207 bpm, and avg HR of 71 bpm. Predominant underlying rhythm was Sinus Rhythm. 30 Supraventricular Tachycardia runs occurred, the run with the fastest interval lasting 8 beats with a max rate of 207 bpm, the  longest lasting 15.3 secs with an avg rate of 122 bpm. Isolated SVEs were rare (<1.0%), SVE Couplets were rare (<1.0%), and SVE Triplets were rare (<1.0%). Isolated VEs were rare (<1.0%, 431), VE Couplets were rare (<1.0%, 1), and VE Triplets were rare  (<1.0%, 2).   Assessment & Plan   1.  Palpitations-reports less episodes of palpitations since being seen in the clinic last.  Has not started diltiazem.  Recent cardiac event monitor showed episodes of SVT.  Details above.  February labs stable.  Instructed to contact clinic if worsening or more pronounced episodes occur.  She wishes to continue current medication regimen at this time. Avoid triggers caffeine, chocolate, EtOH, dehydration etc. Vagal maneuvers reviewed Increase physical activity as tolerated  Essential hypertension-BP today 120/60 Continue amlodipine, losartan Maintain blood pressure log Heart healthy low-sodium diet  Hyperlipidemia-LDL 76 on 10/23/22. Continue rosuvastatin Maintain physical activity  Status post CVA-is status post loop recorder placement.  Device was turned off secondary to cost. Follows with the EP  Disposition: Follow-up with Dr. Swaziland or me in 6 months.   Thomasene Ripple. Maisha Bogen NP-C     12/13/2022, 1:49 PM [facility] [address] Office [phone] Fax [phone]    I spent 13 minutes examining this patient, reviewing medications, and using patient centered shared decision making involving her cardiac care.  Prior to her visit I spent greater than 20 minutes reviewing her past  medical history,  medications, and prior cardiac tests.

## 2022-12-13 ENCOUNTER — Encounter: Payer: Self-pay | Admitting: General Practice

## 2022-12-13 ENCOUNTER — Ambulatory Visit: Payer: Medicare Other | Attending: General Practice | Admitting: General Practice

## 2022-12-13 VITALS — BP 120/60 | HR 88 | Ht 64.0 in | Wt 143.4 lb

## 2022-12-13 DIAGNOSIS — R002 Palpitations: Secondary | ICD-10-CM | POA: Diagnosis not present

## 2022-12-13 DIAGNOSIS — E785 Hyperlipidemia, unspecified: Secondary | ICD-10-CM | POA: Diagnosis not present

## 2022-12-13 DIAGNOSIS — Z8673 Personal history of transient ischemic attack (TIA), and cerebral infarction without residual deficits: Secondary | ICD-10-CM | POA: Diagnosis not present

## 2022-12-13 DIAGNOSIS — I10 Essential (primary) hypertension: Secondary | ICD-10-CM | POA: Diagnosis not present

## 2022-12-13 NOTE — Patient Instructions (Signed)
Medication Instructions:  The current medical regimen is effective;  continue present plan and medications as directed. Please refer to the Current Medication list given to you today.  *If you need a refill on your cardiac medications before your next appointment, please call your pharmacy*  Lab Work: NONE If you have labs (blood work) drawn today and your tests are completely normal, you will receive your results only by:  MyChart Message (if you have MyChart) OR  A paper copy in the mail If you have any lab test that is abnormal or we need to change your treatment, we will call you to review the results.  Testing/Procedures: NONE  Follow-Up: At [facility], you and your health needs are our priority.  As part of our continuing mission to provide you with exceptional heart care, we have created designated Provider Care Teams.  These Care Teams include your primary Cardiologist (physician) and Advanced Practice Providers (APPs -  Physician Assistants and Nurse Practitioners) who all work together to provide you with the care you need, when you need it.  Your next appointment:   6 month(s)  Provider:   Peter Swaziland, MD  or Edd Fabian, FNP        Other Instructions Vagal manoeuvres include: 1-Bearing down. Bearing down means that you try to breathe out with your stomach muscles but you don't let air out of your nose or mouth. 2-Putting an ice-cold, wet towel on your face. 3-Coughing or gagging.  4-Valsalva maneuver

## 2023-01-11 ENCOUNTER — Emergency Department (HOSPITAL_COMMUNITY): Payer: Medicare Other

## 2023-01-11 ENCOUNTER — Emergency Department (HOSPITAL_COMMUNITY)
Admission: EM | Admit: 2023-01-11 | Discharge: 2023-01-11 | Disposition: A | Discharge status: Home / Self Care | Payer: Medicare Other | Attending: Emergency Medicine | Admitting: Emergency Medicine

## 2023-01-11 ENCOUNTER — Other Ambulatory Visit: Payer: Self-pay

## 2023-01-11 DIAGNOSIS — R42 Dizziness and giddiness: Secondary | ICD-10-CM | POA: Diagnosis not present

## 2023-01-11 DIAGNOSIS — I10 Essential (primary) hypertension: Secondary | ICD-10-CM | POA: Diagnosis not present

## 2023-01-11 DIAGNOSIS — S0990XA Unspecified injury of head, initial encounter: Secondary | ICD-10-CM | POA: Insufficient documentation

## 2023-01-11 DIAGNOSIS — M25512 Pain in left shoulder: Secondary | ICD-10-CM | POA: Diagnosis present

## 2023-01-11 DIAGNOSIS — W109XXA Fall (on) (from) unspecified stairs and steps, initial encounter: Secondary | ICD-10-CM | POA: Diagnosis not present

## 2023-01-11 DIAGNOSIS — R220 Localized swelling, mass and lump, head: Secondary | ICD-10-CM | POA: Diagnosis not present

## 2023-01-11 DIAGNOSIS — S40012A Contusion of left shoulder, initial encounter: Secondary | ICD-10-CM | POA: Diagnosis not present

## 2023-01-11 DIAGNOSIS — M4312 Spondylolisthesis, cervical region: Secondary | ICD-10-CM | POA: Diagnosis not present

## 2023-01-11 MED ORDER — ACETAMINOPHEN 325 MG PO TABS
650.0000 mg | ORAL_TABLET | Freq: Once | ORAL | Status: AC
  Administered 2023-01-11: 650 mg via ORAL
  Filled 2023-01-11: qty 2

## 2023-01-11 NOTE — Discharge Instructions (Signed)
Tylenol or ibuprofen as needed for headache, neck pain or shoulder pain, all of your x-rays look great, I would like for you to return to the ER for severe or worsening symptoms otherwise you can follow-up with your doctor.

## 2023-01-11 NOTE — ED Provider Triage Note (Signed)
Emergency Medicine Provider Triage Evaluation Note  Christina Silva , a 89 y.o. female  was evaluated in triage.  Pt complains of head injury and fall.  Occurred today at noon.  Patient was walking up steps and felt dizzy and fell backwards and landed on the back of her head and upper back.  States she has a headache and upper back pain.  Also states it hurts when she takes of breath.  States that dizziness is not new and been going on for a year.  Denies room spinning sensation.  Endorses loss of balance.  Review of Systems  Positive: See above Negative: See above  Physical Exam  BP (!) 156/77 (BP Location: Right Arm)   Pulse 79   Temp 98.6 F (37 C) (Oral)   Resp 12   SpO2 92%  Gen:   Awake, no distress   Resp:  Normal effort  MSK:   Moves extremities without difficulty  Other:    Medical Decision Making  Medically screening exam initiated at 2:46 PM.  Appropriate orders placed.  Christina Silva was informed that the remainder of the evaluation will be completed by another provider, this initial triage assessment does not replace that evaluation, and the importance of remaining in the ED until their evaluation is complete.  Work up started   Gareth Eagle, PA-C 01/11/23 1448

## 2023-01-11 NOTE — ED Triage Notes (Signed)
Pt not on thinners.  Had dizziness and fall onto concrete earlier.  Pain to back of head and back/upper shoulders.  Pt states it is painful to talk.

## 2023-01-11 NOTE — ED Provider Notes (Signed)
[place] EMERGENCY DEPARTMENT AT [facility] Provider Note   CSN: [id number] Arrival date & time: 01/11/23  1318     History  Chief Complaint  Patient presents with   Dizziness   Fall    Christina Silva is a 89 y.o. female.   Dizziness Fall  This patient is an 89 year old female who just prior to arrival had an accidental fall while she was trying to pick something up falling forward as she became dizzy leaning over and fell onto the ground down 3 stairs.  Evidently she struck her head on a concrete step, there is no loss of consciousness, no nausea or vomiting, she complains of headache, mild neck pain and left shoulder and scapular pain.  She has no difficulty breathing, no injuries to her arms or her legs.     Home Medications Prior to Admission medications   Medication Sig Start Date End Date Taking? Authorizing Provider  amLODipine (NORVASC) 2.5 MG tablet Take 1 tablet (2.5 mg total) by mouth every evening. 10/23/22   Daphine Deutscher Mary-Margaret, FNP  aspirin EC 81 MG tablet Take 81 mg by mouth daily. Swallow whole.    [provider]  busPIRone (BUSPAR) 5 MG tablet Take 1 tablet (5 mg total) by mouth 2 (two) times daily as needed. 10/23/22   Daphine Deutscher Mary-Margaret, FNP  cholecalciferol (VITAMIN D) 25 MCG (1000 UNIT) tablet Take 2,000 Units by mouth daily.    [provider]  escitalopram (LEXAPRO) 20 MG tablet Take 1 tablet (20 mg total) by mouth daily. 10/23/22   Daphine Deutscher, Mary-Margaret, FNP  losartan (COZAAR) 100 MG tablet Take 1 tablet (100 mg total) by mouth daily. 10/23/22   Daphine Deutscher, Mary-Margaret, FNP  megestrol (MEGACE) 20 MG tablet TAKE ONE (1) TABLET BY MOUTH EVERY DAY Patient not taking: Reported on 12/13/2022 04/21/22   Bennie Pierini, FNP  pantoprazole (PROTONIX) 40 MG tablet Take 1 tablet (40 mg total) by mouth daily. 10/23/22   Daphine Deutscher Mary-Margaret, FNP  rosuvastatin (CRESTOR) 20 MG tablet TAKE ONE (1) TABLET BY MOUTH EVERY DAY 11/22/22    Daphine Deutscher, Mary-Margaret, FNP      Allergies    Codeine, Lisinopril, Morphine, Other, Sulfa antibiotics, and Sulfonamide derivatives    Review of Systems   Review of Systems  Neurological:  Positive for dizziness.  All other systems reviewed and are negative.   Physical Exam Updated Vital Signs BP (!) 156/77 (BP Location: Right Arm)   Pulse 79   Temp 98.6 F (37 C) (Oral)   Resp 12   SpO2 92%  Physical Exam Constitutional:      General: She is not in acute distress.    Appearance: She is not ill-appearing or diaphoretic.  HENT:     Head: Normocephalic.     Comments: no facial tenderness, deformity, malocclusion or hemotympanum.  no battle's sign or racoon eyes.,  Small hematoma to the right posterior occiput     Nose:     Comments: No nasal septal hematoma    Mouth/Throat:     Comments: Dentition intact, no dental fractures, no laceration to tongue or buccal mucosa Eyes:     General: No scleral icterus.       Right eye: No discharge.        Left eye: No discharge.     Extraocular Movements: Extraocular movements intact.     Conjunctiva/sclera: Conjunctivae normal.     Pupils: Pupils are equal, round, and reactive to light.     Comments: There is  no periorbital swelling or hematoma, no conjunctival erythema  Neck:     Comments: No seat belt sign present on the neck No tenderness to palpation on the neck anteriorly, laterally or posteriorly Cardiovascular:     Rate and Rhythm: Normal rate and regular rhythm.     Pulses: Normal pulses.     Heart sounds: No murmur heard. Pulmonary:     Effort: Pulmonary effort is normal.     Breath sounds: Normal breath sounds. No wheezing or rales.     Comments: There is no tenderness to palpation over the chest wall / no crepitance or subcutaneous emphysema - there is no bruising or ecchymoses / seat belt sign Abdominal:     General: Abdomen is flat. Bowel sounds are normal. There is no distension.     Palpations: Abdomen is soft.      Tenderness: There is no abdominal tenderness.     Comments: No seat belt sign / bruising to abdominal wall  Musculoskeletal:        General: No swelling, tenderness, deformity or signs of injury.     Cervical back: No rigidity.     Right lower leg: No edema.     Left lower leg: No edema.     Comments: The joints are all supple with normal ROM at the joints - the compartments are all soft without tenderness, there is no obvious trauma / bruising / deformity to the arms or legs.  There is no leg length discrepancy.  There is no tenderness to palpation over the cervical, thoracic or lumbar spines.  Mild tenderness over the left scapula, no crepitance or subcutaneous emphysema, normal range of motion of all 4 extremities  Lymphadenopathy:     Cervical: No cervical adenopathy.  Skin:    General: Skin is warm.     Coloration: Skin is not jaundiced.     Findings: No bruising, erythema or lesion.  Neurological:     General: No focal deficit present.     Mental Status: She is alert. Mental status is at baseline.     Coordination: Coordination normal.     Comments: Follows commands without difficulty - has clear speech and normal mentation - moving all 4 extremities as per the patients baseline.  Psychiatric:        Mood and Affect: Mood normal.        Thought Content: Thought content normal.     ED Results / Procedures / Treatments   Labs (all labs ordered are listed, but only abnormal results are displayed) Labs Reviewed - No data to display  EKG EKG Interpretation  Date/Time:  Thursday Jan 11 2023 13:38:40 EDT Ventricular Rate:  79 PR Interval:  148 QRS Duration: 76 QT Interval:  384 QTC Calculation: 440 R Axis:   -19 Text Interpretation: Sinus rhythm with occasional Premature ventricular complexes Minimal voltage criteria for LVH, may be normal variant ( R in aVL ) Anterolateral infarct (cited on or before 09-Apr-2019) Abnormal ECG When compared with ECG of 12-Apr-2020 18:16,  Premature ventricular complexes are now Present Questionable change in initial forces of Septal leads Confirmed by Eber Hong ([id number]) on 01/11/2023 2:51:50 PM  Radiology DG Chest Portable 1 View  Result Date: 01/11/2023 CLINICAL DATA:  Dizziness and fall EXAM: PORTABLE CHEST 1 VIEW COMPARISON:  04/09/2019 FINDINGS: Normal cardiac and mediastinal contours. Aortic atherosclerosis and tortuosity. Redemonstrated left basilar linear opacity, likely scarring and/or atelectasis. No new focal pulmonary opacity. No pleural effusion or pneumothorax. No acute osseous  abnormality. Scoliosis. IMPRESSION: No acute cardiopulmonary disease. Electronically Signed   By: Wiliam Ke M.D.   On: 01/11/2023 15:15   CT Head Wo Contrast  Result Date: 01/11/2023 CLINICAL DATA:  Polytrauma, blunt EXAM: CT HEAD WITHOUT CONTRAST CT CERVICAL SPINE WITHOUT CONTRAST TECHNIQUE: Multidetector CT imaging of the head and cervical spine was performed following the standard protocol without intravenous contrast. Multiplanar CT image reconstructions of the cervical spine were also generated. RADIATION DOSE REDUCTION: This exam was performed according to the departmental dose-optimization program which includes automated exposure control, adjustment of the mA and/or kV according to patient size and/or use of iterative reconstruction technique. COMPARISON:  12/12/19 CT Head FINDINGS: CT HEAD FINDINGS Brain: No evidence of acute cortical infarction, hemorrhage, hydrocephalus, extra-axial collection or mass lesion/mass effect. Sequela of moderate chronic microvascular ischemic change. Chronic right lentiform nucleus infarct. Vascular: No hyperdense vessel or unexpected calcification. Skull: Mild soft tissue swelling along the right parietal scalp. No evidence of underlying calvarial fracture. Sinuses/Orbits: No mastoid or middle ear effusion. Paranasal sinuses are clear. Bilateral lens replacement. Orbits are otherwise unremarkable. Other: None.  CT CERVICAL SPINE FINDINGS Alignment: Grade 1 anterolisthesis of C7 on T1. Skull base and vertebrae: No acute fracture. No primary bone lesion or focal pathologic process. Soft tissues and spinal canal: No prevertebral fluid or swelling. No visible canal hematoma. Disc levels:  No evidence of high-grade spinal canal stenosis. Upper chest: Negative. Other: None IMPRESSION: 1. No acute intracranial abnormality. Mild soft tissue swelling along the right parietal scalp. No evidence of underlying calvarial fracture. 2. No acute fracture or traumatic malalignment of the cervical spine. Electronically Signed   By: Lorenza Cambridge M.D.   On: 01/11/2023 14:50   CT Cervical Spine Wo Contrast  Result Date: 01/11/2023 CLINICAL DATA:  Polytrauma, blunt EXAM: CT HEAD WITHOUT CONTRAST CT CERVICAL SPINE WITHOUT CONTRAST TECHNIQUE: Multidetector CT imaging of the head and cervical spine was performed following the standard protocol without intravenous contrast. Multiplanar CT image reconstructions of the cervical spine were also generated. RADIATION DOSE REDUCTION: This exam was performed according to the departmental dose-optimization program which includes automated exposure control, adjustment of the mA and/or kV according to patient size and/or use of iterative reconstruction technique. COMPARISON:  12/12/19 CT Head FINDINGS: CT HEAD FINDINGS Brain: No evidence of acute cortical infarction, hemorrhage, hydrocephalus, extra-axial collection or mass lesion/mass effect. Sequela of moderate chronic microvascular ischemic change. Chronic right lentiform nucleus infarct. Vascular: No hyperdense vessel or unexpected calcification. Skull: Mild soft tissue swelling along the right parietal scalp. No evidence of underlying calvarial fracture. Sinuses/Orbits: No mastoid or middle ear effusion. Paranasal sinuses are clear. Bilateral lens replacement. Orbits are otherwise unremarkable. Other: None. CT CERVICAL SPINE FINDINGS Alignment:  Grade 1 anterolisthesis of C7 on T1. Skull base and vertebrae: No acute fracture. No primary bone lesion or focal pathologic process. Soft tissues and spinal canal: No prevertebral fluid or swelling. No visible canal hematoma. Disc levels:  No evidence of high-grade spinal canal stenosis. Upper chest: Negative. Other: None IMPRESSION: 1. No acute intracranial abnormality. Mild soft tissue swelling along the right parietal scalp. No evidence of underlying calvarial fracture. 2. No acute fracture or traumatic malalignment of the cervical spine. Electronically Signed   By: Lorenza Cambridge M.D.   On: 01/11/2023 14:50    Procedures Procedures    Medications Ordered in ED Medications  acetaminophen (TYLENOL) tablet 650 mg (has no administration in time range)    ED Course/ Medical Decision Making/ A&P  Medical Decision Making Amount and/or Complexity of Data Reviewed Radiology: ordered.  Risk OTC drugs.    This patient presents to the ED for concern of minor injury to the head and the shoulder differential diagnosis includes fracture, dislocation, rib fracture, pneumothorax, possibly intracranial injury or spinal fracture    Additional history obtained:  Additional history obtained from medical record External records from outside source obtained and reviewed including office visits, follows with family doctor, most recently seen within the last month by cardiology, no recent admissions to the hospital   Lab Tests:  I not indicated  Imaging Studies ordered:  I ordered imaging studies including CT scan of the head and cervical spine as well as a chest x-ray I independently visualized and interpreted imaging which showed no fractures of the spine, no intracranial hemorrhage or obvious brain injury, no fractures of the ribs or pneumothorax, scapula appears normal, the visualized structures of the left shoulder appear normal I agree with the radiologist  interpretation   Medicines ordered and prescription drug management:  I ordered medication including Tylenol at the patient's request for pain Reevaluation of the patient after these medicines showed that the patient improved I have reviewed the patients home medicines and have made adjustments as needed   Problem List / ED Course:  Multiple minor injuries and contusions, she has a small hematoma to the scalp but nothing that is bleeding, she is not anticoagulated she is neurologically intact, vitals are normal, family is there to be with her for the next few days and they understand the indications for return   Social Determinants of Health:   I have discussed with the patient at the bedside the results, and the meaning of these results.  They have expressed her understanding to the need for follow-up with primary care physician          Final Clinical Impression(s) / ED Diagnoses Final diagnoses:  Contusion of left shoulder, initial encounter  Minor head injury without loss of consciousness, initial encounter     Eber Hong, MD 01/11/23 1603

## 2023-01-15 ENCOUNTER — Other Ambulatory Visit: Payer: Self-pay | Admitting: Nurse Practitioner

## 2023-01-15 DIAGNOSIS — I10 Essential (primary) hypertension: Secondary | ICD-10-CM

## 2023-01-16 ENCOUNTER — Ambulatory Visit (INDEPENDENT_AMBULATORY_CARE_PROVIDER_SITE_OTHER): Payer: Medicare Other | Admitting: Nurse Practitioner

## 2023-01-16 ENCOUNTER — Encounter: Payer: Self-pay | Admitting: Nurse Practitioner

## 2023-01-16 VITALS — BP 155/70 | HR 73 | Temp 97.6°F | Resp 20 | Ht 64.0 in | Wt 141.0 lb

## 2023-01-16 DIAGNOSIS — R2681 Unsteadiness on feet: Secondary | ICD-10-CM

## 2023-01-16 DIAGNOSIS — W19XXXD Unspecified fall, subsequent encounter: Secondary | ICD-10-CM

## 2023-01-16 DIAGNOSIS — Z09 Encounter for follow-up examination after completed treatment for conditions other than malignant neoplasm: Secondary | ICD-10-CM

## 2023-01-16 NOTE — Progress Notes (Signed)
Subjective:    Patient ID: Christina Silva, female    DOB: [birth year], 89 y.o.   MRN: [id number]   Chief Complaint: Hospitalization Follow-up   Pt here today for ER follow-up; she was seen in the ER on 01/11/23 after sustaining a fall at home; states she felt dizzy and lost her balance and fell; she did hit her head on a concrete step and  landed on her L shoulder. No LOC; she had a headache when she presented to the ER but no other symptoms. Record review shows head CT was negative and shoulder XR negative for Fx or dislocation. Pt states she has been doing well since; no headaches, N/V, vision changes, some episodes of dizziness but states this is no different than usual for her. L shoulder and side still sore, but getting better every day. Pt does have difficulty standing from a seated position; requesting a lift chair.    Patient Active Problem List   Diagnosis Date Noted   Depression, major, single episode, moderate (HCC) 10/04/2021   Hyperesthesia 06/28/2021   Refractory migraine with aura 06/28/2021   Hyperglycemia 10/18/2018   Late effect of cerebrovascular accident (CVA) 10/18/2018   Chondrocalcinosis of right knee 10/16/2018   Osteopenia 05/24/2015   GERD (gastroesophageal reflux disease) 01/08/2013   Hyperlipidemia with target LDL less than 100 01/08/2013   HTN (hypertension) 01/08/2013   Anxiety state 01/08/2013       Review of Systems  HENT:  Negative for facial swelling and nosebleeds.   Eyes:  Negative for visual disturbance.  Respiratory:  Negative for chest tightness and shortness of breath.   Cardiovascular:  Negative for chest pain, palpitations and leg swelling.  Gastrointestinal:  Negative for nausea and vomiting.  Musculoskeletal:  Positive for arthralgias.  Neurological:  Negative for syncope, light-headedness and headaches.  All other systems reviewed and are negative.      Objective:   Physical Exam Vitals and nursing note reviewed.   Constitutional:      Appearance: Normal appearance. She is well-developed and well-groomed. She is not ill-appearing.  HENT:     Head: Normocephalic.     Comments: No palpable or visible knots/hematomas  Eyes:     Extraocular Movements: Extraocular movements intact.     Conjunctiva/sclera: Conjunctivae normal.     Pupils: Pupils are equal, round, and reactive to light.  Cardiovascular:     Rate and Rhythm: Normal rate and regular rhythm.     Pulses: Normal pulses.     Heart sounds: Normal heart sounds.  Pulmonary:     Effort: Pulmonary effort is normal. No accessory muscle usage or respiratory distress.     Breath sounds: Normal breath sounds.  Abdominal:     General: Abdomen is flat. Bowel sounds are normal.     Palpations: Abdomen is soft.     Tenderness: There is no abdominal tenderness.  Musculoskeletal:     Right shoulder: Normal.     Left shoulder: Tenderness present. No swelling, deformity or crepitus. Normal range of motion. Normal strength.     Cervical back: Normal range of motion. No crepitus.  Skin:    General: Skin is warm and dry.     Findings: Bruising present.          Comments: Bruise to L upper anterior arm  Neurological:     General: No focal deficit present.     Mental Status: She is alert and oriented to person, place, and time.  BP (!) 155/70   Pulse 73   Temp 97.6 F (36.4 C) (Temporal)   Resp 20   Ht 5\' 4"  (1.626 m)   Wt 141 lb (64 kg)   SpO2 95%   BMI 24.20 kg/m      Assessment & Plan:   Christina Silva comes in today with chief complaint of Hospitalization Follow-up   Diagnosis and orders addressed:  1. Difficulty standing Prescription for lift chair given to patient.  2. Fall, subsequent encounter Discussed fall prevention strategies Use cane/walker Avoid using steps without assistance Change positions slowly Grab bars in shower/bathroom Remove rugs or other trip hazards Keep walkways and pathways clear and well  lit Recommended life alert or other fall detection system; pt states she will consider this. Keep phone on you at all times in case of emergency  3. Hospital discharge follow-up Hospital records reviewed  Labs pending Health Maintenance reviewed Diet and exercise encouraged  Follow up plan: As needed for new or worsening symptoms or if symptoms fail to improve.  Consuello Bossier, FNP student    Bennie Pierini, FNP

## 2023-01-16 NOTE — Patient Instructions (Signed)
Fall Prevention in the Home, Adult Falls can cause injuries and can happen to people of all ages. There are many things you can do to make your home safer and to help prevent falls. What actions can I take to prevent falls? General information Use good lighting in all rooms. Make sure to: Replace any light bulbs that burn out. Turn on the lights in dark areas and use night-lights. Keep items that you use often in easy-to-reach places. Lower the shelves around your home if needed. Move furniture so that there are clear paths around it. Do not use throw rugs or other things on the floor that can make you trip. If any of your floors are uneven, fix them. Add color or contrast paint or tape to clearly mark and help you see: Grab bars or handrails. First and last steps of staircases. Where the edge of each step is. If you use a ladder or stepladder: Make sure that it is fully opened. Do not climb a closed ladder. Make sure the sides of the ladder are locked in place. Have someone hold the ladder while you use it. Know where your pets are as you move through your home. What can I do in the bathroom?     Keep the floor dry. Clean up any water on the floor right away. Remove soap buildup in the bathtub or shower. Buildup makes bathtubs and showers slippery. Use non-skid mats or decals on the floor of the bathtub or shower. Attach bath mats securely with double-sided, non-slip rug tape. If you need to sit down in the shower, use a non-slip stool. Install grab bars by the toilet and in the bathtub and shower. Do not use towel bars as grab bars. What can I do in the bedroom? Make sure that you have a light by your bed that is easy to reach. Do not use any sheets or blankets on your bed that hang to the floor. Have a firm chair or bench with side arms that you can use for support when you get dressed. What can I do in the kitchen? Clean up any spills right away. If you need to reach something  above you, use a step stool with a grab bar. Keep electrical cords out of the way. Do not use floor polish or wax that makes floors slippery. What can I do with my stairs? Do not leave anything on the stairs. Make sure that you have a light switch at the top and the bottom of the stairs. Make sure that there are handrails on both sides of the stairs. Fix handrails that are broken or loose. Install non-slip stair treads on all your stairs if they do not have carpet. Avoid having throw rugs at the top or bottom of the stairs. Choose a carpet that does not hide the edge of the steps on the stairs. Make sure that the carpet is firmly attached to the stairs. Fix carpet that is loose or worn. What can I do on the outside of my home? Use bright outdoor lighting. Fix the edges of walkways and driveways and fix any cracks. Clear paths of anything that can make you trip, such as tools or rocks. Add color or contrast paint or tape to clearly mark and help you see anything that might make you trip as you walk through a door, such as a raised step or threshold. Trim any bushes or trees on paths to your home. Check to see if handrails are loose   or broken and that both sides of all steps have handrails. Install guardrails along the edges of any raised decks and porches. Have leaves, snow, or ice cleared regularly. Use sand, salt, or ice melter on paths if you live where there is ice and snow during the winter. Clean up any spills in your garage right away. This includes grease or oil spills. What other actions can I take? Review your medicines with your doctor. Some medicines can cause dizziness or changes in blood pressure, which increase your risk of falling. Wear shoes that: Have a low heel. Do not wear high heels. Have rubber bottoms and are closed at the toe. Feel good on your feet and fit well. Use tools that help you move around if needed. These include: Canes. Walkers. Scooters. Crutches. Ask  your doctor what else you can do to help prevent falls. This may include seeing a physical therapist to learn to do exercises to move better and get stronger. Where to find more information Centers for Disease Control and Prevention, [employer]: cdc.gov National Institute on Aging: nia.nih.gov National Institute on Aging: nia.nih.gov Contact a doctor if: You are afraid of falling at home. You feel weak, drowsy, or dizzy at home. You fall at home. Get help right away if you: Lose consciousness or have trouble moving after a fall. Have a fall that causes a head injury. These symptoms may be an emergency. Get help right away. Call 911. Do not wait to see if the symptoms will go away. Do not drive yourself to the hospital. This information is not intended to replace advice given to you by your health care provider. Make sure you discuss any questions you have with your health care provider. Document Revised: 04/17/2022 Document Reviewed: 04/17/2022 Elsevier Patient Education  2023 [employer]vier Inc.  

## 2023-01-18 ENCOUNTER — Telehealth: Payer: Self-pay

## 2023-01-18 NOTE — Transitions of Care (Post Inpatient/ED Visit) (Signed)
   01/18/2023  Name: Christina Silva MRN: [id number] DOB: 1933-07-09  Today's TOC FU Call Status: Today's TOC FU Call Status:: Unsuccessul Call (1st Attempt) Unsuccessful Call (1st Attempt) Date: 01/18/23  Red on EMMI-ED Discharge Alert Date & Reason:01/13/23 "Scheduled follow-up appt? No"  Attempted to reach the patient regarding the most recent Inpatient/ED visit.  Follow Up Plan: Additional outreach attempts will be made to reach the patient to complete the Transitions of Care (Post Inpatient/ED visit) call.     Antionette Fairy, RN,BSN,CCM [facility] Care Management Care Management Community Coordinator Direct Phone: [phone] Toll Free: [phone] Fax: [phone]

## 2023-01-19 ENCOUNTER — Other Ambulatory Visit: Payer: Self-pay

## 2023-01-19 ENCOUNTER — Telehealth: Payer: Self-pay | Admitting: Nurse Practitioner

## 2023-01-19 ENCOUNTER — Telehealth: Payer: Self-pay

## 2023-01-19 ENCOUNTER — Emergency Department (HOSPITAL_COMMUNITY)
Admission: EM | Admit: 2023-01-19 | Discharge: 2023-01-19 | Disposition: A | Discharge status: Home / Self Care | Payer: Medicare Other | Attending: Emergency Medicine | Admitting: Emergency Medicine

## 2023-01-19 ENCOUNTER — Emergency Department (HOSPITAL_COMMUNITY): Payer: Medicare Other

## 2023-01-19 ENCOUNTER — Encounter (HOSPITAL_COMMUNITY): Payer: Self-pay

## 2023-01-19 ENCOUNTER — Telehealth: Payer: Self-pay | Admitting: Family Medicine

## 2023-01-19 DIAGNOSIS — M25512 Pain in left shoulder: Secondary | ICD-10-CM | POA: Diagnosis not present

## 2023-01-19 DIAGNOSIS — Z743 Need for continuous supervision: Secondary | ICD-10-CM | POA: Diagnosis not present

## 2023-01-19 DIAGNOSIS — S2232XA Fracture of one rib, left side, initial encounter for closed fracture: Secondary | ICD-10-CM | POA: Diagnosis not present

## 2023-01-19 DIAGNOSIS — R0789 Other chest pain: Secondary | ICD-10-CM | POA: Diagnosis not present

## 2023-01-19 DIAGNOSIS — W19XXXA Unspecified fall, initial encounter: Secondary | ICD-10-CM | POA: Diagnosis not present

## 2023-01-19 DIAGNOSIS — M542 Cervicalgia: Secondary | ICD-10-CM | POA: Diagnosis not present

## 2023-01-19 DIAGNOSIS — Y92009 Unspecified place in unspecified non-institutional (private) residence as the place of occurrence of the external cause: Secondary | ICD-10-CM

## 2023-01-19 DIAGNOSIS — R6889 Other general symptoms and signs: Secondary | ICD-10-CM | POA: Diagnosis not present

## 2023-01-19 DIAGNOSIS — M549 Dorsalgia, unspecified: Secondary | ICD-10-CM | POA: Diagnosis not present

## 2023-01-19 DIAGNOSIS — Z7982 Long term (current) use of aspirin: Secondary | ICD-10-CM | POA: Diagnosis not present

## 2023-01-19 DIAGNOSIS — R069 Unspecified abnormalities of breathing: Secondary | ICD-10-CM | POA: Diagnosis not present

## 2023-01-19 DIAGNOSIS — S299XXA Unspecified injury of thorax, initial encounter: Secondary | ICD-10-CM | POA: Diagnosis present

## 2023-01-19 DIAGNOSIS — W108XXA Fall (on) (from) other stairs and steps, initial encounter: Secondary | ICD-10-CM | POA: Insufficient documentation

## 2023-01-19 LAB — CBC WITH DIFFERENTIAL/PLATELET
Abs Immature Granulocytes: 0.01 10*3/uL (ref 0.00–0.07)
Basophils Absolute: 0 10*3/uL (ref 0.0–0.1)
Basophils Relative: 0 %
Eosinophils Absolute: 0.2 10*3/uL (ref 0.0–0.5)
Eosinophils Relative: 3 %
HCT: 39.4 % (ref 36.0–46.0)
Hemoglobin: 13.3 g/dL (ref 12.0–15.0)
Immature Granulocytes: 0 %
Lymphocytes Relative: 16 %
Lymphs Abs: 1.1 10*3/uL (ref 0.7–4.0)
MCH: 31.4 pg (ref 26.0–34.0)
MCHC: 33.8 g/dL (ref 30.0–36.0)
MCV: 93.1 fL (ref 80.0–100.0)
Monocytes Absolute: 0.6 10*3/uL (ref 0.1–1.0)
Monocytes Relative: 9 %
Neutro Abs: 4.9 10*3/uL (ref 1.7–7.7)
Neutrophils Relative %: 72 %
Platelets: 249 10*3/uL (ref 150–400)
RBC: 4.23 MIL/uL (ref 3.87–5.11)
RDW: 12.2 % (ref 11.5–15.5)
WBC: 6.9 10*3/uL (ref 4.0–10.5)
nRBC: 0 % (ref 0.0–0.2)

## 2023-01-19 LAB — BASIC METABOLIC PANEL
Anion gap: 9 (ref 5–15)
BUN: 18 mg/dL (ref 8–23)
CO2: 23 mmol/L (ref 22–32)
Calcium: 9.1 mg/dL (ref 8.9–10.3)
Chloride: 103 mmol/L (ref 98–111)
Creatinine, Ser: 0.68 mg/dL (ref 0.44–1.00)
GFR, Estimated: >60 mL/min (ref >60)
Glucose, Bld: 109 mg/dL — ABNORMAL HIGH (ref 70–99)
Potassium: 4.1 mmol/L (ref 3.5–5.1)
Sodium: 135 mmol/L (ref 135–145)

## 2023-01-19 LAB — TROPONIN I (HIGH SENSITIVITY)
Troponin I (High Sensitivity): 4 ng/L (ref <18)
Troponin I (High Sensitivity): 5 ng/L (ref <18)

## 2023-01-19 MED ORDER — IOHEXOL 300 MG/ML  SOLN
75.0000 mL | Freq: Once | INTRAMUSCULAR | Status: AC | PRN
  Administered 2023-01-19: 75 mL via INTRAVENOUS

## 2023-01-19 MED ORDER — FENTANYL CITRATE PF 50 MCG/ML IJ SOSY
50.0000 ug | PREFILLED_SYRINGE | Freq: Once | INTRAMUSCULAR | Status: AC
  Administered 2023-01-19: 50 ug via INTRAVENOUS
  Filled 2023-01-19: qty 1

## 2023-01-19 MED ORDER — HYDROCODONE-ACETAMINOPHEN 5-325 MG PO TABS: 1.0000 | ORAL_TABLET | Freq: Four times a day (QID) | ORAL | 0 refills | Status: AC | PRN

## 2023-01-19 MED ORDER — ONDANSETRON HCL 4 MG/2ML IJ SOLN
4.0000 mg | Freq: Once | INTRAMUSCULAR | Status: AC
  Administered 2023-01-19: 4 mg via INTRAVENOUS
  Filled 2023-01-19: qty 2

## 2023-01-19 NOTE — ED Provider Notes (Signed)
[place] EMERGENCY DEPARTMENT AT [facility] Provider Note   CSN: [id number] Arrival date & time: 01/19/23  1238     History  Chief Complaint  Patient presents with   Chest Pain    Christina Silva is a 89 y.o. female.  She had a fall 8 days ago when she became dizzy leaning over went down 3 stairs landed on concrete step.  She had head and neck pain left shoulder and scapular pain.  Had CT imaging of head and neck, chest x-ray, discharged using Tylenol and ibuprofen for pain.  Symptoms worsened this morning pain in her left upper back with any type of movement.  She rates it as severe and worse with twisting turning.  No neck pain or head pain no abdominal or low back pain no numbness or weakness.  She is not on any blood thinners.  No new trauma.  The history is provided by the patient.  Chest Pain Pain location:  L lateral chest (left posterior chest and scapula) Pain radiates to:  L shoulder Pain severity:  Severe Onset quality:  Gradual Duration:  1 day Timing:  Intermittent Progression:  Unchanged Chronicity:  New Context: trauma   Relieved by:  Nothing Worsened by:  Movement Ineffective treatments: Tylenol and ibuprofen. Associated symptoms: back pain   Associated symptoms: no abdominal pain, no cough, no fever, no nausea, no numbness, no shortness of breath, no vomiting and no weakness        Home Medications Prior to Admission medications   Medication Sig Start Date End Date Taking? Authorizing Provider  acetaminophen (TYLENOL) 325 MG tablet Take 650 mg by mouth every 6 (six) hours as needed for mild pain.    [provider]  amLODipine (NORVASC) 2.5 MG tablet TAKE 1 TABLET BY MOUTH EVERY EVENING 01/15/23   Daphine Deutscher, Mary-Margaret, FNP  aspirin EC 81 MG tablet Take 81 mg by mouth daily. Swallow whole.    [provider]  busPIRone (BUSPAR) 5 MG tablet Take 1 tablet (5 mg total) by mouth 2 (two) times daily as needed. 10/23/22   Daphine Deutscher  Mary-Margaret, FNP  cholecalciferol (VITAMIN D) 25 MCG (1000 UNIT) tablet Take 2,000 Units by mouth daily.    [provider]  escitalopram (LEXAPRO) 20 MG tablet Take 1 tablet (20 mg total) by mouth daily. 10/23/22   Daphine Deutscher, Mary-Margaret, FNP  ibuprofen (ADVIL) 200 MG tablet Take 200 mg by mouth every 6 (six) hours as needed for mild pain.    [provider]  losartan (COZAAR) 100 MG tablet Take 1 tablet (100 mg total) by mouth daily. 10/23/22   Daphine Deutscher Mary-Margaret, FNP  megestrol (MEGACE) 20 MG tablet TAKE ONE (1) TABLET BY MOUTH EVERY DAY 04/21/22   Daphine Deutscher, Mary-Margaret, FNP  pantoprazole (PROTONIX) 40 MG tablet Take 1 tablet (40 mg total) by mouth daily. 10/23/22   Daphine Deutscher Mary-Margaret, FNP  rosuvastatin (CRESTOR) 20 MG tablet TAKE ONE (1) TABLET BY MOUTH EVERY DAY 11/22/22   Daphine Deutscher, Mary-Margaret, FNP      Allergies    Codeine, Lisinopril, Morphine, Other, Sulfa antibiotics, and Sulfonamide derivatives    Review of Systems   Review of Systems  Constitutional:  Negative for fever.  Eyes:  Negative for visual disturbance.  Respiratory:  Negative for cough and shortness of breath.   Cardiovascular:  Positive for chest pain.  Gastrointestinal:  Negative for abdominal pain, nausea and vomiting.  Musculoskeletal:  Positive for back pain.  Neurological:  Negative for weakness and numbness.  Physical Exam Updated Vital Signs BP (!) 156/64   Pulse 77   Temp 99.1 F (37.3 C)   Resp 18   Ht 5\' 4"  (1.626 m)   Wt 64 kg   SpO2 94%   BMI 24.20 kg/m  Physical Exam Vitals and nursing note reviewed.  Constitutional:      General: She is not in acute distress.    Appearance: Normal appearance. She is well-developed.  HENT:     Head: Normocephalic and atraumatic.  Eyes:     Conjunctiva/sclera: Conjunctivae normal.  Cardiovascular:     Rate and Rhythm: Normal rate and regular rhythm.     Heart sounds: Normal heart sounds. No murmur heard. Pulmonary:     Effort:  Pulmonary effort is normal. No respiratory distress.     Breath sounds: Normal breath sounds.  Chest:     Chest wall: Tenderness present.     Comments: Tenderness left posterior chest and T-spine, left scapula Abdominal:     Palpations: Abdomen is soft.     Tenderness: There is no abdominal tenderness. There is no guarding or rebound.  Musculoskeletal:        General: No swelling. Normal range of motion.     Cervical back: Neck supple. No tenderness.     Right lower leg: No tenderness. No edema.     Left lower leg: No tenderness. No edema.  Skin:    General: Skin is warm and dry.     Capillary Refill: Capillary refill takes less than 2 seconds.  Neurological:     General: No focal deficit present.     Mental Status: She is alert.     Sensory: No sensory deficit.     Motor: No weakness.     ED Results / Procedures / Treatments   Labs (all labs ordered are listed, but only abnormal results are displayed) Labs Reviewed  BASIC METABOLIC PANEL - Abnormal; Notable for the following components:      Result Value   Glucose, Bld 109 (*)    All other components within normal limits  CBC WITH DIFFERENTIAL/PLATELET  TROPONIN I (HIGH SENSITIVITY)  TROPONIN I (HIGH SENSITIVITY)    EKG EKG Interpretation  Date/Time:  Friday Jan 19 2023 14:07:31 EDT Ventricular Rate:  73 PR Interval:  167 QRS Duration: 97 QT Interval:  403 QTC Calculation: 445 R Axis:   -8 Text Interpretation: Sinus rhythm Anterior infarct, old No significant change since prior 5/24 Confirmed by Meridee Score [phone]) on 01/19/2023 2:59:25 PM  Radiology CT Chest W Contrast  Result Date: 01/19/2023 CLINICAL DATA:  Chest trauma, blunt EXAM: CT CHEST WITH CONTRAST CT THORACIC SPINE WITHOUT CONTRAST TECHNIQUE: Multidetector CT imaging of the chest was performed during intravenous contrast administration. Multidetector CT of the thoracic spine was performed without intravenous contrast administration. RADIATION DOSE  REDUCTION: This exam was performed according to the departmental dose-optimization program which includes automated exposure control, adjustment of the mA and/or kV according to patient size and/or use of iterative reconstruction technique. CONTRAST:  75mL OMNIPAQUE IOHEXOL 300 MG/ML  SOLN COMPARISON:  Chest radiograph May 16, 24. FINDINGS: CT chest: Cardiovascular: Atherosclerosis of the aorta. Normal heart size. No pericardial effusion. Mediastinum/Nodes: No enlarged mediastinal, hilar, or axillary lymph nodes. Thyroid gland, trachea, and esophagus demonstrate no significant findings. Lungs/Pleura: Lungs are clear. No pleural effusion or pneumothorax. Upper Abdomen: No acute abnormality.  Cholecystectomy. Musculoskeletal: Acute mildly displaced fracture of the left posterior eighth rib. CT thoracic spine: Normal alignment of the thoracic  spine. No evidence of vertebral body fracture. Mild-to-moderate multilevel degenerative change without evidence of a high-grade bony or foraminal stenosis. S shaped thoracolumbar curvature. IMPRESSION: 1. Acute mildly displaced fracture of the left posterior eighth rib. 2. Clear lungs.  No pneumothorax. 3. No evidence of acute fracture in the thoracic spine. S-shaped thoracic curvature. 4. Aortic Atherosclerosis (ICD10-I70.0). Electronically Signed   By: Feliberto Harts M.D.   On: 01/19/2023 15:29   CT T-SPINE NO CHARGE  Result Date: 01/19/2023 CLINICAL DATA:  Chest trauma, blunt EXAM: CT CHEST WITH CONTRAST CT THORACIC SPINE WITHOUT CONTRAST TECHNIQUE: Multidetector CT imaging of the chest was performed during intravenous contrast administration. Multidetector CT of the thoracic spine was performed without intravenous contrast administration. RADIATION DOSE REDUCTION: This exam was performed according to the departmental dose-optimization program which includes automated exposure control, adjustment of the mA and/or kV according to patient size and/or use of iterative  reconstruction technique. CONTRAST:  75mL OMNIPAQUE IOHEXOL 300 MG/ML  SOLN COMPARISON:  Chest radiograph May 16, 24. FINDINGS: CT chest: Cardiovascular: Atherosclerosis of the aorta. Normal heart size. No pericardial effusion. Mediastinum/Nodes: No enlarged mediastinal, hilar, or axillary lymph nodes. Thyroid gland, trachea, and esophagus demonstrate no significant findings. Lungs/Pleura: Lungs are clear. No pleural effusion or pneumothorax. Upper Abdomen: No acute abnormality.  Cholecystectomy. Musculoskeletal: Acute mildly displaced fracture of the left posterior eighth rib. CT thoracic spine: Normal alignment of the thoracic spine. No evidence of vertebral body fracture. Mild-to-moderate multilevel degenerative change without evidence of a high-grade bony or foraminal stenosis. S shaped thoracolumbar curvature. IMPRESSION: 1. Acute mildly displaced fracture of the left posterior eighth rib. 2. Clear lungs.  No pneumothorax. 3. No evidence of acute fracture in the thoracic spine. S-shaped thoracic curvature. 4. Aortic Atherosclerosis (ICD10-I70.0). Electronically Signed   By: Feliberto Harts M.D.   On: 01/19/2023 15:29    Procedures Procedures    Medications Ordered in ED Medications  fentaNYL (SUBLIMAZE) injection 50 mcg (has no administration in time range)  ondansetron (ZOFRAN) injection 4 mg (has no administration in time range)    ED Course/ Medical Decision Making/ A&P Clinical Course as of 01/19/23 1817  Fri Jan 19, 2023  1538 CT showing left eighth rib nondisplaced fracture.  No other traumatic findings. [MB]  1551 CT chest and thoracic spine showing nondisplaced rib fracture.  Patient is comfortable plan for discharge and pain control.  Return instructions discussed. [MB]    Clinical Course User Index [MB] Terrilee Files, MD                             Medical Decision Making Amount and/or Complexity of Data Reviewed Labs: ordered. Radiology: ordered.  Risk Prescription  drug management.   This patient complains of left upper back left scapular pain after fall; this involves an extensive number of treatment Options and is a complaint that carries with it a high risk of complications and morbidity. The differential includes fracture, contusion, pneumothorax, ACS  I ordered, reviewed and interpreted labs, which included CBC normal chemistries normal other than mildly elevated glucose troponins flat I ordered medication IV pain medication and reviewed PMP when indicated. I ordered imaging studies which included CT chest CT T-spine and I independently    visualized and interpreted imaging which showed nondisplaced left eighth rib fracture Additional history obtained from patient's sister Previous records obtained and reviewed in epic including recent ED visit and imaging  Cardiac monitoring reviewed, normal sinus rhythm  Social determinants considered, no significant barriers Critical Interventions: None  After the interventions stated above, I reevaluated the patient and found patient's pain to be adequately controlled and vital stable Admission and further testing considered, she is a week out of following so I do not think it is necessary for admission.  She has not really failed pain control yet.  Will prescribe hydrocodone and recommended close follow-up with PCP.  Return instructions discussed         Final Clinical Impression(s) / ED Diagnoses Final diagnoses:  Fracture of one rib, left side, initial encounter for closed fracture    Rx / DC Orders ED Discharge Orders          Ordered    HYDROcodone-acetaminophen (NORCO/VICODIN) 5-325 MG tablet  Every 6 hours PRN        01/19/23 1552              Terrilee Files, MD 01/19/23 1819

## 2023-01-19 NOTE — Telephone Encounter (Signed)
Going to ER. Wanted pain medication.

## 2023-01-19 NOTE — Transitions of Care (Post Inpatient/ED Visit) (Signed)
01/19/2023  Name: Christina Silva MRN: [id number] DOB: 1932-10-08  Today's TOC FU Call Status: Today's TOC FU Call Status:: Successful TOC FU Call Competed TOC FU Call Complete Date: 01/19/23  Red on EMMI-ED Discharge Alert Date & Reason:01/13/23 "Scheduled follow-up appt? No"   Transition Care Management Follow-up Telephone Call Date of Discharge: 01/11/23 [facility] Type of Discharge: Emergency Department Reason for ED Visit: Other: ("contusion of left shoulder") How have you been since you were released from the hospital?: Better (Pt states she continues to have some soreness to left shoulder but pain getting better daily. She is taking pain meds to help manage sxs.) Any questions or concerns?: No  Items Reviewed: Did you receive and understand the discharge instructions provided?: Yes Medications obtained,verified, and reconciled?: Yes (Medications Reviewed) Any new allergies since your discharge?: No Dietary orders reviewed?: NA Do you have support at home?: Yes Name of Support/Comfort Primary Source: daughter  Medications Reviewed Today: Medications Reviewed Today     Reviewed by Charlyn Minerva, RN (Registered Nurse) on 01/19/23 at 0915  Med List Status: <None>   Medication Order Taking? Sig Documenting Provider Last Dose Status Informant  acetaminophen (TYLENOL) 325 MG tablet [id number] Yes Take 650 mg by mouth every 6 (six) hours as needed for mild pain. [provider]  Active Self  amLODipine (NORVASC) 2.5 MG tablet [id number]  TAKE 1 TABLET BY MOUTH EVERY EVENING Martin, Mary-Margaret, FNP  Active   aspirin EC 81 MG tablet [id number]  Take 81 mg by mouth daily. Swallow whole. [provider]  Active   busPIRone (BUSPAR) 5 MG tablet [id number]  Take 1 tablet (5 mg total) by mouth 2 (two) times daily as needed. Daphine Deutscher, Mary-Margaret, FNP  Active   cholecalciferol (VITAMIN D) 25 MCG (1000 UNIT) tablet [id number]  Take  2,000 Units by mouth daily. [provider]  Active Self  escitalopram (LEXAPRO) 20 MG tablet [id number]  Take 1 tablet (20 mg total) by mouth daily. Daphine Deutscher, Mary-Margaret, FNP  Active   ibuprofen (ADVIL) 200 MG tablet [id number] Yes Take 200 mg by mouth every 6 (six) hours as needed for mild pain. [provider]  Active Self  losartan (COZAAR) 100 MG tablet [id number]  Take 1 tablet (100 mg total) by mouth daily. Daphine Deutscher, Mary-Margaret, FNP  Active   megestrol (MEGACE) 20 MG tablet [id number]  TAKE ONE (1) TABLET BY MOUTH EVERY DAY Daphine Deutscher, Mary-Margaret, FNP  Active   pantoprazole (PROTONIX) 40 MG tablet [id number]  Take 1 tablet (40 mg total) by mouth daily. Daphine Deutscher, Mary-Margaret, FNP  Active   rosuvastatin (CRESTOR) 20 MG tablet [id number]  TAKE ONE (1) TABLET BY MOUTH EVERY DAY Daphine Deutscher Mary-Margaret, FNP  Active             Home Care and Equipment/Supplies: Were Home Health Services Ordered?: NA Any new equipment or medical supplies ordered?: NA  Functional Questionnaire: Do you need assistance with meal preparation?: No Do you need assistance with eating?: No Do you have difficulty maintaining continence: No Do you need assistance with getting out of bed/getting out of a chair/moving?: No Do you have difficulty managing or taking your medications?: No  Follow up appointments reviewed: PCP Follow-up appointment confirmed?: Yes Date of PCP follow-up appointment?: 01/16/23 Follow-up Provider: [facility] Follow-up appointment confirmed?: NA Do you need transportation to your follow-up appointment?: No Do you understand care options if your condition(s) worsen?: Yes-patient verbalized understanding  SDOH Interventions Today    Flowsheet  Row Most Recent Value  SDOH Interventions   Food Insecurity Interventions Intervention Not Indicated  Transportation Interventions Intervention Not Indicated      TOC Interventions Today     Flowsheet Row Most Recent Value  TOC Interventions   TOC Interventions Discussed/Reviewed TOC Interventions Discussed, Post discharge activity limitations per provider      Interventions Today    Flowsheet Row Most Recent Value  General Interventions   General Interventions Discussed/Reviewed General Interventions Discussed, Doctor Visits  Doctor Visits Discussed/Reviewed Doctor Visits Discussed, PCP  PCP/Specialist Visits Compliance with follow-up visit  Education Interventions   Education Provided Provided Education  Provided Verbal Education On Nutrition, When to see the doctor, Medication  Nutrition Interventions   Nutrition Discussed/Reviewed Nutrition Discussed  Pharmacy Interventions   Pharmacy Dicussed/Reviewed Pharmacy Topics Discussed, Medications and their functions  Safety Interventions   Safety Discussed/Reviewed Safety Discussed, Fall Risk, Home Safety  Home Safety Assistive Devices       [place], [facility] Care Management Care Management Community Coordinator Direct Phone: [phone] Toll Free: [phone] Fax: [phone]

## 2023-01-19 NOTE — Discharge Instructions (Addendum)
You were seen in the emergency department for worsening left-sided upper back pain after recent fall.  Your CAT scan showed a fractured rib on the left.  This will usually heal over time.  We are prescribing her narcotic pain medicine to use for severe pain.  Follow-up with your regular doctor.  Return to the emergency department if any worsening or concerning symptoms.

## 2023-01-19 NOTE — ED Triage Notes (Signed)
Patient had a fall on the 15th and had a CT and xrays done at [facility]. Today she complains of around 10 am rib pain on the left side that increases with breathing and talking

## 2023-01-19 NOTE — Telephone Encounter (Signed)
I have ordered MRI. Unsure when this will be approved and ordered. If pain is that pain, may want to see [facility].

## 2023-01-19 NOTE — Telephone Encounter (Signed)
Daughter called back and states patient was so bad in soo much pain they called EMS and took her to ER she is at [facility] now. Patient daughter wanted to tell you thank you for your help.

## 2023-01-19 NOTE — Telephone Encounter (Signed)
DAUGHTER IS CALLING AND IS IN EXTREME PAIN SHE IS WANTING AN ORDER PUT ON FOR MRI OF LEFT SHOULDER BECAUSE OF PAIN. PLEASE ADVISE

## 2023-02-15 ENCOUNTER — Telehealth: Payer: Self-pay | Admitting: Nurse Practitioner

## 2023-02-15 NOTE — Telephone Encounter (Signed)
Patient wants to talk to PCP about her lift chair. Already has a prescription for it. No other info given. Please  call back.

## 2023-02-15 NOTE — Telephone Encounter (Signed)
Returned patients call and answered all her questions.

## 2023-02-19 ENCOUNTER — Ambulatory Visit (HOSPITAL_COMMUNITY)
Admission: RE | Admit: 2023-02-19 | Discharge: 2023-02-19 | Disposition: A | Discharge status: Home / Self Care | Payer: Medicare Other | Source: Ambulatory Visit | Attending: Family | Admitting: Family

## 2023-02-19 DIAGNOSIS — M25512 Pain in left shoulder: Secondary | ICD-10-CM | POA: Insufficient documentation

## 2023-02-19 DIAGNOSIS — W19XXXA Unspecified fall, initial encounter: Secondary | ICD-10-CM | POA: Insufficient documentation

## 2023-02-19 DIAGNOSIS — M67814 Other specified disorders of tendon, left shoulder: Secondary | ICD-10-CM | POA: Insufficient documentation

## 2023-02-19 DIAGNOSIS — M19012 Primary osteoarthritis, left shoulder: Secondary | ICD-10-CM | POA: Diagnosis not present

## 2023-02-19 DIAGNOSIS — M7582 Other shoulder lesions, left shoulder: Secondary | ICD-10-CM | POA: Diagnosis not present

## 2023-02-26 ENCOUNTER — Telehealth: Payer: Self-pay | Admitting: Nurse Practitioner

## 2023-02-26 NOTE — Telephone Encounter (Signed)
Patient notified and verbalized understanding. 

## 2023-02-26 NOTE — Telephone Encounter (Signed)
Mri showed no issues. Old rotator cuff repair is intact

## 2023-03-13 NOTE — Progress Notes (Unsigned)
[facility] [address] [place]. [place] [address] [phone]       OFFICE FOLLOW UP VISIT NOTE  [name] Date of Birth:  [birth year] Medical Record Number:  [id number]   Primary neurologist: Dr. Pearlean Brownie Referring MD: Bennie Pierini, FNP  Reason for Referral: Stroke 2nd opinion  No chief complaint on file.     HPI:   Initial visit 10/19/2020 Dr. Pearlean Brownie: Christina Silva is a 89 year old pleasant Caucasian lady seen today for initial office consultation visit. She is accompanied by her daughter. History is obtained from them and review of electronic medical records and up reviewed personally pertinent imaging films in PACS. She has past medical history of anxiety, hypertension, hyperlipidemia and gastroesophageal reflux disease. She was admitted to [facility] on 10/18/2018 with sudden onset of left upper extremity weakness and numbness. MRI scan at that time it showed a large 2 cm right basal ganglia infarct and MRI of the brain had shown moderate left P2 stenosis. Echocardiogram was unremarkable. She was started on aspirin and Plavix and will saw Dr. Gerilyn Pilgrim. She states she did well from the stroke and made good recovery. 6 months ago she had several episodes of transient left hemifield vision loss along with the dull headache which lasted 15 minutes and she also saw flashing lights. This occurred once daily for 3 days. 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 However MRA of the brain done on 09/01/2020 showed a new right posterior cerebral artery occlusion close to its origin and stable appearance of the moderate left P1/P2 stenosis. Prior carotid ultrasound on 04/22/2020 had shown no significant extracranial stenosis. Patient is not sure when her last lipid profile or A1c was checked. She is on aspirin and Plavix and tolerating it well without major  bleeding but does have frequent bruising. On inquiry she admits to a few episodes of transient horizontal diplopia that she has had in the last few months. This has occurred mostly when she is driving she can see 2 lines instead of one and on one occasion she had to pull off the road and sit in [place] parking lot till this improved. She has been scared to drive since then. She has seen Dr. Gerilyn Pilgrim in the past and he had suggested doing a 30-day heart monitor but for unclear reason that this never happened and patient was unhappy and is here to see me for a 2nd opinion. Update 02/08/2021 Dr. Pearlean Brownie; she returns for follow-up after last visit on 10/19/2020.  She is accompanied by her daughter Christina Silva.  Patient states she has had 3 episodes of brief visual disturbance followed by mild headache on May 9, May 10 and June 11.  They were all stereotypical and similar.  Whole episode lasted 10 to 15 minutes.  She just noticed some visual disturbance in the left eye which moved across to the right eye.  She could still see.  She had a mild dull headache later.  There were no clear triggers or precipitating factors that she could identify.  Patient had lab work at last visit on 10/19/2020 which showed ESR of 14 mm.  LDL cholesterol 111 mg percent and triglycerides 156.  A hemoglobin A1c was 5.8.  I recommended we increase the dose of Crestor to 10 mg but patient's primary physician increased to 20 mg which she did not tolerate and developed muscle aches and  pains and it was discontinued.  She has been since restarted on 5 mg of Crestor which she is so far tolerating well.  She was also started on Topamax by Dr. Gerilyn Pilgrim neurologist but she is taking it only as as needed.  She is tolerating aspirin well without any bruising or bleeding.  Blood pressure is usually well controlled today slightly elevated in office at 152/74.  She has no new complaints.  She had outpatient loop recorder placed by Dr. Johney Frame and so for paroxysmal  A. fib has not yet been found.  Update 05/19/2021 JM: Returns for 30-month follow-up after prior visit with Dr. Pearlean Brownie.  She is accompanied by her daughter.  Reports 1 episode last week of flashing lights in vision followed by typical headache as well as 4 episodes of double vision while in the car (2 while driving, 2 while riding) lasting 3-4 min followed by typical headache.  She reports experiencing double vision intermittently over the past year.  Routinely followed by ophthalmology.  Has remained on topiramate 25 mg twice daily tolerating well but does not believe this is providing much benefit.  Denies new neurological symptoms.  Remains on aspirin and Crestor 20 mg daily without side effects.  Blood pressure today elevated at 162/80. Routinely monitor at home and typically stable around 120s/60-70s. Loop recorder has not shown atrial fibrillation thus far.  No new concerns at this time.  Update 11/29/2021 JM: Patient returns for follow-up visit after prior visit 6 months ago accompanied by her daughter.  She has been doing well since prior visit.    She has not had any recent migraine headaches since around Christmas. She has since stopped taking topiramate 50 mg twice daily beginning of this year due to having no headaches and no benefit even with increased dose. She did not try [place] as insurance would not cover (PA declined thru PCP office).  She does c/o pain behind left ear only when putting pressure on it such as laying head down on pillow and occasional zap type sensation of left side of head near midline, will resolve once she relieves pressure, present over the past 3-4 months.  Denies any traumatic event, fall or whiplash  She has not had any additional double vision since she was seen by [facility] and started to use prism lenses (was told this was from weakened eye muscle from age). Did have MRI 06/2021 due to diplopia which did not show any new findings.    Compliant on aspirin and Crestor,  denies side effects.  Blood pressure today 133/66.  Loop recorder has not shown atrial fibrillation thus far.  She is scheduled for explant of ILR on 4/6 with Dr. Johney Frame.   No further concerns at this time.   Update 03/14/2023 JM: Patient returns per request ***         ROS:   14 system review of systems is positive for those listed in HPI and all other systems negative  PMH:  Past Medical History:  Diagnosis Date   Anxiety    Cataract    GERD (gastroesophageal reflux disease)    Hyperlipidemia    Hypertension    SCCA (squamous cell carcinoma) of skin 11/02/2020   Mid Chest (in situ) (tx p bx)   SCCA (squamous cell carcinoma) of skin 11/02/2020   Right Forearm Posterior (in situ) (tx p bx)   SCCA (squamous cell carcinoma) of skin 11/02/2020   Left Submandibular Area (in situ) (tx p bx)   Stroke (  HCC) 09/2018   Vertigo    patient denies    Social History:  Social History   Socioeconomic History   Marital status: Widowed    Spouse name: Not on file   Number of children: 2   Years of education: 10   Highest education level: Not on file  Occupational History   Occupation: retired  Tobacco Use   Smoking status: Former    Current packs/day: 0.00    Types: Cigarettes    Quit date: 01/09/1983    Years since quitting: 40.2   Smokeless tobacco: Never  Vaping Use   Vaping status: Never Used  Substance and Sexual Activity   Alcohol use: No   Drug use: No   Sexual activity: Yes  Other Topics Concern   Not on file  Social History Narrative   Lives alone handicap accessible home - daughter in [place] and son in [place].   Right Handed   Drinks caffeine occassionally   Social Determinants of Health   Financial Resource Strain: Low Risk  (07/10/2022)   Overall Financial Resource Strain (CARDIA)    Difficulty of Paying Living Expenses: Not hard at all  Food Insecurity: No Food Insecurity (01/19/2023)   Hunger Vital Sign    Worried About Running Out of Food  in the Last Year: Never true    Ran Out of Food in the Last Year: Never true  Transportation Needs: No Transportation Needs (01/19/2023)   PRAPARE - Administrator, Civil Service (Medical): No    Lack of Transportation (Non-Medical): No  Physical Activity: Insufficiently Active (07/10/2022)   Exercise Vital Sign    Days of Exercise per Week: 3 days    Minutes of Exercise per Session: 30 min  Stress: No Stress Concern Present (07/10/2022)   [employer] of Occupational Health - Occupational Stress Questionnaire    Feeling of Stress : Only a little  Social Connections: Moderately Isolated (07/10/2022)   Social Connection and Isolation Panel [NHANES]    Frequency of Communication with Friends and Family: More than three times a week    Frequency of Social Gatherings with Friends and Family: More than three times a week    Attends Religious Services: 1 to 4 times per year    Active Member of [employer] or Organizations: No    Attends Banker Meetings: Never    Marital Status: Widowed  Intimate Partner Violence: Not At Risk (07/10/2022)   Humiliation, Afraid, Rape, and Kick questionnaire    Fear of Current or Ex-Partner: No    Emotionally Abused: No    Physically Abused: No    Sexually Abused: No    Medications:   Current Outpatient Medications on File Prior to Visit  Medication Sig Dispense Refill   acetaminophen (TYLENOL) 325 MG tablet Take 650 mg by mouth every 6 (six) hours as needed for mild pain.     amLODipine (NORVASC) 2.5 MG tablet TAKE 1 TABLET BY MOUTH EVERY EVENING 90 tablet 0   aspirin EC 81 MG tablet Take 81 mg by mouth daily. Swallow whole.     busPIRone (BUSPAR) 5 MG tablet Take 1 tablet (5 mg total) by mouth 2 (two) times daily as needed. 60 tablet 5   cholecalciferol (VITAMIN D) 25 MCG (1000 UNIT) tablet Take 2,000 Units by mouth daily.     escitalopram (LEXAPRO) 20 MG tablet Take 1 tablet (20 mg total) by mouth daily. 90 tablet 1    HYDROcodone-acetaminophen (NORCO/VICODIN) 5-325 MG tablet Take  1 tablet by mouth every 6 (six) hours as needed. 10 tablet 0   ibuprofen (ADVIL) 200 MG tablet Take 200 mg by mouth every 6 (six) hours as needed for mild pain.     losartan (COZAAR) 100 MG tablet Take 1 tablet (100 mg total) by mouth daily. 90 tablet 1   megestrol (MEGACE) 20 MG tablet TAKE ONE (1) TABLET BY MOUTH EVERY DAY 90 tablet 1   pantoprazole (PROTONIX) 40 MG tablet Take 1 tablet (40 mg total) by mouth daily. 90 tablet 1   rosuvastatin (CRESTOR) 20 MG tablet TAKE ONE (1) TABLET BY MOUTH EVERY DAY 90 tablet 1   No current facility-administered medications on file prior to visit.    Allergies:   Allergies  Allergen Reactions   Codeine Nausea And Vomiting   Lisinopril Swelling   Morphine Nausea And Vomiting   Other    Sulfa Antibiotics Nausea Only and Rash   Sulfonamide Derivatives Nausea Only and Rash    Physical Exam There were no vitals filed for this visit.   There is no height or weight on file to calculate BMI.   General: Very pleasant frail elderly Caucasian lady seated, in no evident distress Head: head normocephalic and atraumatic.  Tenderness with palpation of left lesser occipital nerve Neck: supple with soft right greater than left carotid bruits. Cardiovascular: Regular rate and rhythm. No murmur. Musculoskeletal: no deformity Skin:  no rash/petichiae Vascular:  Normal pulses all extremities  Neurologic Exam Mental Status: Awake and fully alert.  Fluent speech and language.  Oriented to place and time. Recent and remote memory intact. Attention span, concentration and fund of knowledge appropriate. Mood and affect appropriate.  Cranial Nerves: Pupils equal, briskly reactive to light. Extraocular movements full without nystagmus. Visual fields full to confrontation. Hearing diminished bilaterally.  Facial sensation intact. Face, tongue, palate moves normally and symmetrically.  Motor: Normal bulk  and tone. Normal strength in all tested extremity muscles. Sensory.: intact to touch , pinprick , position and vibratory sensation.  Coordination: Rapid alternating movements normal in all extremities. Finger-to-nose and heel-to-shin performed accurately bilaterally. Gait and Station: Arises from chair without difficulty. Stance is normal. Gait demonstrates normal stride length and balance . Able to heel, toe and tandem walk with mild difficulty.  Reflexes: 1+ and symmetric. Toes downgoing.       ASSESSMENT/PLAN: 89 year old Caucasian lady with a large right basal ganglia infarct in February 2020 of cryptogenic etiology with recurrent  episodes of transient visual disturbances followed by mild headache likely atypical  migraine rather than TIAs.  3-4 mo onset of likely left occipital neuralgia    1.  Atypical migraines -no additional migraine headaches or visual disturbances since 07/2021 -no benefit with use of topamax -no indication for preventative therapy at this time -Continue to monitor  2.  Occipital neuralgia -as symptoms intermittent and not overly bothersome, can continue to monitor -discussed typical treatment for condition which would be nerve blocks but not interested at this time -additional information provided  2. Hx of R BG stroke, cryptogenic  -Continue aspirin 81 mg daily and Crestor 20 mg daily for secondary stroke prevention measures -Loop recorder has not shown atrial fibrillation thus far -Routine follow-up with PCP for aggressive stroke risk factor management - maintain aggressive risk factor modification with strict control of hypertension with blood pressure goal below 130/90, and lipids with LDL cholesterol goal below 70 mg percent.   -Stroke labs: LDL 77 09/2021, A1c 5.6 01/2021   Can follow-up  on an as-needed basis.  Advised to call with any reoccurring migraine headaches or worsening of occipital neuralgia    CC:  Bennie Pierini, FNP   I spent  31 minutes of face-to-face and non-face-to-face time with patient and daughter.  This included previsit chart review, lab review, study review, electronic health record documentation, patient and daughter education and discussion regarding above diagnoses and treatment plan and answered all other questions to patient and daughter satisfaction  Ihor Austin, [facility]  [facility] [address] [place], [place] [address]  Phone [phone] Fax [phone] Note: This document was prepared with digital dictation and possible smart phrase technology. Any transcriptional errors that result from this process are unintentional.

## 2023-03-14 ENCOUNTER — Encounter: Payer: Self-pay | Admitting: Adult Health

## 2023-03-14 ENCOUNTER — Ambulatory Visit: Payer: Medicare Other | Admitting: Adult Health

## 2023-03-14 VITALS — BP 112/65 | HR 69 | Ht 64.0 in | Wt 142.0 lb

## 2023-03-14 DIAGNOSIS — G43009 Migraine without aura, not intractable, without status migrainosus: Secondary | ICD-10-CM

## 2023-03-14 DIAGNOSIS — I639 Cerebral infarction, unspecified: Secondary | ICD-10-CM | POA: Diagnosis not present

## 2023-03-14 DIAGNOSIS — H539 Unspecified visual disturbance: Secondary | ICD-10-CM | POA: Diagnosis not present

## 2023-03-14 NOTE — Patient Instructions (Addendum)
You will be called to schedule a CTA of your head and neck  If symptoms persist and associated with headache and would like to start on a preventative medication, please let me know  Continue aspirin and crestor for stroke prevention measures   Continue to follow with your primary doctor for stroke risk factor management

## 2023-03-15 ENCOUNTER — Telehealth: Payer: Self-pay | Admitting: Adult Health

## 2023-03-15 NOTE — Telephone Encounter (Signed)
[employer] medicare NPR sent to GI [phone] 

## 2023-03-21 ENCOUNTER — Other Ambulatory Visit: Payer: Self-pay | Admitting: Nurse Practitioner

## 2023-03-21 DIAGNOSIS — I10 Essential (primary) hypertension: Secondary | ICD-10-CM

## 2023-03-28 ENCOUNTER — Other Ambulatory Visit: Payer: Medicare Other

## 2023-04-09 ENCOUNTER — Ambulatory Visit
Admission: RE | Admit: 2023-04-09 | Discharge: 2023-04-09 | Disposition: A | Discharge status: Home / Self Care | Payer: Medicare Other | Source: Ambulatory Visit | Attending: Adult Health | Admitting: Adult Health

## 2023-04-09 ENCOUNTER — Other Ambulatory Visit: Payer: Self-pay | Admitting: Adult Health

## 2023-04-09 DIAGNOSIS — I639 Cerebral infarction, unspecified: Secondary | ICD-10-CM

## 2023-04-09 DIAGNOSIS — H539 Unspecified visual disturbance: Secondary | ICD-10-CM

## 2023-04-09 DIAGNOSIS — G43009 Migraine without aura, not intractable, without status migrainosus: Secondary | ICD-10-CM

## 2023-04-09 MED ORDER — IOPAMIDOL (ISOVUE-370) INJECTION 76%
75.0000 mL | Freq: Once | INTRAVENOUS | Status: AC | PRN
  Administered 2023-04-09: 75 mL via INTRAVENOUS

## 2023-04-10 ENCOUNTER — Telehealth: Payer: Self-pay | Admitting: Adult Health

## 2023-04-10 NOTE — Telephone Encounter (Signed)
Pt is asking for a call to discuss results to her CTA head/neck results , pt declined reviewing via my chart, please call pt

## 2023-04-10 NOTE — Telephone Encounter (Signed)
Called the patient back and reviewed the results with her In detail. Advised I would send the report from the CTA to her PCP. Pt verbalized understanding. Pt had no questions at this time but was encouraged to call back if questions arise.

## 2023-04-12 ENCOUNTER — Telehealth: Payer: Self-pay | Admitting: Nurse Practitioner

## 2023-04-12 NOTE — Telephone Encounter (Signed)
Please advise 

## 2023-04-12 NOTE — Telephone Encounter (Signed)
Patient saw neurologist and he wants her to f/u with PCP.  Patient wanted to talk to MMM about this.  Please call.

## 2023-04-13 ENCOUNTER — Other Ambulatory Visit: Payer: Self-pay | Admitting: Nurse Practitioner

## 2023-04-13 DIAGNOSIS — F321 Major depressive disorder, single episode, moderate: Secondary | ICD-10-CM

## 2023-04-13 DIAGNOSIS — F411 Generalized anxiety disorder: Secondary | ICD-10-CM

## 2023-04-13 NOTE — Telephone Encounter (Signed)
Just needs to make an appointment

## 2023-04-13 NOTE — Telephone Encounter (Signed)
Called and spoke with patient. Moved her follow up appt sooner so we can discuss CT results

## 2023-04-16 ENCOUNTER — Ambulatory Visit (INDEPENDENT_AMBULATORY_CARE_PROVIDER_SITE_OTHER): Payer: Medicare Other | Admitting: Nurse Practitioner

## 2023-04-16 ENCOUNTER — Encounter: Payer: Self-pay | Admitting: Nurse Practitioner

## 2023-04-16 VITALS — BP 140/82 | HR 82 | Temp 97.1°F | Resp 20 | Ht 64.0 in | Wt 141.0 lb

## 2023-04-16 DIAGNOSIS — M8588 Other specified disorders of bone density and structure, other site: Secondary | ICD-10-CM | POA: Diagnosis not present

## 2023-04-16 DIAGNOSIS — R079 Chest pain, unspecified: Secondary | ICD-10-CM | POA: Diagnosis not present

## 2023-04-16 DIAGNOSIS — I10 Essential (primary) hypertension: Secondary | ICD-10-CM

## 2023-04-16 DIAGNOSIS — K219 Gastro-esophageal reflux disease without esophagitis: Secondary | ICD-10-CM

## 2023-04-16 DIAGNOSIS — F411 Generalized anxiety disorder: Secondary | ICD-10-CM

## 2023-04-16 DIAGNOSIS — I6930 Unspecified sequelae of cerebral infarction: Secondary | ICD-10-CM | POA: Diagnosis not present

## 2023-04-16 DIAGNOSIS — E785 Hyperlipidemia, unspecified: Secondary | ICD-10-CM

## 2023-04-16 DIAGNOSIS — F321 Major depressive disorder, single episode, moderate: Secondary | ICD-10-CM

## 2023-04-16 LAB — MICROSCOPIC EXAMINATION
Renal Epithel, UA: NONE SEEN /hpf
WBC, UA: >30 /hpf — AB (ref 0–5)
Yeast, UA: NONE SEEN

## 2023-04-16 LAB — URINALYSIS, COMPLETE
Bilirubin, UA: NEGATIVE
Glucose, UA: NEGATIVE
Nitrite, UA: NEGATIVE
Specific Gravity, UA: 1.025 (ref 1.005–1.030)
Urobilinogen, Ur: 1 mg/dL (ref 0.2–1.0)
pH, UA: 6.5 (ref 5.0–7.5)

## 2023-04-16 MED ORDER — ESCITALOPRAM OXALATE 20 MG PO TABS
20.0000 mg | ORAL_TABLET | Freq: Every day | ORAL | 1 refills | Status: DC
Start: 2023-04-16 — End: 2023-10-23

## 2023-04-16 MED ORDER — BUSPIRONE HCL 5 MG PO TABS
5.0000 mg | ORAL_TABLET | Freq: Two times a day (BID) | ORAL | 1 refills | Status: DC | PRN
Start: 2023-04-16 — End: 2023-10-23

## 2023-04-16 MED ORDER — DOXYCYCLINE HYCLATE 100 MG PO TABS: 100.0000 mg | ORAL_TABLET | Freq: Two times a day (BID) | ORAL | 0 refills | Status: DC

## 2023-04-16 MED ORDER — ROSUVASTATIN CALCIUM 20 MG PO TABS
20.0000 mg | ORAL_TABLET | Freq: Every day | ORAL | 1 refills | Status: DC
Start: 2023-04-16 — End: 2023-10-05

## 2023-04-16 MED ORDER — LOSARTAN POTASSIUM 100 MG PO TABS: 100.0000 mg | ORAL_TABLET | Freq: Every day | ORAL | 1 refills | Status: DC

## 2023-04-16 MED ORDER — AMLODIPINE BESYLATE 2.5 MG PO TABS: 2.5000 mg | ORAL_TABLET | Freq: Every evening | ORAL | 1 refills | Status: DC

## 2023-04-16 MED ORDER — PANTOPRAZOLE SODIUM 40 MG PO TBEC
40.0000 mg | DELAYED_RELEASE_TABLET | Freq: Every day | ORAL | 1 refills | Status: DC
Start: 2023-04-16 — End: 2023-07-03

## 2023-04-16 NOTE — Progress Notes (Signed)
Subjective:    Patient ID: Christina Silva, female    DOB: 11-12-1932, 89 y.o.   MRN: [id number]   Chief Complaint: medical management of chronic issues     HPI:  Christina Silva is a 89 y.o. who identifies as a female who was assigned female at birth.   Social history: Lives with: by herself Work history: retired   Water engineer in today for follow up of the following chronic medical issues:  1. Primary hypertension No c/o chest pain, sob or headache. Does not check blood pressure at home. BP Readings from Last 3 Encounters:  03/14/23 112/65  01/19/23 (!) 153/69  01/16/23 (!) 155/70      2. Hyperlipidemia with target LDL less than 100 Does try to watch diet and stays active. Does no dedicated exercise. Lab Results  Component Value Date   CHOL 149 10/23/2022   HDL 54 10/23/2022   LDLCALC 76 10/23/2022   TRIG 106 10/23/2022   CHOLHDL 2.8 10/23/2022     3. Late effect of cerebrovascular accident (CVA) No permanent effects. Is on daily baby aspirin.  4. Gastroesophageal reflux disease without esophagitis Is on protonix daily and is doing well.  5. Anxiety state Is on buspar BID and that is working well.    04/16/2023    3:03 PM 01/16/2023   11:20 AM 10/23/2022   10:07 AM 07/28/2022   11:17 AM  GAD 7 : Generalized Anxiety Score  Nervous, Anxious, on Edge 0 0 1 1  Control/stop worrying 0 0 1 1  Worry too much - different things 0 0 1 1  Trouble relaxing 0 0 0 1  Restless 0 0 0 0  Easily annoyed or irritable 0 0 0 0  Afraid - awful might happen 0 0 0 1  Total GAD 7 Score 0 0 3 5  Anxiety Difficulty Not difficult at all Not difficult at all Somewhat difficult Not difficult at all      6. Depression, major, single episode, moderate (HCC) Is on lexapro and is doing well. Worries about her health    04/16/2023    3:02 PM 01/16/2023   11:20 AM 10/23/2022   10:07 AM  Depression screen PHQ 2/9  Decreased Interest 0 0 0  Down, Depressed, Hopeless 0 0 0  PHQ - 2  Score 0 0 0  Altered sleeping 1 1 0  Tired, decreased energy 0 3 1  Change in appetite 0 0 0  Feeling bad or failure about yourself  0 0 0  Trouble concentrating 0 2 0  Moving slowly or fidgety/restless 0 0 0  Suicidal thoughts 0 0 0  PHQ-9 Score 1 6 1   Difficult doing work/chores Not difficult at all Not difficult at all Somewhat difficult     7. Osteopenia of spine Last dexascan was done on    New complaints: C/o chest pain intermittent for 2-3 weeks. Tums usually helps. Rates pain 6-7/10. Denies any SOB or sweating.  Allergies  Allergen Reactions   Codeine Nausea And Vomiting   Lisinopril Swelling   Morphine Nausea And Vomiting   Other    Sulfa Antibiotics Nausea Only and Rash   Sulfonamide Derivatives Nausea Only and Rash   Outpatient Encounter Medications as of 04/16/2023  Medication Sig   acetaminophen (TYLENOL) 325 MG tablet Take 650 mg by mouth every 6 (six) hours as needed for mild pain.   amLODipine (NORVASC) 2.5 MG tablet TAKE 1 TABLET BY MOUTH EVERY EVENING   aspirin  EC 81 MG tablet Take 81 mg by mouth daily. Swallow whole.   busPIRone (BUSPAR) 5 MG tablet TAKE ONE TABLET BY MOUTH TWICE DAILY AS NEEDED   cholecalciferol (VITAMIN D) 25 MCG (1000 UNIT) tablet Take 2,000 Units by mouth daily.   escitalopram (LEXAPRO) 20 MG tablet TAKE ONE (1) TABLET BY MOUTH EVERY DAY   HYDROcodone-acetaminophen (NORCO/VICODIN) 5-325 MG tablet Take 1 tablet by mouth every 6 (six) hours as needed. (Patient not taking: Reported on 03/14/2023)   ibuprofen (ADVIL) 200 MG tablet Take 200 mg by mouth every 6 (six) hours as needed for mild pain.   losartan (COZAAR) 100 MG tablet TAKE ONE (1) TABLET BY MOUTH EVERY DAY   pantoprazole (PROTONIX) 40 MG tablet Take 1 tablet (40 mg total) by mouth daily.   rosuvastatin (CRESTOR) 20 MG tablet TAKE ONE (1) TABLET BY MOUTH EVERY DAY   No facility-administered encounter medications on file as of 04/16/2023.    Past Surgical History:  Procedure  Laterality Date   ABDOMINAL HYSTERECTOMY  1980   CATARACT EXTRACTION Right    CHOLECYSTECTOMY N/A 12/16/2019   Procedure: LAPAROSCOPIC CHOLECYSTECTOMY WITH INTRAOPERATIVE CHOLANGIOGRAM;  Surgeon: Harriette Bouillon, MD;  Location: [facility] OR;  Service: General;  Laterality: N/A;   EYE SURGERY     implantable loop recorder placement  11/12/2020    Medtronic Reveal Linq model [id number] implantable loop recorder (SN [id number]) implanted by Dr Johney Frame for cryptogenic stroke   KNEE ARTHROSCOPY Left    ROTATOR CUFF REPAIR Left    SQUAMOUS CELL CARCINOMA EXCISION Left    TONSILLECTOMY      Family History  Problem Relation Age of Onset   Heart attack Mother    Hyperlipidemia Mother    Heart disease Mother    Transient ischemic attack Father    Stroke Father    Psychiatric Illness Sister    Heart disease Sister    Arthritis Brother    Epilepsy Brother    Colon cancer Neg Hx    Esophageal cancer Neg Hx    Rectal cancer Neg Hx    Stomach cancer Neg Hx    Pancreatic cancer Neg Hx       Controlled substance contract: n/a     Review of Systems  Constitutional:  Negative for diaphoresis.  Eyes:  Negative for pain.  Respiratory:  Negative for shortness of breath.   Cardiovascular:  Positive for chest pain. Negative for palpitations and leg swelling.  Gastrointestinal:  Negative for abdominal pain.  Endocrine: Negative for polydipsia.  Skin:  Negative for rash.  Neurological:  Negative for dizziness, weakness and headaches.  Hematological:  Does not bruise/bleed easily.  All other systems reviewed and are negative.      Objective:   Physical Exam Vitals and nursing note reviewed.  Constitutional:      General: She is not in acute distress.    Appearance: Normal appearance. She is well-developed.  HENT:     Head: Normocephalic.     Right Ear: Tympanic membrane normal.     Left Ear: Tympanic membrane normal.     Nose: Nose normal.     Mouth/Throat:     Mouth: Mucous membranes are  moist.  Eyes:     Pupils: Pupils are equal, round, and reactive to light.  Neck:     Vascular: No carotid bruit or JVD.  Cardiovascular:     Rate and Rhythm: Normal rate and regular rhythm.     Heart sounds: Normal heart sounds.  Pulmonary:  Effort: Pulmonary effort is normal. No respiratory distress.     Breath sounds: Normal breath sounds. No wheezing or rales.  Chest:     Chest wall: No tenderness.  Abdominal:     General: Bowel sounds are normal. There is no distension or abdominal bruit.     Palpations: Abdomen is soft. There is no hepatomegaly, splenomegaly, mass or pulsatile mass.     Tenderness: There is no abdominal tenderness.  Musculoskeletal:        General: Normal range of motion.     Cervical back: Normal range of motion and neck supple.  Lymphadenopathy:     Cervical: No cervical adenopathy.  Skin:    General: Skin is warm and dry.  Neurological:     Mental Status: She is alert and oriented to person, place, and time.     Deep Tendon Reflexes: Reflexes are normal and symmetric.  Psychiatric:        Behavior: Behavior normal.        Thought Content: Thought content normal.        Judgment: Judgment normal.      BP (!) 140/82   Pulse 82   Temp (!) 97.1 F (36.2 C) (Temporal)   Resp 20   Ht 5\' 4"  (1.626 m)   Wt 141 lb (64 kg)   SpO2 93%   BMI 24.20 kg/m   EKG- Brendolyn Patty, FNP      Assessment & Plan:   Christina Silva comes in today with chief complaint of Medical Management of Chronic Issues   Diagnosis and orders addressed:  1. Primary hypertension Low sodium diet - amLODipine (NORVASC) 2.5 MG tablet; Take 1 tablet (2.5 mg total) by mouth every evening.  Dispense: 90 tablet; Refill: 1 - losartan (COZAAR) 100 MG tablet; Take 1 tablet (100 mg total) by mouth daily.  Dispense: 90 tablet; Refill: 1  2. Hyperlipidemia with target LDL less than 100 Low fat diet - rosuvastatin (CRESTOR) 20 MG tablet; Take 1 tablet (20 mg total)  by mouth daily.  Dispense: 90 tablet; Refill: 1  3. Late effect of cerebrovascular accident (CVA) Increase to full baby aspirin  4. Gastroesophageal reflux disease without esophagitis Avoid spicy foods Do not eat 2 hours prior to bedtime - pantoprazole (PROTONIX) 40 MG tablet; Take 1 tablet (40 mg total) by mouth daily.  Dispense: 90 tablet; Refill: 1  5. Anxiety state Stress management - busPIRone (BUSPAR) 5 MG tablet; Take 1 tablet (5 mg total) by mouth 2 (two) times daily as needed.  Dispense: 180 tablet; Refill: 1  6. Depression, major, single episode, moderate (HCC) Stress management - escitalopram (LEXAPRO) 20 MG tablet; Take 1 tablet (20 mg total) by mouth daily.  Dispense: 90 tablet; Refill: 1  7. Osteopenia of spine Weight bearing exercises  8. Chest pain, unspecified type - EKG 12-Lead   Labs pending Health Maintenance reviewed Diet and exercise encouraged  Follow up plan: 6 month   Mary-Margaret Daphine Deutscher, FNP

## 2023-04-16 NOTE — Addendum Note (Signed)
Addended by: Cleda Daub on: 04/16/2023 03:52 PM   Modules accepted: Orders

## 2023-04-16 NOTE — Addendum Note (Signed)
Addended by: Bennie Pierini on: 04/16/2023 04:28 PM   Modules accepted: Orders

## 2023-04-17 LAB — CMP14+EGFR
ALT: 14 IU/L (ref 0–32)
AST: 22 IU/L (ref 0–40)
Albumin: 4.4 g/dL (ref 3.7–4.7)
Alkaline Phosphatase: 79 IU/L (ref 44–121)
BUN/Creatinine Ratio: 19 (ref 12–28)
BUN: 14 mg/dL (ref 8–27)
Bilirubin Total: 0.6 mg/dL (ref 0.0–1.2)
CO2: 25 mmol/L (ref 20–29)
Calcium: 9.9 mg/dL (ref 8.7–10.3)
Chloride: 104 mmol/L (ref 96–106)
Creatinine, Ser: 0.73 mg/dL (ref 0.57–1.00)
Globulin, Total: 2 g/dL (ref 1.5–4.5)
Glucose: 95 mg/dL (ref 70–99)
Potassium: 4.2 mmol/L (ref 3.5–5.2)
Sodium: 142 mmol/L (ref 134–144)
Total Protein: 6.4 g/dL (ref 6.0–8.5)
eGFR: 79 mL/min/{1.73_m2} (ref >59)

## 2023-04-17 LAB — CBC WITH DIFFERENTIAL/PLATELET
Basophils Absolute: 0 10*3/uL (ref 0.0–0.2)
Basos: 1 %
EOS (ABSOLUTE): 0.1 10*3/uL (ref 0.0–0.4)
Eos: 2 %
Hematocrit: 42.3 % (ref 34.0–46.6)
Hemoglobin: 13.7 g/dL (ref 11.1–15.9)
Immature Grans (Abs): 0 10*3/uL (ref 0.0–0.1)
Immature Granulocytes: 0 %
Lymphocytes Absolute: 1.7 10*3/uL (ref 0.7–3.1)
Lymphs: 25 %
MCH: 29.6 pg (ref 26.6–33.0)
MCHC: 32.4 g/dL (ref 31.5–35.7)
MCV: 91 fL (ref 79–97)
Monocytes Absolute: 0.6 10*3/uL (ref 0.1–0.9)
Monocytes: 8 %
Neutrophils Absolute: 4.4 10*3/uL (ref 1.4–7.0)
Neutrophils: 64 %
Platelets: 265 10*3/uL (ref 150–450)
RBC: 4.63 x10E6/uL (ref 3.77–5.28)
RDW: 11.6 % — ABNORMAL LOW (ref 11.7–15.4)
WBC: 6.7 10*3/uL (ref 3.4–10.8)

## 2023-04-17 LAB — LIPID PANEL
Chol/HDL Ratio: 2.9 ratio (ref 0.0–4.4)
Cholesterol, Total: 163 mg/dL (ref 100–199)
HDL: 57 mg/dL (ref >39)
LDL Chol Calc (NIH): 81 mg/dL (ref 0–99)
Triglycerides: 143 mg/dL (ref 0–149)
VLDL Cholesterol Cal: 25 mg/dL (ref 5–40)

## 2023-04-23 ENCOUNTER — Ambulatory Visit: Payer: Medicare Other | Admitting: Nurse Practitioner

## 2023-05-15 ENCOUNTER — Encounter: Payer: Self-pay | Admitting: Family

## 2023-05-15 ENCOUNTER — Ambulatory Visit (INDEPENDENT_AMBULATORY_CARE_PROVIDER_SITE_OTHER): Payer: Medicare Other | Admitting: Family

## 2023-05-15 VITALS — BP 134/66 | HR 66 | Temp 97.7°F | Ht 64.0 in | Wt 142.4 lb

## 2023-05-15 DIAGNOSIS — R079 Chest pain, unspecified: Secondary | ICD-10-CM | POA: Diagnosis not present

## 2023-05-15 DIAGNOSIS — H8110 Benign paroxysmal vertigo, unspecified ear: Secondary | ICD-10-CM | POA: Diagnosis not present

## 2023-05-15 DIAGNOSIS — Z23 Encounter for immunization: Secondary | ICD-10-CM

## 2023-05-15 MED ORDER — MECLIZINE HCL 25 MG PO TABS
25.0000 mg | ORAL_TABLET | Freq: Three times a day (TID) | ORAL | 0 refills | Status: DC | PRN
Start: 2023-05-15 — End: 2023-06-12

## 2023-05-15 NOTE — Patient Instructions (Signed)
Benign Positional Vertigo Vertigo is the feeling that you or your surroundings are moving when they are not. Benign positional vertigo is the most common form of vertigo. This is usually a harmless condition (benign). This condition is positional. This means that symptoms are triggered by certain movements and positions. This condition can be dangerous if it occurs while you are doing something that could cause harm to yourself or others. This includes activities such as driving or operating machinery. What are the causes? The inner ear has fluid-filled canals that help your brain sense movement and balance. When the fluid moves, the brain receives messages about your body's position. With benign positional vertigo, calcium crystals in the inner ear break free and disturb the inner ear area. This causes your brain to receive confusing messages about your body's position. What increases the risk? You are more likely to develop this condition if: You are a woman. You are 87 years of age or older. You have recently had a head injury. You have an inner ear disease. What are the signs or symptoms? Symptoms of this condition usually happen when you move your head or your eyes in different directions. Symptoms may start suddenly and usually last for less than a minute. They include: Loss of balance and falling. Feeling like you are spinning or moving. Feeling like your surroundings are spinning or moving. Nausea and vomiting. Blurred vision. Dizziness. Involuntary eye movement (nystagmus). Symptoms can be mild and cause only minor problems, or they can be severe and interfere with daily life. Episodes of benign positional vertigo may return (recur) over time. Symptoms may also improve over time. How is this diagnosed? This condition may be diagnosed based on: Your medical history. A physical exam of the head, neck, and ears. Positional tests to check for or stimulate vertigo. You may be asked to  turn your head and change positions, such as going from sitting to lying down. A health care provider will watch for symptoms of vertigo. You may be referred to a health care provider who specializes in ear, nose, and throat problems (ENT or otolaryngologist) or a provider who specializes in disorders of the nervous system (neurologist). How is this treated?  This condition may be treated in a session in which your health care provider moves your head in specific positions to help the displaced crystals in your inner ear move. Treatment for this condition may take several sessions. Surgery may be needed in severe cases, but this is rare. In some cases, benign positional vertigo may resolve on its own in 2-4 weeks. Follow these instructions at home: Safety Move slowly. Avoid sudden body or head movements or certain positions, as told by your health care provider. Avoid driving or operating machinery until your health care provider says it is safe. Avoid doing any tasks that would be dangerous to you or others if vertigo occurs. If you have trouble walking or keeping your balance, try using a cane for stability. If you feel dizzy or unstable, sit down right away. Return to your normal activities as told by your health care provider. Ask your health care provider what activities are safe for you. General instructions Take over-the-counter and prescription medicines only as told by your health care provider. Drink enough fluid to keep your urine pale yellow. Keep all follow-up visits. This is important. Contact a health care provider if: You have a fever. Your condition gets worse or you develop new symptoms. Your family or friends notice any behavioral changes.  You have nausea or vomiting that gets worse. You have numbness or a prickling and tingling sensation. Get help right away if you: Have difficulty speaking or moving. Are always dizzy or faint. Develop severe headaches. Have weakness in  your legs or arms. Have changes in your hearing or vision. Develop a stiff neck. Develop sensitivity to light. These symptoms may represent a serious problem that is an emergency. Do not wait to see if the symptoms will go away. Get medical help right away. Call your local emergency services (911 in the [place] Do not drive yourself to the hospital. Summary Vertigo is the feeling that you or your surroundings are moving when they are not. Benign positional vertigo is the most common form of vertigo. This condition is caused by calcium crystals in the inner ear that become displaced. This causes a disturbance in an area of the inner ear that helps your brain sense movement and balance. Symptoms include loss of balance and falling, feeling that you or your surroundings are moving, nausea and vomiting, and blurred vision. This condition can be diagnosed based on symptoms, a physical exam, and positional tests. Follow safety instructions as told by your health care provider and keep all follow-up visits. This is important. This information is not intended to replace advice given to you by your health care provider. Make sure you discuss any questions you have with your health care provider. Document Revised: 07/14/2020 Document Reviewed: 07/14/2020 Elsevier Patient Education  2024 [employer].

## 2023-05-15 NOTE — Progress Notes (Signed)
Subjective:    Patient ID: Christina Silva, female    DOB: Jan 04, 1933, 89 y.o.   MRN: [id number]  Chief Complaint  Patient presents with   Dizziness   left arm pain   Nausea   PT presents to the office today with dizziness when she moves her head, turning her head, or rolling in bed.  Dizziness This is a recurrent problem. The current episode started in the past 7 days. The problem occurs intermittently. Associated symptoms include nausea. The symptoms are aggravated by twisting and bending. She has tried rest for the symptoms. The treatment provided mild relief.  Hypertension This is a chronic problem. The current episode started more than 1 year ago. The problem has been resolved since onset. The problem is controlled. Pertinent negatives include no malaise/fatigue, peripheral edema or shortness of breath. The current treatment provides moderate improvement.      Review of Systems  Constitutional:  Negative for malaise/fatigue.  Respiratory:  Negative for shortness of breath.   Gastrointestinal:  Positive for nausea.  Neurological:  Positive for dizziness.  All other systems reviewed and are negative.      Objective:   Physical Exam Vitals reviewed.  Constitutional:      General: She is not in acute distress.    Appearance: She is well-developed.  HENT:     Head: Normocephalic and atraumatic.     Right Ear: Tympanic membrane normal.     Left Ear: Tympanic membrane normal.  Eyes:     Pupils: Pupils are equal, round, and reactive to light.  Neck:     Thyroid: No thyromegaly.  Cardiovascular:     Rate and Rhythm: Normal rate and regular rhythm.     Heart sounds: Normal heart sounds. No murmur heard. Pulmonary:     Effort: Pulmonary effort is normal. No respiratory distress.     Breath sounds: Normal breath sounds. No wheezing.  Abdominal:     General: Bowel sounds are normal. There is no distension.     Palpations: Abdomen is soft.     Tenderness: There is no  abdominal tenderness.  Musculoskeletal:        General: No tenderness.     Cervical back: Normal range of motion and neck supple.  Skin:    General: Skin is warm and dry.  Neurological:     Mental Status: She is alert and oriented to person, place, and time.     Cranial Nerves: No cranial nerve deficit.     Motor: Weakness present.     Gait: Gait abnormal.     Deep Tendon Reflexes: Reflexes are normal and symmetric.     Comments: Left sided weakness  Psychiatric:        Behavior: Behavior normal.        Thought Content: Thought content normal.        Judgment: Judgment normal.       BP 134/66   Pulse 66   Temp 97.7 F (36.5 C) (Temporal)   Ht 5\' 4"  (1.626 m)   Wt 142 lb 6.4 oz (64.6 kg)   SpO2 93%   BMI 24.44 kg/m      Assessment & Plan:  Christina Silva comes in today with chief complaint of Dizziness, left arm pain, and Nausea   Diagnosis and orders addressed:  1. Chest pain, unspecified type - EKG 12-Lead  2. Encounter for immunization - Flu Vaccine Trivalent High Dose (Fluad)  3. Benign paroxysmal positional vertigo, unspecified laterality Avoid  position changes  Anvitvert as needed  Force fluids  Fall precautions discussed  Epley exercises discussed- Handout given  - meclizine (ANTIVERT) 25 MG tablet; Take 1 tablet (25 mg total) by mouth 3 (three) times daily as needed for dizziness.  Dispense: 60 tablet; Refill: 0   Labs reviewed  Health Maintenance reviewed Diet and exercise encouraged  Follow up plan: Keep chronic follow up with PCP and Cardiologists.   Jannifer Rodney, FNP

## 2023-05-17 ENCOUNTER — Telehealth: Payer: Self-pay | Admitting: Family Medicine

## 2023-05-17 DIAGNOSIS — H8110 Benign paroxysmal vertigo, unspecified ear: Secondary | ICD-10-CM

## 2023-05-17 NOTE — Telephone Encounter (Signed)
Referral to ENT placed

## 2023-06-07 ENCOUNTER — Telehealth: Payer: Self-pay | Admitting: Family Medicine

## 2023-06-08 NOTE — Telephone Encounter (Signed)
Information was sent to Patient via MyChart.

## 2023-06-11 NOTE — Progress Notes (Unsigned)
Cardiology Office Note:    Date:  06/12/2023  ID:  [name], DOB [birth year], MRN [id number] PCP: Bennie Pierini, FNP  [facility] Providers Cardiologist:  Peter [place], MD       Patient Profile:     Hyperlipidemia Hypertension CVA Anxiety SVT Cardiac Monitior 11/06/2022: NSR, several brief runs of nonsustained SVT (longest 15 seconds), otherwise rare ectopy      History of Present Illness:  Discussed the use of AI scribe software for clinical note transcription with the patient, who gave verbal consent to proceed.  Christina Silva is a 88 y.o. female who returns for 39-month follow-up for palpitations/SVT, hypertension, hyperlipidemia.  Established care 5/19 for blood pressure management and lower extremity edema.  Echocardiogram at the time showed EF 55 to 60%, grade 1 diastolic dysfunction, mild AI, mild to moderate MR and TR.  History cryptogenic CVA on 2/20, placed on aspirin.  TIA on 7/20, Plavix was started.  Brain MRI 1/22 with new right posterior cerebral artery occlusion, implantable loop recorder was placed.  Loop recorder was turned off on 4/23 due to concerns about continued remote monitoring.  Was seen by Victorino Dike, NP on 2/24 with a complaint of intermittent palpitations.  A 3-day cardiac event monitor was ordered that showed episodes of SVT.  Her amlodipine was discontinued in Diltiazem 120 mg daily was ordered.  On 4/24 was seen by Verdon Cummins, NP for follow-up evaluation for palpitations.  Cardiac event monitor was reviewed, she was doing well from a cardiac standpoint.  She did not start the diltiazem 120mg , she rather decided to wait to review her monitor and discuss the medication on her follow-up visit.  During this time she wished to not start diltiazem and would rather avoid triggers for palpitations.  Today she is doing well overall.  She does report that the episodes of palpitations have continued.  Palpitations typically only occur at night,  lasting less than 15 seconds.  She notes she has avoided caffeine, alcohol, dehydration.  She does note eating quite a bit of chocolate, but will begin to reduce the amount of chocolate that she eats every day.  She denied any aggravating or alleviating symptoms.  She notes that she is still very independent and active at home.  She does note intermittent dizziness, was seen by PCP last month and diagnosed with vertigo, started on Antivert 25 mg as needed.  No chest pain.  She denies chest pain, shortness of breath, lower extremity edema, fatigue, palpitations, melena, hematuria, hemoptysis, diaphoresis, weakness, presyncope, syncope, orthopnea, and PND.            Review of Systems  Constitutional: Negative for weight gain and weight loss.  Cardiovascular:  Positive for palpitations. Negative for chest pain, claudication, dyspnea on exertion, leg swelling, near-syncope, orthopnea, paroxysmal nocturnal dyspnea and syncope.  Respiratory:  Negative for cough, hemoptysis, shortness of breath and sleep disturbances due to breathing.   Gastrointestinal:  Positive for heartburn. Negative for abdominal pain, hematemesis, hematochezia and melena.  Genitourinary:  Negative for hematuria.  Neurological:  Positive for dizziness. Negative for light-headedness.     See HPI     Studies Reviewed:       Cardiac event monitor 11/06/2022  Patient had a min HR of 46 bpm, max HR of 207 bpm, and avg HR of 71 bpm. Predominant underlying rhythm was Sinus Rhythm. 30 Supraventricular Tachycardia runs occurred, the run with the fastest interval lasting 8 beats with a max rate of 207 bpm, the  longest lasting 15.3 secs with an avg rate of 122 bpm. Isolated SVEs were rare (<1.0%), SVE Couplets were rare (<1.0%), and SVE Triplets were rare (<1.0%). Isolated VEs were rare (<1.0%, 431), VE Couplets were rare (<1.0%, 1), and VE Triplets were rare  (<1.0%, 2).   Risk Assessment/Calculations:             Physical  Exam:   VS:  BP 136/72 (BP Location: Left Arm, Patient Position: Sitting, Cuff Size: Normal)   Pulse 73   Ht 5\' 4"  (1.626 m)   Wt 144 lb 3.2 oz (65.4 kg)   SpO2 92%   BMI 24.75 kg/m    Wt Readings from Last 3 Encounters:  06/12/23 144 lb 3.2 oz (65.4 kg)  05/15/23 142 lb 6.4 oz (64.6 kg)  04/16/23 141 lb (64 kg)    Constitutional:      Appearance: Normal and healthy appearance.  Neck:     Vascular: JVD normal.  Pulmonary:     Effort: Pulmonary effort is normal.     Breath sounds: Normal breath sounds.  Chest:     Chest wall: Not tender to palpatation.  Cardiovascular:     PMI at left midclavicular line. Normal rate. Regular rhythm. Normal S1. Normal S2.      Murmurs: There is no murmur.     No gallop.  No click. No rub.  Pulses:    Intact distal pulses.  Edema:    Peripheral edema absent.  Musculoskeletal:     Cervical back: Normal range of motion and neck supple. Skin:    General: Skin is warm.  Neurological:     General: No focal deficit present.     Mental Status: Oriented to person, place and time.  Psychiatric:        Behavior: Behavior is cooperative.        Assessment and Plan:  SVT/palpitations -Cardiac event monitor on 10/2022 showing episodes of SVT -Originally wanted to try to reduce triggers, however palpitations continued -Plan to start Diltiazem 120mg  once daily.  Patient agreeable to start -Discontinue amlodipine 2.5 mg -Keep log of blood pressure, contact office if blood pressure stays above 140/90. -Continue to avoid caffeine, chocolate, alcohol, dehydration -Recent CBC, BMET, TSH, magnesium all within normal limits  2.  Hypertension -Blood pressure today 136/72 -Maintain blood pressure log -Continue losartan 100 mg once daily -Continue heart healthy, low-sodium diet  3.  Hyperlipidemia -LDL 81 on 04/16/2023 -Controlled continue rosuvastatin 20 mg once daily  4.  Cryptogenic stroke -S/p loop recorder that was previously turned off                  Dispo:  Return in about 4 months (around 10/13/2023).  Signed, Denyce Robert, AGNP-C

## 2023-06-12 ENCOUNTER — Encounter: Payer: Self-pay | Admitting: General Practice

## 2023-06-12 ENCOUNTER — Other Ambulatory Visit: Payer: Self-pay | Admitting: Family

## 2023-06-12 ENCOUNTER — Ambulatory Visit: Payer: Medicare Other | Attending: General Practice | Admitting: Emergency Medicine

## 2023-06-12 VITALS — BP 136/72 | HR 73 | Ht 64.0 in | Wt 144.2 lb

## 2023-06-12 DIAGNOSIS — I10 Essential (primary) hypertension: Secondary | ICD-10-CM

## 2023-06-12 DIAGNOSIS — H8110 Benign paroxysmal vertigo, unspecified ear: Secondary | ICD-10-CM

## 2023-06-12 DIAGNOSIS — I639 Cerebral infarction, unspecified: Secondary | ICD-10-CM | POA: Diagnosis not present

## 2023-06-12 DIAGNOSIS — R002 Palpitations: Secondary | ICD-10-CM | POA: Diagnosis not present

## 2023-06-12 DIAGNOSIS — I471 Supraventricular tachycardia, unspecified: Secondary | ICD-10-CM | POA: Diagnosis not present

## 2023-06-12 DIAGNOSIS — E785 Hyperlipidemia, unspecified: Secondary | ICD-10-CM

## 2023-06-12 MED ORDER — DILTIAZEM HCL ER COATED BEADS 120 MG PO CP24: 120.0000 mg | ORAL_CAPSULE | Freq: Every day | ORAL | 3 refills | Status: DC

## 2023-06-12 NOTE — Patient Instructions (Signed)
Medication Instructions:  STOP AMLODIPINE TAKE CAREDIZEM (DILTIAZEM) 120MG  DAILY *If you need a refill on your cardiac medications before your next appointment, please call your pharmacy*  Lab Work: NONE  Other Instructions DECREASE CHOCOLATE INTAKE INCREASE WATER INTAKE  TAKE AND LOG YOUR BLOOD PRESSURE 3-4 TIMES WEEKLY IF YOUR BLOOD PRESSURE IS >140/90 CALL AND LET us KNOW  Follow-Up: At [facility], you and your health needs are our priority.  As part of our continuing mission to provide you with exceptional heart care, we have created designated Provider Care Teams.  These Care Teams include your primary Cardiologist (physician) and Advanced Practice Providers (APPs -  Physician Assistants and Nurse Practitioners) who all work together to provide you with the care you need, when you need it.  Your next appointment:   3 month(s)  Provider:   Peter Swaziland, MD  or Edd Fabian, FNP or MADISON FOUNTAIN, AGNP-C

## 2023-07-02 ENCOUNTER — Other Ambulatory Visit: Payer: Self-pay | Admitting: Family Medicine

## 2023-07-02 ENCOUNTER — Other Ambulatory Visit: Payer: Self-pay | Admitting: Nurse Practitioner

## 2023-07-02 DIAGNOSIS — H8110 Benign paroxysmal vertigo, unspecified ear: Secondary | ICD-10-CM

## 2023-07-02 DIAGNOSIS — K219 Gastro-esophageal reflux disease without esophagitis: Secondary | ICD-10-CM

## 2023-07-09 ENCOUNTER — Telehealth (INDEPENDENT_AMBULATORY_CARE_PROVIDER_SITE_OTHER): Payer: Self-pay | Admitting: Otolaryngology

## 2023-07-09 NOTE — Telephone Encounter (Signed)
Spoke with patient, confirmed service location

## 2023-07-10 ENCOUNTER — Encounter (INDEPENDENT_AMBULATORY_CARE_PROVIDER_SITE_OTHER): Payer: Self-pay

## 2023-07-10 ENCOUNTER — Ambulatory Visit (INDEPENDENT_AMBULATORY_CARE_PROVIDER_SITE_OTHER): Payer: Medicare Other | Admitting: Otolaryngology

## 2023-07-10 ENCOUNTER — Ambulatory Visit (INDEPENDENT_AMBULATORY_CARE_PROVIDER_SITE_OTHER): Payer: Medicare Other | Admitting: Audiology

## 2023-07-10 VITALS — Ht 64.0 in | Wt 144.0 lb

## 2023-07-10 DIAGNOSIS — H903 Sensorineural hearing loss, bilateral: Secondary | ICD-10-CM

## 2023-07-10 DIAGNOSIS — H8110 Benign paroxysmal vertigo, unspecified ear: Secondary | ICD-10-CM

## 2023-07-10 DIAGNOSIS — R42 Dizziness and giddiness: Secondary | ICD-10-CM

## 2023-07-10 NOTE — Progress Notes (Signed)
  [address] [place], [place] [address] [phone]  Audiological Evaluation    Name: Christina Silva     DOB:   08-13-1933      MRN:   [id number]                                                                                     Service Date: 07/10/2023        Patient was referred today for a hearing evaluation by Dr. Allena Katz.  Symptoms Yes Details  Hearing loss  [x]  Patient reported perceiving minimal hearing loss.  Tinnitus  []  Patient denied experiencing tinnitus.  Ear pain/ Ear infections  [x]  Patient reported intermittent pain behind her right ear.  Balance problems  [x]  Patient reported intermittent vertigo sensations.  Previous ear surgeries  []  Patient denied any previous ear surgeries.  Family history  []  Patient denied family history of hearing loss.  Amplification  []  Patient denied the use of hearing aids.    Otoscopy: Right ear: Clear external ear canal; notable landmarks visualized on the tympanic membrane. Left ear: Clear external ear canal; notable landmarks visualized on the tympanic membrane.    Tympanogram: Right ear: Normal external ear canal volume with normal middle ear pressure and tympanic membrane compliance (Type A). Left ear: Normal external ear canal volume with normal middle ear pressure and tympanic membrane compliance (Type A).    Hearing Evaluation: The audiogram was completed using conventional audiometric techniques under headphones with good reliability.   The hearing test results indicate: Right ear: Normal hearing sensitivity from [phone] Hz sloping to moderately-severe sensorineural hearing loss from 2000-8000 Hz. Left ear: Normal hearing sensitivity from [phone] Hz sloping to severe sensorineural hearing loss from 2000-8000 Hz.  Speech Audiometry: Right ear- Speech Reception Threshold (SRT) was obtained at 30 dBHL. Left ear- Speech Reception Threshold (SRT) was obtained at 30 dBHL.   Word Recognition Score Tested  using NU-6 (MLV) Right ear: 88% was obtained at a presentation level of 70 dBHL which is deemed as good understanding. Left ear: 88% was obtained at a presentation level of 70 dBHL which is deemed as good understanding.    Impression:  There is not a significant difference between puretone thresholds and word recognition scores between ears.   Recommendations: Repeat audiogram when changes are perceived or per MD. Patient is a candidate for hearing amplification, consider a hearing aid evaluation.   Conley Rolls Keidan Aumiller, AUD, CCC-A 07/10/23

## 2023-07-10 NOTE — Progress Notes (Signed)
Dear Dr. Lendon Colonel, Here is my assessment for our mutual patient, Christina Silva. Thank you for allowing me the opportunity to care for your patient. Please do not hesitate to contact me should you have any other questions. Sincerely, Dr. Jovita Kussmaul  Otolaryngology Clinic Note Referring provider: Dr. Lendon Colonel HPI:  Christina Silva is a 89 y.o. female kindly referred by Dr. Lendon Colonel for evaluation of vertigo, which started about 2-3 months ago.  She reports that she has rare vertigo - associated with head turning (like looking up to put drops in her eyes or turning over in bed). Happens maybe once every two weeks. Happens for a minute or so, and then stops. She has been taking Meclizine but reports it does not help.  She did try Epley prescribed by her PCP and it did help some  She reports some hearing loss lonstanding, but not significant change (just gradually declined) Does not use hearing aids.  Patient denies: ear pain, fullness, drainage. Patient additionally denies: deep pain in ear canal, eustachian tube symptoms such as popping, crackling, sensitive to pressure changes Patient also denies barotrauma, ototoxic medication use Prior ear surgery: no  She does have significant noise exposure from when she was working in the past.  Patient has been Neurology for cryptogenic strokes and migraines. She is also seeing Cardiology for palpitations and lightheadedness, starting on amlodipine.  She has had multiple MRIs  H&N Surgery: no Personal or FHx of bleeding dz or anesthesia difficulty: no  AP/AC: ASA 81 BID  Independent Review of Additional Tests or Records:  MRI (04/2022): mastoids well aerated, no effusion. No obvious large retrocochlear lesions noted   CTH (2023): Mastoids, ME well aertaed. No obvious otic capsule or cochlear bony pathology noted but cuts quite thick   07/10/2023 Audiogram was independently reviewed and interpreted by me and it reveals AU: normal downsloping to  mod/mod-severe SNHL, symmetric; 88% word interpretation at 70dB; type A/A tympanogram    SNHL= Sensorineural hearing loss   PMH/Meds/All/SocHx/FamHx/ROS:   Past Medical History:  Diagnosis Date   Anxiety    Cataract    GERD (gastroesophageal reflux disease)    Hyperlipidemia    Hypertension    SCCA (squamous cell carcinoma) of skin 11/02/2020   Mid Chest (in situ) (tx p bx)   SCCA (squamous cell carcinoma) of skin 11/02/2020   Right Forearm Posterior (in situ) (tx p bx)   SCCA (squamous cell carcinoma) of skin 11/02/2020   Left Submandibular Area (in situ) (tx p bx)   Stroke (HCC) 09/2018   Vertigo    patient denies     Past Surgical History:  Procedure Laterality Date   ABDOMINAL HYSTERECTOMY  1980   CATARACT EXTRACTION Right    CHOLECYSTECTOMY N/A 12/16/2019   Procedure: LAPAROSCOPIC CHOLECYSTECTOMY WITH INTRAOPERATIVE CHOLANGIOGRAM;  Surgeon: Harriette Bouillon, MD;  Location: [facility] OR;  Service: General;  Laterality: N/A;   EYE SURGERY     implantable loop recorder placement  11/12/2020    Medtronic Reveal Linq model [id number] implantable loop recorder (SN [id number]) implanted by Dr Johney Frame for cryptogenic stroke   KNEE ARTHROSCOPY Left    ROTATOR CUFF REPAIR Left    SQUAMOUS CELL CARCINOMA EXCISION Left    TONSILLECTOMY      Family History  Problem Relation Age of Onset   Heart attack Mother    Hyperlipidemia Mother    Heart disease Mother    Transient ischemic attack Father    Stroke Father    Psychiatric Illness Sister  Heart disease Sister    Arthritis Brother    Epilepsy Brother    Colon cancer Neg Hx    Esophageal cancer Neg Hx    Rectal cancer Neg Hx    Stomach cancer Neg Hx    Pancreatic cancer Neg Hx      Social Connections: Moderately Isolated (07/10/2022)   Social Connection and Isolation Panel [NHANES]    Frequency of Communication with Friends and Family: More than three times a week    Frequency of Social Gatherings with Friends and Family: More  than three times a week    Attends Religious Services: 1 to 4 times per year    Active Member of [employer] or Organizations: No    Attends Banker Meetings: Never    Marital Status: Widowed      Current Outpatient Medications:    acetaminophen (TYLENOL) 325 MG tablet, Take 650 mg by mouth every 6 (six) hours as needed for mild pain., Disp: , Rfl:    aspirin EC 81 MG tablet, Take 81 mg by mouth daily. Swallow whole., Disp: , Rfl:    busPIRone (BUSPAR) 5 MG tablet, Take 1 tablet (5 mg total) by mouth 2 (two) times daily as needed., Disp: 180 tablet, Rfl: 1   cholecalciferol (VITAMIN D) 25 MCG (1000 UNIT) tablet, Take 2,000 Units by mouth daily., Disp: , Rfl:    diltiazem (CARDIZEM CD) 120 MG 24 hr capsule, Take 1 capsule (120 mg total) by mouth daily., Disp: 30 capsule, Rfl: 3   escitalopram (LEXAPRO) 20 MG tablet, Take 1 tablet (20 mg total) by mouth daily., Disp: 90 tablet, Rfl: 1   ibuprofen (ADVIL) 200 MG tablet, Take 200 mg by mouth every 6 (six) hours as needed for mild pain., Disp: , Rfl:    losartan (COZAAR) 100 MG tablet, Take 1 tablet (100 mg total) by mouth daily., Disp: 90 tablet, Rfl: 1   meclizine (ANTIVERT) 25 MG tablet, TAKE 1 TABLET BY MOUTH 3 TIMES DAILY AS NEEDED FOR DIZZINESS, Disp: 60 tablet, Rfl: 0   Multiple Vitamin (MULTIVITAMIN) tablet, Take 1 tablet by mouth daily., Disp: , Rfl:    pantoprazole (PROTONIX) 40 MG tablet, TAKE ONE (1) TABLET BY MOUTH EVERY DAY, Disp: 90 tablet, Rfl: 1   Pyridoxine HCl (VITAMIN B6 PO), Take by mouth daily., Disp: , Rfl:    rosuvastatin (CRESTOR) 20 MG tablet, Take 1 tablet (20 mg total) by mouth daily., Disp: 90 tablet, Rfl: 1   doxycycline (VIBRA-TABS) 100 MG tablet, Take 1 tablet (100 mg total) by mouth 2 (two) times daily. 1 po bid (Patient not taking: Reported on 06/12/2023), Disp: 20 tablet, Rfl: 0   HYDROcodone-acetaminophen (NORCO/VICODIN) 5-325 MG tablet, Take 1 tablet by mouth every 6 (six) hours as needed. (Patient not  taking: Reported on 07/10/2023), Disp: 10 tablet, Rfl: 0   Physical Exam:   Ht 5\' 4"  (1.626 m)   Wt 144 lb (65.3 kg)   BMI 24.72 kg/m   Salient findings:  CN II-XII intact, facial sensation intact No gross nystagmus  Bilateral EAC clear and TM intact with well pneumatized middle ear spaces Anterior rhinoscopy: Septum relatively midline; no masses noted No lesions of oral cavity/oropharynx; No obviously palpable neck masses/lymphadenopathy/thyromegaly No respiratory distress or stridor Gait normal Dix hallpike neg  Seprately Identifiable Procedures:  None  Impression & Plans:  Nacy Bath is a 89 y.o. female with history of strokes, SVT, and migraines following neurology and cardiology now with: 1. Vertigo   2.  Benign paroxysmal positional vertigo, unspecified laterality   3. Bilateral sensorineural hearing loss   Intermittent vertigo which based on history appears to be consistent with BPPV. Has tried Epley with some benefit. She does not have significant symptoms. Hearing loss most likely presbycusis. Prior MRIs and CT from otologic standpoint appear reassuring. She does have other non-otologic balance issues as well and migraines for which she follows neuro and cardiology.  Given this, we discussed Rx: Epley maneuvers, formal vestibular rehab or testing. Since this is not significantly bothersome and rare, she would like to forego formal testing or therapy and try Epley at home. I advised her to call back should she have any issues. She was not interested in amplification currently or hearing recheck  I advised her to stop her Meclizine 25mg  PO TID PRN because of the nature of her vertigo.   Follow up with Neurology and cardiology for migraine and her cardiac complaints  - f/u should symptoms persist    Thank you for allowing me the opportunity to care for your patient. Please do not hesitate to contact me should you have any other questions.  Sincerely, Jovita Kussmaul, MD Otolarynoglogist (ENT), [facility] Phone: [phone] Fax: [phone]  07/10/2023, 2:14 PM   MDM:  Level 4 Data complexity: mod - independent review of imaging and tests - Morbidity: mod   - Prescription Drug prescribed or managed: yes - discontinuation of meclizine

## 2023-07-12 ENCOUNTER — Ambulatory Visit: Payer: Medicare Other

## 2023-07-12 VITALS — Ht 64.0 in | Wt 144.0 lb

## 2023-07-12 DIAGNOSIS — Z0000 Encounter for general adult medical examination without abnormal findings: Secondary | ICD-10-CM

## 2023-07-12 NOTE — Progress Notes (Signed)
Subjective:   Christina Silva is a 89 y.o. female who presents for Medicare Annual (Subsequent) preventive examination.  Visit Complete: Virtual I connected with  Christina Silva on 07/12/23 by a audio enabled telemedicine application and verified that I am speaking with the correct person using two identifiers.  Patient Location: Home  Provider Location: Home Office  I discussed the limitations of evaluation and management by telemedicine. The patient expressed understanding and agreed to proceed.  Vital Signs: Because this visit was a virtual/telehealth visit, some criteria may be missing or patient reported. Any vitals not documented were not able to be obtained and vitals that have been documented are patient reported.  Patient Medicare AWV questionnaire was completed by the patient on 07/12/2023; I have confirmed that all information answered by patient is correct and no changes since this date.  Cardiac Risk Factors include: advanced age (>55men, >81 women);dyslipidemia;hypertension     Objective:    Today's Vitals   07/12/23 1302  Weight: 144 lb (65.3 kg)  Height: 5\' 4"  (1.626 m)   Body mass index is 24.72 kg/m.     07/12/2023    1:09 PM 01/19/2023   12:54 PM 01/11/2023    1:40 PM 07/10/2022    2:22 PM 06/28/2021    2:24 PM 06/03/2020   10:13 AM 12/16/2019    1:20 PM  Advanced Directives  Does Patient Have a Medical Advance Directive? Yes No No Yes Yes Yes   Type of Estate agent of [place];Living will   Healthcare Power of [place];Living will Healthcare Power of [place];Living will Healthcare Power of [place];Living will   Does patient want to make changes to medical advance directive?      No - Patient declined   Copy of Healthcare Power of Attorney in Chart? No - copy requested   No - copy requested No - copy requested No - copy requested No - copy requested  Would patient like information on creating a medical advance directive?  No -  Patient declined         Current Medications (verified) Outpatient Encounter Medications as of 07/12/2023  Medication Sig   acetaminophen (TYLENOL) 325 MG tablet Take 650 mg by mouth every 6 (six) hours as needed for mild pain.   aspirin EC 81 MG tablet Take 81 mg by mouth daily. Swallow whole.   busPIRone (BUSPAR) 5 MG tablet Take 1 tablet (5 mg total) by mouth 2 (two) times daily as needed.   cholecalciferol (VITAMIN D) 25 MCG (1000 UNIT) tablet Take 2,000 Units by mouth daily.   diltiazem (CARDIZEM CD) 120 MG 24 hr capsule Take 1 capsule (120 mg total) by mouth daily.   escitalopram (LEXAPRO) 20 MG tablet Take 1 tablet (20 mg total) by mouth daily.   ibuprofen (ADVIL) 200 MG tablet Take 200 mg by mouth every 6 (six) hours as needed for mild pain.   losartan (COZAAR) 100 MG tablet Take 1 tablet (100 mg total) by mouth daily.   Multiple Vitamin (MULTIVITAMIN) tablet Take 1 tablet by mouth daily.   pantoprazole (PROTONIX) 40 MG tablet TAKE ONE (1) TABLET BY MOUTH EVERY DAY   Pyridoxine HCl (VITAMIN B6 PO) Take by mouth daily.   rosuvastatin (CRESTOR) 20 MG tablet Take 1 tablet (20 mg total) by mouth daily.   doxycycline (VIBRA-TABS) 100 MG tablet Take 1 tablet (100 mg total) by mouth 2 (two) times daily. 1 po bid (Patient not taking: Reported on 07/12/2023)   HYDROcodone-acetaminophen (NORCO/VICODIN)  5-325 MG tablet Take 1 tablet by mouth every 6 (six) hours as needed. (Patient not taking: Reported on 07/12/2023)   meclizine (ANTIVERT) 25 MG tablet TAKE 1 TABLET BY MOUTH 3 TIMES DAILY AS NEEDED FOR DIZZINESS (Patient not taking: Reported on 07/12/2023)   No facility-administered encounter medications on file as of 07/12/2023.    Allergies (verified) Codeine, Lisinopril, Morphine, Other, Sulfa antibiotics, and Sulfonamide derivatives   History: Past Medical History:  Diagnosis Date   Anxiety    Cataract    GERD (gastroesophageal reflux disease)    Hyperlipidemia    Hypertension     SCCA (squamous cell carcinoma) of skin 11/02/2020   Mid Chest (in situ) (tx p bx)   SCCA (squamous cell carcinoma) of skin 11/02/2020   Right Forearm Posterior (in situ) (tx p bx)   SCCA (squamous cell carcinoma) of skin 11/02/2020   Left Submandibular Area (in situ) (tx p bx)   Stroke (HCC) 09/2018   Vertigo    patient denies   Past Surgical History:  Procedure Laterality Date   ABDOMINAL HYSTERECTOMY  1980   CATARACT EXTRACTION Right    CHOLECYSTECTOMY N/A 12/16/2019   Procedure: LAPAROSCOPIC CHOLECYSTECTOMY WITH INTRAOPERATIVE CHOLANGIOGRAM;  Surgeon: Harriette Bouillon, MD;  Location: [facility] OR;  Service: General;  Laterality: N/A;   EYE SURGERY     implantable loop recorder placement  11/12/2020    Medtronic Reveal Linq model [id number] implantable loop recorder (SN [id number]) implanted by Dr Johney Frame for cryptogenic stroke   KNEE ARTHROSCOPY Left    ROTATOR CUFF REPAIR Left    SQUAMOUS CELL CARCINOMA EXCISION Left    TONSILLECTOMY     Family History  Problem Relation Age of Onset   Heart attack Mother    Hyperlipidemia Mother    Heart disease Mother    Transient ischemic attack Father    Stroke Father    Psychiatric Illness Sister    Heart disease Sister    Arthritis Brother    Epilepsy Brother    Colon cancer Neg Hx    Esophageal cancer Neg Hx    Rectal cancer Neg Hx    Stomach cancer Neg Hx    Pancreatic cancer Neg Hx    Social History   Socioeconomic History   Marital status: Widowed    Spouse name: Not on file   Number of children: 2   Years of education: 10   Highest education level: Not on file  Occupational History   Occupation: retired  Tobacco Use   Smoking status: Former    Current packs/day: 0.00    Types: Cigarettes    Quit date: 01/09/1983    Years since quitting: 40.5   Smokeless tobacco: Never  Vaping Use   Vaping status: Never Used  Substance and Sexual Activity   Alcohol use: No   Drug use: No   Sexual activity: Yes  Other Topics Concern   Not  on file  Social History Narrative   Lives alone handicap accessible home - daughter in [place] and son in [place].   Right Handed   Drinks caffeine occassionally   Social Determinants of Health   Financial Resource Strain: Low Risk  (07/12/2023)   Overall Financial Resource Strain (CARDIA)    Difficulty of Paying Living Expenses: Not hard at all  Food Insecurity: No Food Insecurity (07/12/2023)   Hunger Vital Sign    Worried About Running Out of Food in the Last Year: Never true    Ran Out of Food in the Last  Year: Never true  Transportation Needs: No Transportation Needs (07/12/2023)   PRAPARE - Administrator, Civil Service (Medical): No    Lack of Transportation (Non-Medical): No  Physical Activity: Insufficiently Active (07/12/2023)   Exercise Vital Sign    Days of Exercise per Week: 3 days    Minutes of Exercise per Session: 30 min  Stress: No Stress Concern Present (07/12/2023)   [employer] of Occupational Health - Occupational Stress Questionnaire    Feeling of Stress : Not at all  Social Connections: Moderately Integrated (07/12/2023)   Social Connection and Isolation Panel [NHANES]    Frequency of Communication with Friends and Family: More than three times a week    Frequency of Social Gatherings with Friends and Family: More than three times a week    Attends Religious Services: 1 to 4 times per year    Active Member of [employer] or Organizations: Yes    Attends Banker Meetings: More than 4 times per year    Marital Status: Widowed    Tobacco Counseling Counseling given: Not Answered   Clinical Intake:  Pre-visit preparation completed: Yes  Pain : No/denies pain     Nutritional Risks: None Diabetes: No  How often do you need to have someone help you when you read instructions, pamphlets, or other written materials from your doctor or pharmacy?: 1 - Never  Interpreter Needed?: No  Information entered by :: Renie Ora, LPN   Activities of Daily Living    07/12/2023    1:10 PM  In your present state of health, do you have any difficulty performing the following activities:  Hearing? 0  Vision? 0  Difficulty concentrating or making decisions? 0  Walking or climbing stairs? 0  Dressing or bathing? 0  Doing errands, shopping? 0  Preparing Food and eating ? N  Using the Toilet? N  In the past six months, have you accidently leaked urine? N  Do you have problems with loss of bowel control? N  Managing your Medications? N  Managing your Finances? N  Housekeeping or managing your Housekeeping? N    Patient Care Team: Bennie Pierini, FNP as PCP - General (Nurse Practitioner) [place], Peter M, MD as PCP - Cardiology (Cardiology) Beryle Beams, MD as Consulting Physician (Neurology) [place], Peter M, MD as Consulting Physician (Cardiology) Glyn Ade, PA-C as Physician Assistant (Dermatology)  Indicate any recent Medical Services you may have received from other than [facility] providers in the past year (date may be approximate).     Assessment:   This is a routine wellness examination for [place].  Hearing/Vision screen Vision Screening - Comments:: Wears rx glasses - up to date with routine eye exams with  Dr.Groat    Goals Addressed             This Visit's Progress    DIET - EAT MORE FRUITS AND VEGETABLES         Depression Screen    07/12/2023    1:08 PM 04/16/2023    3:02 PM 01/16/2023   11:20 AM 10/23/2022   10:07 AM 07/28/2022   11:16 AM 07/10/2022    2:21 PM 04/21/2022   11:44 AM  PHQ 2/9 Scores  PHQ - 2 Score 0 0 0 0 0 0 0  PHQ- 9 Score  1 6 1 1  2     Fall Risk    07/12/2023    1:03 PM 04/16/2023    3:02 PM 01/16/2023  11:19 AM 10/23/2022   10:07 AM 07/28/2022   11:16 AM  Fall Risk   Falls in the past year? 1 1 1  0 0  Number falls in past yr: 1 1 1     Injury with Fall? 1 0 1    Risk for fall due to : History of fall(s);Impaired  balance/gait;Orthopedic patient History of fall(s) History of fall(s)    Follow up Education provided;Falls prevention discussed;Falls evaluation completed Education provided Education provided      MEDICARE RISK AT HOME: Medicare Risk at Home Any stairs in or around the home?: No If so, are there any without handrails?: No Home free of loose throw rugs in walkways, pet beds, electrical cords, etc?: Yes Adequate lighting in your home to reduce risk of falls?: Yes Life alert?: No Use of a cane, walker or w/c?: No Grab bars in the bathroom?: Yes Shower chair or bench in shower?: Yes Elevated toilet seat or a handicapped toilet?: Yes  TIMED UP AND GO:  Was the test performed?  No    Cognitive Function:    05/24/2015   12:09 PM  MMSE - Mini Mental State Exam  Orientation to time 5  Orientation to Place 5  Registration 3  Attention/ Calculation 5  Recall 3  Language- name 2 objects 2  Language- repeat 1  Language- follow 3 step command 3  Language- read & follow direction 1  Write a sentence 1  Copy design 1  Total score 30        07/12/2023    1:10 PM 07/10/2022    2:22 PM 06/28/2021    2:18 PM 06/03/2020   10:16 AM 05/01/2019    1:57 PM  6CIT Screen  What Year? 0 points 0 points 0 points 0 points 0 points  What month? 0 points 0 points 0 points 0 points 0 points  What time? 0 points 0 points 0 points 0 points 0 points  Count back from 20 0 points 0 points 0 points 0 points 0 points  Months in reverse 0 points 0 points 2 points 2 points 0 points  Repeat phrase 0 points 0 points 2 points 2 points 0 points  Total Score 0 points 0 points 4 points 4 points 0 points    Immunizations Immunization History  Administered Date(s) Administered   Fluad Quad(high Dose 65+) 05/29/2019, 08/16/2020, 06/14/2021, 07/28/2022   Fluad Trivalent(High Dose 65+) 05/15/2023   Influenza, High Dose Seasonal PF 10/03/2016, 09/03/2017, 06/10/2018   Influenza,inj,Quad PF,6+ Mos 05/24/2015    Influenza-Unspecified 07/18/2018   Moderna Sars-Covid-2 Vaccination 09/09/2019, 10/10/2019, 07/13/2020   Pneumococcal Conjugate-13 05/24/2015   Pneumococcal Polysaccharide-23 05/29/2019   Td 04/21/2022   Tdap 12/19/2010   Zoster Recombinant(Shingrix) 06/30/2019, 11/18/2019    TDAP status: Up to date  Flu Vaccine status: Up to date  Pneumococcal vaccine status: Up to date  Covid-19 vaccine status: Completed vaccines  Qualifies for Shingles Vaccine? Yes   Zostavax completed Yes   Shingrix Completed?: Yes  Screening Tests Health Maintenance  Topic Date Due   COVID-19 Vaccine (4 - 2023-24 season) 04/29/2023   Medicare Annual Wellness (AWV)  07/11/2024   DTaP/Tdap/Td (3 - Td or Tdap) 04/21/2032   Pneumonia Vaccine 43+ Years old  Completed   INFLUENZA VACCINE  Completed   DEXA SCAN  Completed   Zoster Vaccines- Shingrix  Completed   HPV VACCINES  Aged Out    Health Maintenance  Health Maintenance Due  Topic Date Due   COVID-19 Vaccine (  4 - 2023-24 season) 04/29/2023    Colorectal cancer screening: No longer required.   Mammogram status: No longer required due to age.  Bone Density status: Ordered patient declines . Pt provided with contact info and advised to call to schedule appt.  Lung Cancer Screening: (Low Dose CT Chest recommended if Age 50-80 years, 20 pack-year currently smoking OR have quit w/in 15years.) does not qualify.   Lung Cancer Screening Referral: n/a  Additional Screening:  Hepatitis C Screening: does not qualify;  Vision Screening: Recommended annual ophthalmology exams for early detection of glaucoma and other disorders of the eye. Is the patient up to date with their annual eye exam?  Yes  Who is the provider or what is the name of the office in which the patient attends annual eye exams? Dr.Groat  If pt is not established with a provider, would they like to be referred to a provider to establish care? No .   Dental Screening: Recommended  annual dental exams for proper oral hygiene   Community Resource Referral / Chronic Care Management: CRR required this visit?  No   CCM required this visit?  No     Plan:     I have personally reviewed and noted the following in the patient's chart:   Medical and social history Use of alcohol, tobacco or illicit drugs  Current medications and supplements including opioid prescriptions. Patient is not currently taking opioid prescriptions. Functional ability and status Nutritional status Physical activity Advanced directives List of other physicians Hospitalizations, surgeries, and ER visits in previous 12 months Vitals Screenings to include cognitive, depression, and falls Referrals and appointments  In addition, I have reviewed and discussed with patient certain preventive protocols, quality metrics, and best practice recommendations. A written personalized care plan for preventive services as well as general preventive health recommendations were provided to patient.     Lorrene Reid, LPN   [id number]/[id number]   After Visit Summary: (MyChart) Due to this being a telephonic visit, the after visit summary with patients personalized plan was offered to patient via MyChart   Nurse Notes: none

## 2023-07-12 NOTE — Patient Instructions (Signed)
Christina Silva , Thank you for taking time to come for your Medicare Wellness Visit. I appreciate your ongoing commitment to your health goals. Please review the following plan we discussed and let me know if I can assist you in the future.   Referrals/Orders/Follow-Ups/Clinician Recommendations: Aim for 30 minutes of exercise or brisk walking, 6-8 glasses of water, and 5 servings of fruits and vegetables each day.   This is a list of the screening recommended for you and due dates:  Health Maintenance  Topic Date Due   COVID-19 Vaccine (4 - 2023-24 season) 04/29/2023   Medicare Annual Wellness Visit  07/11/2024   DTaP/Tdap/Td vaccine (3 - Td or Tdap) 04/21/2032   Pneumonia Vaccine  Completed   Flu Shot  Completed   DEXA scan (bone density measurement)  Completed   Zoster (Shingles) Vaccine  Completed   HPV Vaccine  Aged Out    Advanced directives: (Copy Requested) Please bring a copy of your health care power of attorney and living will to the office to be added to your chart at your convenience.  Next Medicare Annual Wellness Visit scheduled for next year: Yes  Insert Preventive Care attachment Insert FALL PREVENTION attachment if needed

## 2023-09-05 ENCOUNTER — Other Ambulatory Visit: Payer: Self-pay | Admitting: Emergency Medicine

## 2023-09-05 DIAGNOSIS — H532 Diplopia: Secondary | ICD-10-CM | POA: Diagnosis not present

## 2023-09-08 NOTE — Progress Notes (Signed)
 Cardiology Office Note:    Date:  09/12/2023   ID:  Christina Silva, DOB 1933-03-07, MRN [id number]  PCP:  Delfina Feller, FNP   [facility] Providers Cardiologist:  Kylie Simmonds [place], MD     Referring MD: Delfina Feller, *   Chief Complaint  Patient presents with   Palpitations    History of Present Illness:    Christina Silva is a 89 y.o. female with a hx of HTN, HLD, h/o CVA, GERD, anxiety and vertigo.  Patient was seen by Dr. [place] in May 2019 for blood pressure management and lower extremity edema.  Echocardiogram at the time showed EF 55 to 60%, grade 1 DD, mild AI, mild to moderate MR/TR.  He February 2020, she suffered cryptogenic stroke and was placed on aspirin  and Plavix  for 1 month then aspirin  alone after that.  Repeat echocardiogram in February 2020 showed EF 60 to 65%, grade 1 DD, basal septal hypertrophy, mild AI, moderate aortic annular calcifications, mild mitral annular calcification.  She had a TIA in July 2020 and was restarted on Plavix .  Plavix  was later discontinued again by Dr. Janett Medin of neurology.  She was seen in August 2020 with atypical chest pain and negative troponin.  She was prescribed pantoprazole .  Follow-up Myoview  was normal.  She was started on amlodipine  for hypertension.  MRI of the brain obtained in January 2022 showed a new right posterior cerebral artery occlusion close to its origin and a stable appearance of moderate P1/P2 stenosis.  An implantable loop recorder was placed.  No arrhythmia noted to date.   She was seen this past year with complaints of palpitations. Event monitor showed some brief runs of SVT. Planned to switch amlodipine  to diltiazem .   On follow up today she is doing very well. Stopped using caffeine. Tolerating diltiazem  well. Brings BP readings which have been very good. No palpitations.    Past Medical History:  Diagnosis Date   Anxiety    Cataract    GERD (gastroesophageal reflux disease)     Hyperlipidemia    Hypertension    SCCA (squamous cell carcinoma) of skin 11/02/2020   Mid Chest (in situ) (tx p bx)   SCCA (squamous cell carcinoma) of skin 11/02/2020   Right Forearm Posterior (in situ) (tx p bx)   SCCA (squamous cell carcinoma) of skin 11/02/2020   Left Submandibular Area (in situ) (tx p bx)   Stroke (HCC) 09/2018   Vertigo    patient denies    Past Surgical History:  Procedure Laterality Date   ABDOMINAL HYSTERECTOMY  1980   CATARACT EXTRACTION Right    CHOLECYSTECTOMY N/A 12/16/2019   Procedure: LAPAROSCOPIC CHOLECYSTECTOMY WITH INTRAOPERATIVE CHOLANGIOGRAM;  Surgeon: Sim Dryer, MD;  Location: [facility] OR;  Service: General;  Laterality: N/A;   EYE SURGERY     implantable loop recorder placement  11/12/2020    Medtronic Reveal Linq model [id number] implantable loop recorder (SN [id number]) implanted by Dr Nunzio Belch for cryptogenic stroke   KNEE ARTHROSCOPY Left    ROTATOR CUFF REPAIR Left    SQUAMOUS CELL CARCINOMA EXCISION Left    TONSILLECTOMY      Current Medications: Current Meds  Medication Sig   aspirin  EC 81 MG tablet Take 81 mg by mouth daily. Swallow whole.   busPIRone  (BUSPAR ) 5 MG tablet Take 1 tablet (5 mg total) by mouth 2 (two) times daily as needed.   cholecalciferol (VITAMIN D) 25 MCG (1000 UNIT) tablet Take 2,000 Units by mouth daily.  diltiazem  (CARDIZEM  CD) 120 MG 24 hr capsule TAKE ONE CAPSULE BY MOUTH DAILY   escitalopram  (LEXAPRO ) 20 MG tablet Take 1 tablet (20 mg total) by mouth daily.   ibuprofen  (ADVIL ) 200 MG tablet Take 200 mg by mouth every 6 (six) hours as needed for mild pain.   losartan  (COZAAR ) 100 MG tablet Take 1 tablet (100 mg total) by mouth daily.   meclizine  (ANTIVERT ) 25 MG tablet TAKE 1 TABLET BY MOUTH 3 TIMES DAILY AS NEEDED FOR DIZZINESS   Multiple Vitamin (MULTIVITAMIN) tablet Take 1 tablet by mouth daily.   pantoprazole  (PROTONIX ) 40 MG tablet TAKE ONE (1) TABLET BY MOUTH EVERY DAY   Pyridoxine HCl (VITAMIN B6 PO) Take  by mouth daily.   rosuvastatin  (CRESTOR ) 20 MG tablet Take 1 tablet (20 mg total) by mouth daily.     Allergies:   Codeine, Lisinopril , Morphine, Other, Sulfa antibiotics, and Sulfonamide derivatives   Social History   Socioeconomic History   Marital status: Widowed    Spouse name: Not on file   Number of children: 2   Years of education: 10   Highest education level: Not on file  Occupational History   Occupation: retired  Tobacco Use   Smoking status: Former    Current packs/day: 0.00    Types: Cigarettes    Quit date: 01/09/1983    Years since quitting: 40.7   Smokeless tobacco: Never  Vaping Use   Vaping status: Never Used  Substance and Sexual Activity   Alcohol use: No   Drug use: No   Sexual activity: Yes  Other Topics Concern   Not on file  Social History Narrative   Lives alone handicap accessible home - daughter in [place] and son in [place].   Right Handed   Drinks caffeine occassionally   Social Drivers of Health   Financial Resource Strain: Low Risk  (07/12/2023)   Overall Financial Resource Strain (CARDIA)    Difficulty of Paying Living Expenses: Not hard at all  Food Insecurity: No Food Insecurity (07/12/2023)   Hunger Vital Sign    Worried About Running Out of Food in the Last Year: Never true    Ran Out of Food in the Last Year: Never true  Transportation Needs: No Transportation Needs (07/12/2023)   PRAPARE - Administrator, Civil Service (Medical): No    Lack of Transportation (Non-Medical): No  Physical Activity: Insufficiently Active (07/12/2023)   Exercise Vital Sign    Days of Exercise per Week: 3 days    Minutes of Exercise per Session: 30 min  Stress: No Stress Concern Present (07/12/2023)   [employer] of Occupational Health - Occupational Stress Questionnaire    Feeling of Stress : Not at all  Social Connections: Moderately Integrated (07/12/2023)   Social Connection and Isolation Panel [NHANES]    Frequency  of Communication with Friends and Family: More than three times a week    Frequency of Social Gatherings with Friends and Family: More than three times a week    Attends Religious Services: 1 to 4 times per year    Active Member of [employer] or Organizations: Yes    Attends Banker Meetings: More than 4 times per year    Marital Status: Widowed     Family History: The patient's family history includes Arthritis in her brother; Epilepsy in her brother; Heart attack in her mother; Heart disease in her mother and sister; Hyperlipidemia in her mother; Psychiatric Illness in her sister; Stroke  in her father; Transient ischemic attack in her father. There is no history of Colon cancer, Esophageal cancer, Rectal cancer, Stomach cancer, or Pancreatic cancer.  ROS:   Please see the history of present illness.     All other systems reviewed and are negative.  EKGs/Labs/Other Studies Reviewed:    The following studies were reviewed today:  Echo 10/18/2018 1. The left ventricle has normal systolic function with an ejection  fraction of 60-65%. The cavity size was normal. There is basal septal  hypertophy. Left ventricular diastolic Doppler parameters are consistent  with impaired relaxation.   2. The right ventricle has normal systolic function. The cavity was  normal. There is no increase in right ventricular wall thickness.   3. The aortic valve is tricuspid Moderate thickening of the aortic valve  Moderate calcification of the aortic valve. Aortic valve regurgitation is  mild by color flow Doppler.. Moderate aortic annular calcification noted.   4. The mitral valve is normal in structure. Mild thickening of the mitral  valve leaflet. Mild calcification of the mitral valve leaflet. There is  mild mitral annular calcification present. No evidence of mitral valve  stenosis.   5. The tricuspid valve is normal in structure.   6. The pulmonic valve was grossly normal. Pulmonic valve  regurgitation is  mild by color flow Doppler.   7. The aortic root is normal in size and structure.   8. Pulmonary hypertension is indeterminat,inadequate TR jet.   Cardiac event monitor 11/06/2022   Normal sinus rhythm   Several brief runs of nonsustained SVT - longest lasting 15 seconds.   Otherwise rare ectopy     Patch Wear Time:  3 days and 0 hours (2024-02-24T10:21:30-0500 to 2024-02-27T10:34:09-498)   Patient had a min HR of 46 bpm, max HR of 207 bpm, and avg HR of 71 bpm. Predominant underlying rhythm was Sinus Rhythm. 30 Supraventricular Tachycardia runs occurred, the run with the fastest interval lasting 8 beats with a max rate of 207 bpm, the  longest lasting 15.3 secs with an avg rate of 122 bpm. Isolated SVEs were rare (<1.0%), SVE Couplets were rare (<1.0%), and SVE Triplets were rare (<1.0%). Isolated VEs were rare (<1.0%, 431), VE Couplets were rare (<1.0%, 1), and VE Triplets were rare  (<1.0%, 2).   EKG:  EKG is not ordered today.    Recent Labs: 10/18/2022: Magnesium 2.0 10/23/2022: TSH 1.290 04/16/2023: ALT 14; BUN 14; Creatinine, Ser 0.73; Hemoglobin 13.7; Platelets 265; Potassium 4.2; Sodium 142  Recent Lipid Panel    Component Value Date/Time   CHOL 163 04/16/2023 1601   TRIG 143 04/16/2023 1601   TRIG 149 02/26/2014 0930   HDL 57 04/16/2023 1601   HDL 68 02/26/2014 0930   CHOLHDL 2.9 04/16/2023 1601   CHOLHDL 3.6 10/18/2018 0625   VLDL 27 10/18/2018 0625   LDLCALC 81 04/16/2023 1601   LDLCALC 144 (H) 02/26/2014 0930     Risk Assessment/Calculations:           Physical Exam:    VS:  BP 134/62 (BP Location: Left Arm, Patient Position: Sitting, Cuff Size: Normal)   Pulse 76   Ht 5\' 4"  (1.626 m)   Wt 142 lb 6.4 oz (64.6 kg)   SpO2 92%   BMI 24.44 kg/m     Wt Readings from Last 3 Encounters:  09/12/23 142 lb 6.4 oz (64.6 kg)  07/12/23 144 lb (65.3 kg)  07/10/23 144 lb (65.3 kg)     GEN:  Well nourished, well developed in no acute  distress HEENT: Normal NECK: No JVD; No carotid bruits LYMPHATICS: No lymphadenopathy CARDIAC: RRR, no murmurs, rubs, gallops RESPIRATORY:  Clear to auscultation without rales, wheezing or rhonchi  ABDOMEN: Soft, non-tender, non-distended MUSCULOSKELETAL:  No edema; No deformity  SKIN: Warm and dry NEUROLOGIC:  Alert and oriented x 3 PSYCHIATRIC:  Normal affect   ASSESSMENT:    1. Palpitations   2. SVT (supraventricular tachycardia) (HCC)   3. Cryptogenic stroke (HCC)   4. Primary hypertension      PLAN:    In order of problems listed above:   Hyperlipidemia: LDL remains elevated, increase Crestor  to 20 mg daily.  LDL improved to 81 on statin. continue  Hypertension: Blood pressure well controlled.   History of crytogenic stroke: No recurrence. No arrhythmia on ILR  4.   Palpitations. Brief SVT on monitor. Clinically much better on diltiazem       Follow up in one year.  Medication Adjustments/Labs and Tests Ordered: Current medicines are reviewed at length with the patient today.  Concerns regarding medicines are outlined above.  No orders of the defined types were placed in this encounter.  No orders of the defined types were placed in this encounter.   There are no Patient Instructions on file for this visit.   Signed, Jaquari Reckner [place], MD  09/12/2023 2:31 PM    [facility]

## 2023-09-12 ENCOUNTER — Encounter: Payer: Self-pay | Admitting: Cardiology

## 2023-09-12 ENCOUNTER — Ambulatory Visit: Payer: Medicare Other | Attending: Cardiology | Admitting: Cardiology

## 2023-09-12 VITALS — BP 134/62 | HR 76 | Ht 64.0 in | Wt 142.4 lb

## 2023-09-12 DIAGNOSIS — R002 Palpitations: Secondary | ICD-10-CM | POA: Diagnosis not present

## 2023-09-12 DIAGNOSIS — I639 Cerebral infarction, unspecified: Secondary | ICD-10-CM | POA: Diagnosis not present

## 2023-09-12 DIAGNOSIS — I471 Supraventricular tachycardia, unspecified: Secondary | ICD-10-CM

## 2023-09-12 DIAGNOSIS — I10 Essential (primary) hypertension: Secondary | ICD-10-CM

## 2023-09-12 NOTE — Patient Instructions (Signed)
 Medication Instructions:  Continue same medications *If you need a refill on your cardiac medications before your next appointment, please call your pharmacy*   Lab Work: None ordered   Testing/Procedures: None ordered   Follow-Up: At [facility], you and your health needs are our priority.  As part of our continuing mission to provide you with exceptional heart care, we have created designated Provider Care Teams.  These Care Teams include your primary Cardiologist (physician) and Advanced Practice Providers (APPs -  Physician Assistants and Nurse Practitioners) who all work together to provide you with the care you need, when you need it.  We recommend signing up for the patient portal called "MyChart".  Sign up information is provided on this After Visit Summary.  MyChart is used to connect with patients for Virtual Visits (Telemedicine).  Patients are able to view lab/test results, encounter notes, upcoming appointments, etc.  Non-urgent messages can be sent to your provider as well.   To learn more about what you can do with MyChart, go to [email or web address].    Your next appointment:  1 year   Call in Oct to schedule Jan appointment     Provider:  Dr.Jordan

## 2023-10-05 ENCOUNTER — Other Ambulatory Visit: Payer: Self-pay | Admitting: Nurse Practitioner

## 2023-10-05 DIAGNOSIS — E785 Hyperlipidemia, unspecified: Secondary | ICD-10-CM

## 2023-10-12 ENCOUNTER — Other Ambulatory Visit: Payer: Self-pay | Admitting: Nurse Practitioner

## 2023-10-12 DIAGNOSIS — E785 Hyperlipidemia, unspecified: Secondary | ICD-10-CM

## 2023-10-23 ENCOUNTER — Other Ambulatory Visit: Payer: Self-pay | Admitting: Nurse Practitioner

## 2023-10-23 ENCOUNTER — Encounter: Payer: Self-pay | Admitting: Nurse Practitioner

## 2023-10-23 ENCOUNTER — Ambulatory Visit (INDEPENDENT_AMBULATORY_CARE_PROVIDER_SITE_OTHER): Payer: Medicare Other | Admitting: Nurse Practitioner

## 2023-10-23 VITALS — BP 115/65 | HR 74 | Temp 98.0°F | Ht 64.0 in | Wt 139.0 lb

## 2023-10-23 DIAGNOSIS — Z8673 Personal history of transient ischemic attack (TIA), and cerebral infarction without residual deficits: Secondary | ICD-10-CM

## 2023-10-23 DIAGNOSIS — E785 Hyperlipidemia, unspecified: Secondary | ICD-10-CM | POA: Diagnosis not present

## 2023-10-23 DIAGNOSIS — F321 Major depressive disorder, single episode, moderate: Secondary | ICD-10-CM | POA: Diagnosis not present

## 2023-10-23 DIAGNOSIS — F411 Generalized anxiety disorder: Secondary | ICD-10-CM

## 2023-10-23 DIAGNOSIS — I10 Essential (primary) hypertension: Secondary | ICD-10-CM | POA: Diagnosis not present

## 2023-10-23 DIAGNOSIS — R5383 Other fatigue: Secondary | ICD-10-CM

## 2023-10-23 DIAGNOSIS — K219 Gastro-esophageal reflux disease without esophagitis: Secondary | ICD-10-CM | POA: Diagnosis not present

## 2023-10-23 DIAGNOSIS — M8588 Other specified disorders of bone density and structure, other site: Secondary | ICD-10-CM | POA: Diagnosis not present

## 2023-10-23 MED ORDER — ESCITALOPRAM OXALATE 20 MG PO TABS: 20.0000 mg | ORAL_TABLET | Freq: Every day | ORAL | 1 refills | Status: DC

## 2023-10-23 MED ORDER — DILTIAZEM HCL ER COATED BEADS 120 MG PO CP24: 120.0000 mg | ORAL_CAPSULE | Freq: Every day | ORAL | 1 refills | Status: DC

## 2023-10-23 MED ORDER — PANTOPRAZOLE SODIUM 40 MG PO TBEC: 40.0000 mg | DELAYED_RELEASE_TABLET | Freq: Every day | ORAL | 1 refills | Status: DC

## 2023-10-23 MED ORDER — LOSARTAN POTASSIUM 100 MG PO TABS: 100.0000 mg | ORAL_TABLET | Freq: Every day | ORAL | 1 refills | Status: DC

## 2023-10-23 MED ORDER — ROSUVASTATIN CALCIUM 20 MG PO TABS: 20.0000 mg | ORAL_TABLET | Freq: Every day | ORAL | 1 refills | Status: DC

## 2023-10-23 NOTE — Progress Notes (Signed)
 Subjective:    Patient ID: Christina Silva, female    DOB: 05/10/1933, 89 y.o.   MRN: [id number]   Chief Complaint: medical management of chronic issues     HPI:  Christina Silva is a 89 y.o. who identifies as a female who was assigned female at birth.   Social history: Lives with: by herself Work history: retired   Water engineer in today for follow up of the following chronic medical issues:  1. Primary hypertension No c/o chest pain, sob or headache. Does not check blood pressure at home. BP Readings from Last 3 Encounters:  09/12/23 134/62  06/12/23 136/72  05/15/23 134/66      2. Hyperlipidemia with target LDL less than 100 Does try to watch diet and stays active. Does no dedicated exercise. Lab Results  Component Value Date   CHOL 163 04/16/2023   HDL 57 04/16/2023   LDLCALC 81 04/16/2023   TRIG 143 04/16/2023   CHOLHDL 2.9 04/16/2023     3. History of cerebrovascular accident (CVA) No permanent effects. Is on daily baby aspirin.  4. Gastroesophageal reflux disease without esophagitis Is on protonix daily and is doing well.  5. Anxiety state Is on buspar BID but her cardiologist took her off of that and started her on lexapro 10mg  daily. She is doing well.    10/23/2023    1:59 PM 04/16/2023    3:03 PM 01/16/2023   11:20 AM 10/23/2022   10:07 AM  GAD 7 : Generalized Anxiety Score  Nervous, Anxious, on Edge 0 0 0 1  Control/stop worrying 1 0 0 1  Worry too much - different things 1 0 0 1  Trouble relaxing 0 0 0 0  Restless 0 0 0 0  Easily annoyed or irritable 0 0 0 0  Afraid - awful might happen 0 0 0 0  Total GAD 7 Score 2 0 0 3  Anxiety Difficulty Not difficult at all Not difficult at all Not difficult at all Somewhat difficult      6. Depression, major, single episode, moderate (HCC) Is on lexapro and is doing well. Worries about her health    10/23/2023    1:59 PM 07/12/2023    1:08 PM 04/16/2023    3:02 PM  Depression screen PHQ 2/9   Decreased Interest 0 0 0  Down, Depressed, Hopeless 0 0 0  PHQ - 2 Score 0 0 0  Altered sleeping 0  1  Tired, decreased energy 2  0  Change in appetite 0  0  Feeling bad or failure about yourself  0  0  Trouble concentrating 0  0  Moving slowly or fidgety/restless 0  0  Suicidal thoughts 0  0  PHQ-9 Score 2  1  Difficult doing work/chores Not difficult at all  Not difficult at all      7. Osteopenia of spine Last dexascan was done years ago- refuses treatment   New complaints: Feels tired all the time- wants b12 checked  Allergies  Allergen Reactions   Codeine Nausea And Vomiting   Lisinopril Swelling   Morphine Nausea And Vomiting   Other    Sulfa Antibiotics Nausea Only and Rash   Sulfonamide Derivatives Nausea Only and Rash   Outpatient Encounter Medications as of 10/23/2023  Medication Sig   acetaminophen (TYLENOL) 325 MG tablet Take 650 mg by mouth every 6 (six) hours as needed for mild pain. (Patient not taking: Reported on 09/12/2023)   aspirin EC 81  MG tablet Take 81 mg by mouth daily. Swallow whole.   busPIRone (BUSPAR) 5 MG tablet Take 1 tablet (5 mg total) by mouth 2 (two) times daily as needed.   cholecalciferol (VITAMIN D) 25 MCG (1000 UNIT) tablet Take 2,000 Units by mouth daily.   diltiazem (CARDIZEM CD) 120 MG 24 hr capsule TAKE ONE CAPSULE BY MOUTH DAILY   doxycycline (VIBRA-TABS) 100 MG tablet Take 1 tablet (100 mg total) by mouth 2 (two) times daily. 1 po bid (Patient not taking: Reported on 09/12/2023)   escitalopram (LEXAPRO) 20 MG tablet Take 1 tablet (20 mg total) by mouth daily.   HYDROcodone-acetaminophen (NORCO/VICODIN) 5-325 MG tablet Take 1 tablet by mouth every 6 (six) hours as needed. (Patient not taking: Reported on 09/12/2023)   ibuprofen (ADVIL) 200 MG tablet Take 200 mg by mouth every 6 (six) hours as needed for mild pain.   losartan (COZAAR) 100 MG tablet Take 1 tablet (100 mg total) by mouth daily.   meclizine (ANTIVERT) 25 MG tablet TAKE  1 TABLET BY MOUTH 3 TIMES DAILY AS NEEDED FOR DIZZINESS   Multiple Vitamin (MULTIVITAMIN) tablet Take 1 tablet by mouth daily.   pantoprazole (PROTONIX) 40 MG tablet TAKE ONE (1) TABLET BY MOUTH EVERY DAY   Pyridoxine HCl (VITAMIN B6 PO) Take by mouth daily.   rosuvastatin (CRESTOR) 20 MG tablet TAKE ONE (1) TABLET BY MOUTH EVERY DAY   [DISCONTINUED] pantoprazole (PROTONIX) 40 MG tablet TAKE ONE (1) TABLET BY MOUTH EVERY DAY   No facility-administered encounter medications on file as of 10/23/2023.    Past Surgical History:  Procedure Laterality Date   ABDOMINAL HYSTERECTOMY  1980   CATARACT EXTRACTION Right    CHOLECYSTECTOMY N/A 12/16/2019   Procedure: LAPAROSCOPIC CHOLECYSTECTOMY WITH INTRAOPERATIVE CHOLANGIOGRAM;  Surgeon: Harriette Bouillon, MD;  Location: [facility] OR;  Service: General;  Laterality: N/A;   EYE SURGERY     implantable loop recorder placement  11/12/2020    Medtronic Reveal Linq model [id number] implantable loop recorder (SN [id number]) implanted by Dr Johney Frame for cryptogenic stroke   KNEE ARTHROSCOPY Left    ROTATOR CUFF REPAIR Left    SQUAMOUS CELL CARCINOMA EXCISION Left    TONSILLECTOMY      Family History  Problem Relation Age of Onset   Heart attack Mother    Hyperlipidemia Mother    Heart disease Mother    Transient ischemic attack Father    Stroke Father    Psychiatric Illness Sister    Heart disease Sister    Arthritis Brother    Epilepsy Brother    Colon cancer Neg Hx    Esophageal cancer Neg Hx    Rectal cancer Neg Hx    Stomach cancer Neg Hx    Pancreatic cancer Neg Hx       Controlled substance contract: n/a     Review of Systems  Constitutional:  Negative for diaphoresis.  Eyes:  Negative for pain.  Respiratory:  Negative for shortness of breath.   Cardiovascular:  Positive for chest pain. Negative for palpitations and leg swelling.  Gastrointestinal:  Negative for abdominal pain.  Endocrine: Negative for polydipsia.  Skin:  Negative for  rash.  Neurological:  Negative for dizziness, weakness and headaches.  Hematological:  Does not bruise/bleed easily.  All other systems reviewed and are negative.      Objective:   Physical Exam Vitals and nursing note reviewed.  Constitutional:      General: She is not in acute distress.  Appearance: Normal appearance. She is well-developed.  HENT:     Head: Normocephalic.     Right Ear: Tympanic membrane normal.     Left Ear: Tympanic membrane normal.     Nose: Nose normal.     Mouth/Throat:     Mouth: Mucous membranes are moist.  Eyes:     Pupils: Pupils are equal, round, and reactive to light.  Neck:     Vascular: No carotid bruit or JVD.  Cardiovascular:     Rate and Rhythm: Normal rate and regular rhythm.     Heart sounds: Normal heart sounds.  Pulmonary:     Effort: Pulmonary effort is normal. No respiratory distress.     Breath sounds: Normal breath sounds. No wheezing or rales.  Chest:     Chest wall: No tenderness.  Abdominal:     General: Bowel sounds are normal. There is no distension or abdominal bruit.     Palpations: Abdomen is soft. There is no hepatomegaly, splenomegaly, mass or pulsatile mass.     Tenderness: There is no abdominal tenderness.  Musculoskeletal:        General: Normal range of motion.     Cervical back: Normal range of motion and neck supple.  Lymphadenopathy:     Cervical: No cervical adenopathy.  Skin:    General: Skin is warm and dry.  Neurological:     Mental Status: She is alert and oriented to person, place, and time.     Deep Tendon Reflexes: Reflexes are normal and symmetric.  Psychiatric:        Behavior: Behavior normal.        Thought Content: Thought content normal.        Judgment: Judgment normal.      There were no vitals taken for this visit.  EKG- NSR-Mary-Margaret Daphine Deutscher, FNP      Assessment & Plan:   CAITLAN CHAUCA comes in today with chief complaint of Medical Management of Chronic  Issues   Diagnosis and orders addressed:  1. Primary hypertension (Primary) Low sodium diet - CBC with Differential/Platelet - CMP14+EGFR - losartan (COZAAR) 100 MG tablet; Take 1 tablet (100 mg total) by mouth daily.  Dispense: 90 tablet; Refill: 1 - diltiazem (CARDIZEM CD) 120 MG 24 hr capsule; Take 1 capsule (120 mg total) by mouth daily.  Dispense: 90 capsule; Refill: 1  2. Hyperlipidemia with target LDL less than 100 Low fat diet - Lipid panel - rosuvastatin (CRESTOR) 20 MG tablet; Take 1 tablet (20 mg total) by mouth daily.  Dispense: 90 tablet; Refill: 1  3. Gastroesophageal reflux disease without esophagitis Avoid spicy foods Do not eat 2 hours prior to bedtime - pantoprazole (PROTONIX) 40 MG tablet; Take 1 tablet (40 mg total) by mouth daily.  Dispense: 90 tablet; Refill: 1  4. Anxiety state Stress management  5. Depression, major, single episode, moderate (HCC) Stress management - escitalopram (LEXAPRO) 20 MG tablet; Take 1 tablet (20 mg total) by mouth daily.  Dispense: 90 tablet; Refill: 1  6. History of CVA (cerebrovascular accident) Fall prevention  7. Osteopenia of spine Weight bearing exercises  8. Fatigue, unspecified type Labs pending - Thyroid Panel With TSH - Vitamin B12   Labs pending Health Maintenance reviewed Diet and exercise encouraged  Follow up plan: 6 months   Mary-Margaret Daphine Deutscher, FNP

## 2023-10-23 NOTE — Patient Instructions (Signed)
 Fall Prevention in the Home, Adult Falls can cause injuries and can happen to people of all ages. There are many things you can do to make your home safer and to help prevent falls. What actions can I take to prevent falls? General information Use good lighting in all rooms. Make sure to: Replace any light bulbs that burn out. Turn on the lights in dark areas and use night-lights. Keep items that you use often in easy-to-reach places. Lower the shelves around your home if needed. Move furniture so that there are clear paths around it. Do not use throw rugs or other things on the floor that can make you trip. If any of your floors are uneven, fix them. Add color or contrast paint or tape to clearly mark and help you see: Grab bars or handrails. First and last steps of staircases. Where the edge of each step is. If you use a ladder or stepladder: Make sure that it is fully opened. Do not climb a closed ladder. Make sure the sides of the ladder are locked in place. Have someone hold the ladder while you use it. Know where your pets are as you move through your home. What can I do in the bathroom?     Keep the floor dry. Clean up any water on the floor right away. Remove soap buildup in the bathtub or shower. Buildup makes bathtubs and showers slippery. Use non-skid mats or decals on the floor of the bathtub or shower. Attach bath mats securely with double-sided, non-slip rug tape. If you need to sit down in the shower, use a non-slip stool. Install grab bars by the toilet and in the bathtub and shower. Do not use towel bars as grab bars. What can I do in the bedroom? Make sure that you have a light by your bed that is easy to reach. Do not use any sheets or blankets on your bed that hang to the floor. Have a firm chair or bench with side arms that you can use for support when you get dressed. What can I do in the kitchen? Clean up any spills right away. If you need to reach something  above you, use a step stool with a grab bar. Keep electrical cords out of the way. Do not use floor polish or wax that makes floors slippery. What can I do with my stairs? Do not leave anything on the stairs. Make sure that you have a light switch at the top and the bottom of the stairs. Make sure that there are handrails on both sides of the stairs. Fix handrails that are broken or loose. Install non-slip stair treads on all your stairs if they do not have carpet. Avoid having throw rugs at the top or bottom of the stairs. Choose a carpet that does not hide the edge of the steps on the stairs. Make sure that the carpet is firmly attached to the stairs. Fix carpet that is loose or worn. What can I do on the outside of my home? Use bright outdoor lighting. Fix the edges of walkways and driveways and fix any cracks. Clear paths of anything that can make you trip, such as tools or rocks. Add color or contrast paint or tape to clearly mark and help you see anything that might make you trip as you walk through a door, such as a raised step or threshold. Trim any bushes or trees on paths to your home. Check to see if handrails are loose  or broken and that both sides of all steps have handrails. Install guardrails along the edges of any raised decks and porches. Have leaves, snow, or ice cleared regularly. Use sand, salt, or ice melter on paths if you live where there is ice and snow during the winter. Clean up any spills in your garage right away. This includes grease or oil spills. What other actions can I take? Review your medicines with your doctor. Some medicines can cause dizziness or changes in blood pressure, which increase your risk of falling. Wear shoes that: Have a low heel. Do not wear high heels. Have rubber bottoms and are closed at the toe. Feel good on your feet and fit well. Use tools that help you move around if needed. These include: Canes. Walkers. Scooters. Crutches. Ask  your doctor what else you can do to help prevent falls. This may include seeing a physical therapist to learn to do exercises to move better and get stronger. Where to find more information Centers for Disease Control and Prevention, [employer]: TonerPromos.no [employer] on Aging: BaseRingTones.pl National Institute on Aging: BaseRingTones.pl Contact a doctor if: You are afraid of falling at home. You feel weak, drowsy, or dizzy at home. You fall at home. Get help right away if you: Lose consciousness or have trouble moving after a fall. Have a fall that causes a head injury. These symptoms may be an emergency. Get help right away. Call 911. Do not wait to see if the symptoms will go away. Do not drive yourself to the hospital. This information is not intended to replace advice given to you by your health care provider. Make sure you discuss any questions you have with your health care provider. Document Revised: 04/17/2022 Document Reviewed: 04/17/2022 Elsevier Patient Education  2024 [employer].

## 2023-10-24 LAB — CMP14+EGFR
ALT: 18 IU/L (ref 0–32)
AST: 27 IU/L (ref 0–40)
Albumin: 4.4 g/dL (ref 3.7–4.7)
Alkaline Phosphatase: 85 IU/L (ref 44–121)
BUN/Creatinine Ratio: 16 (ref 12–28)
BUN: 13 mg/dL (ref 8–27)
Bilirubin Total: 0.6 mg/dL (ref 0.0–1.2)
CO2: 23 mmol/L (ref 20–29)
Calcium: 10.5 mg/dL — ABNORMAL HIGH (ref 8.7–10.3)
Chloride: 105 mmol/L (ref 96–106)
Creatinine, Ser: 0.83 mg/dL (ref 0.57–1.00)
Globulin, Total: 2.3 g/dL (ref 1.5–4.5)
Glucose: 99 mg/dL (ref 70–99)
Potassium: 4.3 mmol/L (ref 3.5–5.2)
Sodium: 141 mmol/L (ref 134–144)
Total Protein: 6.7 g/dL (ref 6.0–8.5)
eGFR: 68 mL/min/{1.73_m2} (ref >59)

## 2023-10-24 LAB — THYROID PANEL WITH TSH
Free Thyroxine Index: 2.5 (ref 1.2–4.9)
T3 Uptake Ratio: 24 % (ref 24–39)
T4, Total: 10.3 ug/dL (ref 4.5–12.0)
TSH: 2.2 u[IU]/mL (ref 0.450–4.500)

## 2023-10-24 LAB — CBC WITH DIFFERENTIAL/PLATELET
Basophils Absolute: 0 10*3/uL (ref 0.0–0.2)
Basos: 1 %
EOS (ABSOLUTE): 0.3 10*3/uL (ref 0.0–0.4)
Eos: 3 %
Hematocrit: 44 % (ref 34.0–46.6)
Hemoglobin: 15 g/dL (ref 11.1–15.9)
Immature Grans (Abs): 0 10*3/uL (ref 0.0–0.1)
Immature Granulocytes: 0 %
Lymphocytes Absolute: 1.8 10*3/uL (ref 0.7–3.1)
Lymphs: 25 %
MCH: 31.3 pg (ref 26.6–33.0)
MCHC: 34.1 g/dL (ref 31.5–35.7)
MCV: 92 fL (ref 79–97)
Monocytes Absolute: 0.6 10*3/uL (ref 0.1–0.9)
Monocytes: 8 %
Neutrophils Absolute: 4.8 10*3/uL (ref 1.4–7.0)
Neutrophils: 63 %
Platelets: 308 10*3/uL (ref 150–450)
RBC: 4.79 x10E6/uL (ref 3.77–5.28)
RDW: 11.7 % (ref 11.7–15.4)
WBC: 7.5 10*3/uL (ref 3.4–10.8)

## 2023-10-24 LAB — VITAMIN B12: Vitamin B-12: 629 pg/mL (ref 232–1245)

## 2023-10-24 LAB — LIPID PANEL
Chol/HDL Ratio: 2.6 ratio (ref 0.0–4.4)
Cholesterol, Total: 171 mg/dL (ref 100–199)
HDL: 67 mg/dL (ref >39)
LDL Chol Calc (NIH): 83 mg/dL (ref 0–99)
Triglycerides: 122 mg/dL (ref 0–149)
VLDL Cholesterol Cal: 21 mg/dL (ref 5–40)

## 2023-11-29 ENCOUNTER — Other Ambulatory Visit: Payer: Self-pay | Admitting: Nurse Practitioner

## 2024-01-24 ENCOUNTER — Other Ambulatory Visit: Payer: Self-pay | Admitting: Nurse Practitioner

## 2024-02-07 DIAGNOSIS — L600 Ingrowing nail: Secondary | ICD-10-CM | POA: Diagnosis not present

## 2024-02-07 DIAGNOSIS — M79674 Pain in right toe(s): Secondary | ICD-10-CM | POA: Diagnosis not present

## 2024-02-07 DIAGNOSIS — L03031 Cellulitis of right toe: Secondary | ICD-10-CM | POA: Diagnosis not present

## 2024-02-21 DIAGNOSIS — M79674 Pain in right toe(s): Secondary | ICD-10-CM | POA: Diagnosis not present

## 2024-02-21 DIAGNOSIS — L03031 Cellulitis of right toe: Secondary | ICD-10-CM | POA: Diagnosis not present

## 2024-03-27 ENCOUNTER — Other Ambulatory Visit: Payer: Self-pay | Admitting: Nurse Practitioner

## 2024-03-27 DIAGNOSIS — E785 Hyperlipidemia, unspecified: Secondary | ICD-10-CM

## 2024-04-21 ENCOUNTER — Encounter: Payer: Self-pay | Admitting: Nurse Practitioner

## 2024-04-21 ENCOUNTER — Ambulatory Visit (INDEPENDENT_AMBULATORY_CARE_PROVIDER_SITE_OTHER): Payer: Medicare Other | Admitting: Nurse Practitioner

## 2024-04-21 VITALS — BP 141/68 | HR 94 | Temp 97.9°F | Ht 64.0 in | Wt 137.0 lb

## 2024-04-21 DIAGNOSIS — K219 Gastro-esophageal reflux disease without esophagitis: Secondary | ICD-10-CM | POA: Diagnosis not present

## 2024-04-21 DIAGNOSIS — F321 Major depressive disorder, single episode, moderate: Secondary | ICD-10-CM

## 2024-04-21 DIAGNOSIS — M8588 Other specified disorders of bone density and structure, other site: Secondary | ICD-10-CM | POA: Diagnosis not present

## 2024-04-21 DIAGNOSIS — E785 Hyperlipidemia, unspecified: Secondary | ICD-10-CM | POA: Diagnosis not present

## 2024-04-21 DIAGNOSIS — I10 Essential (primary) hypertension: Secondary | ICD-10-CM

## 2024-04-21 DIAGNOSIS — F411 Generalized anxiety disorder: Secondary | ICD-10-CM

## 2024-04-21 DIAGNOSIS — Z8673 Personal history of transient ischemic attack (TIA), and cerebral infarction without residual deficits: Secondary | ICD-10-CM | POA: Diagnosis not present

## 2024-04-21 MED ORDER — LOSARTAN POTASSIUM 100 MG PO TABS
100.0000 mg | ORAL_TABLET | Freq: Every day | ORAL | 1 refills | Status: AC
Start: 2024-04-21 — End: ?

## 2024-04-21 MED ORDER — ESCITALOPRAM OXALATE 20 MG PO TABS: 20.0000 mg | ORAL_TABLET | Freq: Every day | ORAL | 1 refills | Status: AC

## 2024-04-21 MED ORDER — DILTIAZEM HCL ER COATED BEADS 120 MG PO CP24: 120.0000 mg | ORAL_CAPSULE | Freq: Every day | ORAL | 1 refills | Status: AC

## 2024-04-21 MED ORDER — PANTOPRAZOLE SODIUM 40 MG PO TBEC: 40.0000 mg | DELAYED_RELEASE_TABLET | Freq: Every day | ORAL | 1 refills | Status: AC

## 2024-04-21 MED ORDER — ROSUVASTATIN CALCIUM 20 MG PO TABS: 20.0000 mg | ORAL_TABLET | Freq: Every day | ORAL | 1 refills | Status: AC

## 2024-04-21 NOTE — Addendum Note (Signed)
 Addended by: Axell Trigueros, MARY-MARGARET on: 04/21/2024 02:18 PM   Modules accepted: Orders

## 2024-04-21 NOTE — Progress Notes (Signed)
 Subjective:    Patient ID: Christina Silva, female    DOB: 10/31/1932, 89 y.o.   MRN: [id number]   Chief Complaint: medical management of chronic issues     HPI:  Christina Silva is a 89 y.o. who identifies as a female who was assigned female at birth.   Social history: Lives with: by herself Work history: retired   Water engineer in today for follow up of the following chronic medical issues:  1. Primary hypertension No c/o chest pain, sob or headache. Does not check blood pressure at home. BP Readings from Last 3 Encounters:  10/23/23 115/65  09/12/23 134/62  06/12/23 136/72      2. Hyperlipidemia with target LDL less than 100 Does try to watch diet and stays active. Does no dedicated exercise. Lab Results  Component Value Date   CHOL 171 10/23/2023   HDL 67 10/23/2023   LDLCALC 83 10/23/2023   TRIG 122 10/23/2023   CHOLHDL 2.6 10/23/2023     3. History of cerebrovascular accident (CVA) No permanent effects. Is on daily baby aspirin .  4. Gastroesophageal reflux disease without esophagitis Is on protonix  daily and is doing well.  5. Anxiety state Is on buspar  BID but her cardiologist took her off of that and started her on lexapro  10mg  daily. She is doing well.    04/21/2024    2:02 PM 10/23/2023    1:59 PM 04/16/2023    3:03 PM 01/16/2023   11:20 AM  GAD 7 : Generalized Anxiety Score  Nervous, Anxious, on Edge 0 0 0 0  Control/stop worrying 0 1 0 0  Worry too much - different things 0 1 0 0  Trouble relaxing 0 0 0 0  Restless 0 0 0 0  Easily annoyed or irritable 0 0 0 0  Afraid - awful might happen 0 0 0 0  Total GAD 7 Score 0 2 0 0  Anxiety Difficulty Not difficult at all Not difficult at all Not difficult at all Not difficult at all        6. Depression, major, single episode, moderate (HCC) Is on lexapro  and is doing well. Worries about her health    04/21/2024    2:01 PM 10/23/2023    1:59 PM 07/12/2023    1:08 PM  Depression screen PHQ 2/9   Decreased Interest 0 0 0  Down, Depressed, Hopeless 0 0 0  PHQ - 2 Score 0 0 0  Altered sleeping 0 0   Tired, decreased energy 0 2   Change in appetite 0 0   Feeling bad or failure about yourself  0 0   Trouble concentrating 0 0   Moving slowly or fidgety/restless 0 0   Suicidal thoughts 0 0   PHQ-9 Score 0 2   Difficult doing work/chores Not difficult at all Not difficult at all        7. Osteopenia of spine Last dexascan was done years ago- refuses treatment   New complaints: None today  Allergies  Allergen Reactions   Codeine Nausea And Vomiting   Lisinopril  Swelling   Morphine Nausea And Vomiting   Other    Sulfa Antibiotics Nausea Only and Rash   Sulfonamide Derivatives Nausea Only and Rash   Outpatient Encounter Medications as of 04/21/2024  Medication Sig   acetaminophen  (TYLENOL ) 325 MG tablet Take 650 mg by mouth every 6 (six) hours as needed for mild pain (pain score 1-3).   aspirin  EC 81 MG tablet Take  81 mg by mouth daily. Swallow whole.   cholecalciferol (VITAMIN D) 25 MCG (1000 UNIT) tablet Take 2,000 Units by mouth daily.   diltiazem  (CARDIZEM  CD) 120 MG 24 hr capsule Take 1 capsule (120 mg total) by mouth daily.   escitalopram  (LEXAPRO ) 20 MG tablet Take 1 tablet (20 mg total) by mouth daily.   HYDROcodone -acetaminophen  (NORCO/VICODIN) 5-325 MG tablet Take 1 tablet by mouth every 6 (six) hours as needed.   ibuprofen  (ADVIL ) 200 MG tablet Take 200 mg by mouth every 6 (six) hours as needed for mild pain.   losartan  (COZAAR ) 100 MG tablet Take 1 tablet (100 mg total) by mouth daily.   Multiple Vitamin (MULTIVITAMIN) tablet Take 1 tablet by mouth daily.   pantoprazole  (PROTONIX ) 40 MG tablet Take 1 tablet (40 mg total) by mouth daily.   Pyridoxine HCl (VITAMIN B6 PO) Take by mouth daily.   rosuvastatin  (CRESTOR ) 20 MG tablet TAKE ONE (1) TABLET BY MOUTH EVERY DAY   No facility-administered encounter medications on file as of 04/21/2024.    Past Surgical  History:  Procedure Laterality Date   ABDOMINAL HYSTERECTOMY  1980   CATARACT EXTRACTION Right    CHOLECYSTECTOMY N/A 12/16/2019   Procedure: LAPAROSCOPIC CHOLECYSTECTOMY WITH INTRAOPERATIVE CHOLANGIOGRAM;  Surgeon: Vanderbilt Ned, MD;  Location: [facility] OR;  Service: General;  Laterality: N/A;   EYE SURGERY     implantable loop recorder placement  11/12/2020    Medtronic Reveal Linq model [id number] implantable loop recorder (SN [id number]) implanted by Dr Kelsie for cryptogenic stroke   KNEE ARTHROSCOPY Left    ROTATOR CUFF REPAIR Left    SQUAMOUS CELL CARCINOMA EXCISION Left    TONSILLECTOMY      Family History  Problem Relation Age of Onset   Heart attack Mother    Hyperlipidemia Mother    Heart disease Mother    Transient ischemic attack Father    Stroke Father    Psychiatric Illness Sister    Heart disease Sister    Arthritis Brother    Epilepsy Brother    Colon cancer Neg Hx    Esophageal cancer Neg Hx    Rectal cancer Neg Hx    Stomach cancer Neg Hx    Pancreatic cancer Neg Hx       Controlled substance contract: n/a     Review of Systems  Constitutional:  Negative for diaphoresis.  Eyes:  Negative for pain.  Respiratory:  Negative for shortness of breath.   Cardiovascular:  Positive for chest pain. Negative for palpitations and leg swelling.  Gastrointestinal:  Negative for abdominal pain.  Endocrine: Negative for polydipsia.  Skin:  Negative for rash.  Neurological:  Negative for dizziness, weakness and headaches.  Hematological:  Does not bruise/bleed easily.  All other systems reviewed and are negative.      Objective:   Physical Exam Vitals and nursing note reviewed.  Constitutional:      General: She is not in acute distress.    Appearance: Normal appearance. She is well-developed.  HENT:     Head: Normocephalic.     Right Ear: Tympanic membrane normal.     Left Ear: Tympanic membrane normal.     Nose: Nose normal.     Mouth/Throat:     Mouth:  Mucous membranes are moist.  Eyes:     Pupils: Pupils are equal, round, and reactive to light.  Neck:     Vascular: No carotid bruit or JVD.  Cardiovascular:     Rate and Rhythm:  Normal rate and regular rhythm.     Heart sounds: Normal heart sounds.  Pulmonary:     Effort: Pulmonary effort is normal. No respiratory distress.     Breath sounds: Normal breath sounds. No wheezing or rales.  Chest:     Chest wall: No tenderness.  Abdominal:     General: Bowel sounds are normal. There is no distension or abdominal bruit.     Palpations: Abdomen is soft. There is no hepatomegaly, splenomegaly, mass or pulsatile mass.     Tenderness: There is no abdominal tenderness.  Musculoskeletal:        General: Normal range of motion.     Cervical back: Normal range of motion and neck supple.  Lymphadenopathy:     Cervical: No cervical adenopathy.  Skin:    General: Skin is warm and dry.  Neurological:     Mental Status: She is alert and oriented to person, place, and time.     Deep Tendon Reflexes: Reflexes are normal and symmetric.  Psychiatric:        Behavior: Behavior normal.        Thought Content: Thought content normal.        Judgment: Judgment normal.     BP (!) 141/68   Pulse 94   Temp 97.9 F (36.6 C) (Temporal)   Ht 5' 4 (1.626 m)   Wt 137 lb (62.1 kg)   BMI 23.52 kg/m          Assessment & Plan:   Christina Silva comes in today with chief complaint of No chief complaint on file.   Diagnosis and orders addressed:  1. Primary hypertension (Primary) Low sodium diet - CBC with Differential/Platelet - CMP14+EGFR - losartan  (COZAAR ) 100 MG tablet; Take 1 tablet (100 mg total) by mouth daily.  Dispense: 90 tablet; Refill: 1 - diltiazem  (CARDIZEM  CD) 120 MG 24 hr capsule; Take 1 capsule (120 mg total) by mouth daily.  Dispense: 90 capsule; Refill: 1  2. Hyperlipidemia with target LDL less than 100 Low fat diet - Lipid panel - rosuvastatin  (CRESTOR ) 20 MG  tablet; Take 1 tablet (20 mg total) by mouth daily.  Dispense: 90 tablet; Refill: 1  3. Gastroesophageal reflux disease without esophagitis Avoid spicy foods Do not eat 2 hours prior to bedtime - pantoprazole  (PROTONIX ) 40 MG tablet; Take 1 tablet (40 mg total) by mouth daily.  Dispense: 90 tablet; Refill: 1  4. Anxiety state Stress management  5. Depression, major, single episode, moderate (HCC) Stress management - escitalopram  (LEXAPRO ) 20 MG tablet; Take 1 tablet (20 mg total) by mouth daily.  Dispense: 90 tablet; Refill: 1  6. History of CVA (cerebrovascular accident) Fall prevention  7. Osteopenia of spine Weight bearing exercises  8. Fatigue, unspecified type Labs pending - Thyroid  Panel With TSH - Vitamin B12   Labs pending Health Maintenance reviewed Diet and exercise encouraged  Follow up plan: 6 months   Mary-Margaret Gladis, FNP

## 2024-04-21 NOTE — Patient Instructions (Signed)
 Fall Prevention in the Home, Adult Falls can cause injuries and can happen to people of all ages. There are many things you can do to make your home safer and to help prevent falls. What actions can I take to prevent falls? General information Use good lighting in all rooms. Make sure to: Replace any light bulbs that burn out. Turn on the lights in dark areas and use night-lights. Keep items that you use often in easy-to-reach places. Lower the shelves around your home if needed. Move furniture so that there are clear paths around it. Do not use throw rugs or other things on the floor that can make you trip. If any of your floors are uneven, fix them. Add color or contrast paint or tape to clearly mark and help you see: Grab bars or handrails. First and last steps of staircases. Where the edge of each step is. If you use a ladder or stepladder: Make sure that it is fully opened. Do not climb a closed ladder. Make sure the sides of the ladder are locked in place. Have someone hold the ladder while you use it. Know where your pets are as you move through your home. What can I do in the bathroom?     Keep the floor dry. Clean up any water on the floor right away. Remove soap buildup in the bathtub or shower. Buildup makes bathtubs and showers slippery. Use non-skid mats or decals on the floor of the bathtub or shower. Attach bath mats securely with double-sided, non-slip rug tape. If you need to sit down in the shower, use a non-slip stool. Install grab bars by the toilet and in the bathtub and shower. Do not use towel bars as grab bars. What can I do in the bedroom? Make sure that you have a light by your bed that is easy to reach. Do not use any sheets or blankets on your bed that hang to the floor. Have a firm chair or bench with side arms that you can use for support when you get dressed. What can I do in the kitchen? Clean up any spills right away. If you need to reach something  above you, use a step stool with a grab bar. Keep electrical cords out of the way. Do not use floor polish or wax that makes floors slippery. What can I do with my stairs? Do not leave anything on the stairs. Make sure that you have a light switch at the top and the bottom of the stairs. Make sure that there are handrails on both sides of the stairs. Fix handrails that are broken or loose. Install non-slip stair treads on all your stairs if they do not have carpet. Avoid having throw rugs at the top or bottom of the stairs. Choose a carpet that does not hide the edge of the steps on the stairs. Make sure that the carpet is firmly attached to the stairs. Fix carpet that is loose or worn. What can I do on the outside of my home? Use bright outdoor lighting. Fix the edges of walkways and driveways and fix any cracks. Clear paths of anything that can make you trip, such as tools or rocks. Add color or contrast paint or tape to clearly mark and help you see anything that might make you trip as you walk through a door, such as a raised step or threshold. Trim any bushes or trees on paths to your home. Check to see if handrails are loose  or broken and that both sides of all steps have handrails. Install guardrails along the edges of any raised decks and porches. Have leaves, snow, or ice cleared regularly. Use sand, salt, or ice melter on paths if you live where there is ice and snow during the winter. Clean up any spills in your garage right away. This includes grease or oil spills. What other actions can I take? Review your medicines with your doctor. Some medicines can cause dizziness or changes in blood pressure, which increase your risk of falling. Wear shoes that: Have a low heel. Do not wear high heels. Have rubber bottoms and are closed at the toe. Feel good on your feet and fit well. Use tools that help you move around if needed. These include: Canes. Walkers. Scooters. Crutches. Ask  your doctor what else you can do to help prevent falls. This may include seeing a physical therapist to learn to do exercises to move better and get stronger. Where to find more information Centers for Disease Control and Prevention, [employer]: TonerPromos.no [employer] on Aging: BaseRingTones.pl National Institute on Aging: BaseRingTones.pl Contact a doctor if: You are afraid of falling at home. You feel weak, drowsy, or dizzy at home. You fall at home. Get help right away if you: Lose consciousness or have trouble moving after a fall. Have a fall that causes a head injury. These symptoms may be an emergency. Get help right away. Call 911. Do not wait to see if the symptoms will go away. Do not drive yourself to the hospital. This information is not intended to replace advice given to you by your health care provider. Make sure you discuss any questions you have with your health care provider. Document Revised: 04/17/2022 Document Reviewed: 04/17/2022 Elsevier Patient Education  2024 [employer].

## 2024-04-22 ENCOUNTER — Ambulatory Visit: Payer: Self-pay | Admitting: Nurse Practitioner

## 2024-04-22 LAB — CMP14+EGFR
ALT: 18 IU/L (ref 0–32)
AST: 25 IU/L (ref 0–40)
Albumin: 4.5 g/dL (ref 3.7–4.7)
Alkaline Phosphatase: 82 IU/L (ref 44–121)
BUN/Creatinine Ratio: 15 (ref 12–28)
BUN: 15 mg/dL (ref 8–27)
Bilirubin Total: 0.5 mg/dL (ref 0.0–1.2)
CO2: 22 mmol/L (ref 20–29)
Calcium: 10.1 mg/dL (ref 8.7–10.3)
Chloride: 103 mmol/L (ref 96–106)
Creatinine, Ser: 0.98 mg/dL (ref 0.57–1.00)
Globulin, Total: 2.2 g/dL (ref 1.5–4.5)
Glucose: 94 mg/dL (ref 70–99)
Potassium: 4.8 mmol/L (ref 3.5–5.2)
Sodium: 140 mmol/L (ref 134–144)
Total Protein: 6.7 g/dL (ref 6.0–8.5)
eGFR: 55 mL/min/1.73 — ABNORMAL LOW (ref >59)

## 2024-04-22 LAB — CBC WITH DIFFERENTIAL/PLATELET
Basophils Absolute: 0.1 x10E3/uL (ref 0.0–0.2)
Basos: 1 %
EOS (ABSOLUTE): 0.2 x10E3/uL (ref 0.0–0.4)
Eos: 2 %
Hematocrit: 44.9 % (ref 34.0–46.6)
Hemoglobin: 14.5 g/dL (ref 11.1–15.9)
Immature Grans (Abs): 0 x10E3/uL (ref 0.0–0.1)
Immature Granulocytes: 0 %
Lymphocytes Absolute: 2.2 x10E3/uL (ref 0.7–3.1)
Lymphs: 25 %
MCH: 30.1 pg (ref 26.6–33.0)
MCHC: 32.3 g/dL (ref 31.5–35.7)
MCV: 93 fL (ref 79–97)
Monocytes Absolute: 0.7 x10E3/uL (ref 0.1–0.9)
Monocytes: 8 %
Neutrophils Absolute: 5.6 x10E3/uL (ref 1.4–7.0)
Neutrophils: 64 %
Platelets: 290 x10E3/uL (ref 150–450)
RBC: 4.81 x10E6/uL (ref 3.77–5.28)
RDW: 12.1 % (ref 11.7–15.4)
WBC: 8.8 x10E3/uL (ref 3.4–10.8)

## 2024-04-22 LAB — LIPID PANEL
Chol/HDL Ratio: 2.7 ratio (ref 0.0–4.4)
Cholesterol, Total: 177 mg/dL (ref 100–199)
HDL: 66 mg/dL (ref >39)
LDL Chol Calc (NIH): 88 mg/dL (ref 0–99)
Triglycerides: 135 mg/dL (ref 0–149)
VLDL Cholesterol Cal: 23 mg/dL (ref 5–40)

## 2024-04-30 DIAGNOSIS — L309 Dermatitis, unspecified: Secondary | ICD-10-CM | POA: Diagnosis not present

## 2024-04-30 DIAGNOSIS — Z85828 Personal history of other malignant neoplasm of skin: Secondary | ICD-10-CM | POA: Diagnosis not present

## 2024-04-30 DIAGNOSIS — L820 Inflamed seborrheic keratosis: Secondary | ICD-10-CM | POA: Diagnosis not present

## 2024-04-30 DIAGNOSIS — L570 Actinic keratosis: Secondary | ICD-10-CM | POA: Diagnosis not present

## 2024-04-30 DIAGNOSIS — D485 Neoplasm of uncertain behavior of skin: Secondary | ICD-10-CM | POA: Diagnosis not present

## 2024-04-30 DIAGNOSIS — Z08 Encounter for follow-up examination after completed treatment for malignant neoplasm: Secondary | ICD-10-CM | POA: Diagnosis not present

## 2024-04-30 DIAGNOSIS — L568 Other specified acute skin changes due to ultraviolet radiation: Secondary | ICD-10-CM | POA: Diagnosis not present

## 2024-04-30 DIAGNOSIS — L538 Other specified erythematous conditions: Secondary | ICD-10-CM | POA: Diagnosis not present

## 2024-07-14 ENCOUNTER — Ambulatory Visit: Payer: Medicare Other

## 2024-07-14 VITALS — BP 141/68 | HR 94 | Ht 64.0 in | Wt 137.0 lb

## 2024-07-14 DIAGNOSIS — Z0000 Encounter for general adult medical examination without abnormal findings: Secondary | ICD-10-CM

## 2024-07-14 NOTE — Progress Notes (Signed)
 Chief Complaint  Patient presents with   Medicare Wellness     Subjective:   Christina Silva is a 89 y.o. female who presents for a Medicare Annual Wellness Visit.  Allergies (verified) Codeine, Lisinopril , Morphine, Other, Sulfa antibiotics, and Sulfonamide derivatives   History: Past Medical History:  Diagnosis Date   Anxiety    Cataract    GERD (gastroesophageal reflux disease)    Hyperlipidemia    Hypertension    SCCA (squamous cell carcinoma) of skin 11/02/2020   Mid Chest (in situ) (tx p bx)   SCCA (squamous cell carcinoma) of skin 11/02/2020   Right Forearm Posterior (in situ) (tx p bx)   SCCA (squamous cell carcinoma) of skin 11/02/2020   Left Submandibular Area (in situ) (tx p bx)   Stroke (HCC) 09/2018   Vertigo    patient denies   Past Surgical History:  Procedure Laterality Date   ABDOMINAL HYSTERECTOMY  1980   CATARACT EXTRACTION Right    CHOLECYSTECTOMY N/A 12/16/2019   Procedure: LAPAROSCOPIC CHOLECYSTECTOMY WITH INTRAOPERATIVE CHOLANGIOGRAM;  Surgeon: Vanderbilt Ned, MD;  Location: [facility] OR;  Service: General;  Laterality: N/A;   EYE SURGERY     implantable loop recorder placement  11/12/2020    Medtronic Reveal Linq model [id number] implantable loop recorder (SN [id number]) implanted by Dr Kelsie for cryptogenic stroke   KNEE ARTHROSCOPY Left    ROTATOR CUFF REPAIR Left    SQUAMOUS CELL CARCINOMA EXCISION Left    TONSILLECTOMY     Family History  Problem Relation Age of Onset   Heart attack Mother    Hyperlipidemia Mother    Heart disease Mother    Transient ischemic attack Father    Stroke Father    Psychiatric Illness Sister    Heart disease Sister    Arthritis Brother    Epilepsy Brother    Colon cancer Neg Hx    Esophageal cancer Neg Hx    Rectal cancer Neg Hx    Stomach cancer Neg Hx    Pancreatic cancer Neg Hx    Social History   Occupational History   Occupation: retired  Tobacco Use   Smoking status: Former    Current packs/day:  0.00    Types: Cigarettes    Quit date: 01/09/1983    Years since quitting: 41.5   Smokeless tobacco: Never  Vaping Use   Vaping status: Never Used  Substance and Sexual Activity   Alcohol use: No   Drug use: No   Sexual activity: Yes   Tobacco Counseling Counseling given: Yes  SDOH Screenings   Food Insecurity: No Food Insecurity (07/14/2024)  Housing: Unknown (07/14/2024)  Transportation Needs: No Transportation Needs (07/14/2024)  Utilities: Not At Risk (07/14/2024)  Alcohol Screen: Low Risk  (07/12/2023)  Depression (PHQ2-9): Low Risk  (07/14/2024)  Financial Resource Strain: Low Risk  (07/12/2023)  Physical Activity: Insufficiently Active (07/14/2024)  Social Connections: Moderately Integrated (07/14/2024)  Stress: No Stress Concern Present (07/14/2024)  Tobacco Use: Medium Risk (07/14/2024)  Health Literacy: Adequate Health Literacy (07/14/2024)   See flowsheets for full screening details  Depression Screen PHQ 2 & 9 Depression Scale- Over the past 2 weeks, how often have you been bothered by any of the following problems? Little interest or pleasure in doing things: 0 Feeling down, depressed, or hopeless (PHQ Adolescent also includes...irritable): 0 PHQ-2 Total Score: 0 Trouble falling or staying asleep, or sleeping too much: 0 Feeling tired or having little energy: 0 Poor appetite or overeating (PHQ Adolescent also  includes...weight loss): 0 Feeling bad about yourself - or that you are a failure or have let yourself or your family down: 0 Trouble concentrating on things, such as reading the newspaper or watching television (PHQ Adolescent also includes...like school work): 0 Moving or speaking so slowly that other people could have noticed. Or the opposite - being so fidgety or restless that you have been moving around a lot more than usual: 0 Thoughts that you would be better off dead, or of hurting yourself in some way: 0 PHQ-9 Total Score: 0 If you checked off  any problems, how difficult have these problems made it for you to do your work, take care of things at home, or get along with other people?: Not difficult at all  Depression Treatment Depression Interventions/Treatment : Currently on Treatment     Goals Addressed             This Visit's Progress    DIET - EAT MORE FRUITS AND VEGETABLES   On track    Prevent falls   On track    Study surroundings carefully       Visit info / Clinical Intake: Medicare Wellness Visit Type:: Subsequent Annual Wellness Visit Persons participating in visit:: patient Medicare Wellness Visit Mode:: Telephone If telephone:: video declined Because this visit was a virtual/telehealth visit:: vitals recorded from last visit If Telephone or Video please confirm:: I connected with the patient using audio enabled telemedicine application and verified that I am speaking with the correct person using two identifiers Patient Location:: home Provider Location:: office Information given by:: patient Interpreter Needed?: No Pre-visit prep was completed: yes AWV questionnaire completed by patient prior to visit?: no Living arrangements:: (!) lives alone Patient's Overall Health Status Rating: very good Typical amount of pain: none Does pain affect daily life?: no Are you currently prescribed opioids?: no  Dietary Habits and Nutritional Risks How many meals a day?: 3 Eats fruit and vegetables daily?: yes Most meals are obtained by: preparing own meals In the last 2 weeks, have you had any of the following?: none Diabetic:: no  Functional Status Activities of Daily Living (to include ambulation/medication): Independent Ambulation: Independent with device- listed below Home Assistive Devices/Equipment: Walker (specify Type); Cane Medication Administration: Independent Home Management: Independent Manage your own finances?: (!) no (daughter) Primary transportation is: family/friends (pts  daughter/neighbors) Concerns about hearing?: (!) yes Uses hearing aids?: no  Fall Screening Falls in the past year?: 0 Number of falls in past year: 0 Was there an injury with Fall?: 0 Fall Risk Category Calculator: 0 Patient Fall Risk Level: Low Fall Risk  Fall Risk Patient at Risk for Falls Due to: No Fall Risks Fall risk Follow up: Falls evaluation completed; Education provided  Home and Transportation Safety: All rugs have non-skid backing?: yes All stairs or steps have railings?: yes Grab bars in the bathtub or shower?: yes Have non-skid surface in bathtub or shower?: yes Good home lighting?: yes Regular seat belt use?: yes Hospital stays in the last year:: no  Cognitive Assessment Difficulty concentrating, remembering, or making decisions? : yes (some) Will 6CIT or Mini Cog be Completed: yes What year is it?: 0 points What month is it?: 0 points Give patient an address phrase to remember (5 components): [address] [place] About what time is it?: 0 points Count backwards from 20 to 1: 0 points Say the months of the year in reverse: 0 points Repeat the address phrase from earlier: 0 points 6  CIT Score: 0 points  Advance Directives (For Healthcare) Does Patient Have a Medical Advance Directive?: No Would patient like information on creating a medical advance directive?: No - Patient declined  Reviewed/Updated  Reviewed/Updated: Reviewed All (Medical, Surgical, Family, Medications, Allergies, Care Teams, Patient Goals); Medical History; Surgical History; Family History; Medications; Allergies; Care Teams; Patient Goals        Objective:    Today's Vitals   07/14/24 1211  BP: (!) 141/68  Pulse: 94  Weight: 137 lb (62.1 kg)  Height: 5' 4 (1.626 m)   Body mass index is 23.52 kg/m.  Current Medications (verified) Outpatient Encounter Medications as of 07/14/2024  Medication Sig   acetaminophen  (TYLENOL ) 325 MG tablet Take 650 mg by mouth every 6 (six)  hours as needed for mild pain (pain score 1-3).   aspirin  EC 81 MG tablet Take 81 mg by mouth daily. Swallow whole.   cholecalciferol (VITAMIN D) 25 MCG (1000 UNIT) tablet Take 2,000 Units by mouth daily.   diltiazem  (CARDIZEM  CD) 120 MG 24 hr capsule Take 1 capsule (120 mg total) by mouth daily.   escitalopram  (LEXAPRO ) 20 MG tablet Take 1 tablet (20 mg total) by mouth daily.   HYDROcodone -acetaminophen  (NORCO/VICODIN) 5-325 MG tablet Take 1 tablet by mouth every 6 (six) hours as needed.   ibuprofen  (ADVIL ) 200 MG tablet Take 200 mg by mouth every 6 (six) hours as needed for mild pain.   losartan  (COZAAR ) 100 MG tablet Take 1 tablet (100 mg total) by mouth daily.   Multiple Vitamin (MULTIVITAMIN) tablet Take 1 tablet by mouth daily.   pantoprazole  (PROTONIX ) 40 MG tablet Take 1 tablet (40 mg total) by mouth daily.   Pyridoxine HCl (VITAMIN B6 PO) Take by mouth daily.   rosuvastatin  (CRESTOR ) 20 MG tablet Take 1 tablet (20 mg total) by mouth daily.   No facility-administered encounter medications on file as of 07/14/2024.   Hearing/Vision screen Hearing Screening - Comments:: Pt have hearing dif/have hearing appt  Vision Screening - Comments:: Pt wear glasses and have vision dif even w/glasses/Dr. Octavia in [place], [place]/last ov 2025 Immunizations and Health Maintenance Health Maintenance  Topic Date Due   COVID-19 Vaccine (4 - 2025-26 season) 04/28/2024   Influenza Vaccine  11/25/2024 (Originally 03/28/2024)   Medicare Annual Wellness (AWV)  07/14/2025   DTaP/Tdap/Td (3 - Td or Tdap) 04/21/2032   Pneumococcal Vaccine: 50+ Years  Completed   DEXA SCAN  Completed   Zoster Vaccines- Shingrix   Completed   Meningococcal B Vaccine  Aged Out        Assessment/Plan:  This is a routine wellness examination for Christina Silva.  Patient Care Team: Gladis Mustard, FNP as PCP - General (Nurse Practitioner) Jordan, Peter M, MD as PCP - Cardiology (Cardiology) Milton Lade, MD as Consulting  Physician (Neurology) Jordan, Peter M, MD as Consulting Physician (Cardiology) Sheffield, Andrez SAUNDERS, PA-C (Inactive) as Physician Assistant (Dermatology)  I have personally reviewed and noted the following in the patient's chart:   Medical and social history Use of alcohol, tobacco or illicit drugs  Current medications and supplements including opioid prescriptions. Functional ability and status Nutritional status Physical activity Advanced directives List of other physicians Hospitalizations, surgeries, and ER visits in previous 12 months Vitals Screenings to include cognitive, depression, and falls Referrals and appointments  No orders of the defined types were placed in this encounter.  In addition, I have reviewed and discussed with patient certain preventive protocols, quality metrics, and best practice recommendations. A written personalized  care plan for preventive services as well as general preventive health recommendations were provided to patient.   Ozie Ned, CMA   07/14/2024   Return in 1 year (on 07/14/2025).  After Visit Summary: (MyChart) Due to this being a telephonic visit, the after visit summary with patients personalized plan was offered to patient via MyChart   Nurse Notes: n/a

## 2024-07-22 ENCOUNTER — Ambulatory Visit (INDEPENDENT_AMBULATORY_CARE_PROVIDER_SITE_OTHER): Admitting: Otolaryngology

## 2024-07-28 ENCOUNTER — Ambulatory Visit: Admitting: Nurse Practitioner

## 2024-07-28 ENCOUNTER — Encounter: Payer: Self-pay | Admitting: Nurse Practitioner

## 2024-07-28 VITALS — BP 156/77 | HR 64 | Temp 96.5°F | Ht 64.0 in | Wt 141.0 lb

## 2024-07-28 DIAGNOSIS — R531 Weakness: Secondary | ICD-10-CM | POA: Diagnosis not present

## 2024-07-28 DIAGNOSIS — H938X3 Other specified disorders of ear, bilateral: Secondary | ICD-10-CM

## 2024-07-28 DIAGNOSIS — Z23 Encounter for immunization: Secondary | ICD-10-CM | POA: Diagnosis not present

## 2024-07-28 NOTE — Addendum Note (Signed)
 Addended by: GLADIS MUSTARD on: 07/28/2024 02:26 PM   Modules accepted: Level of Service

## 2024-07-28 NOTE — Patient Instructions (Signed)
 Christina Silva

## 2024-07-28 NOTE — Progress Notes (Signed)
   Subjective:    Patient ID: Christina Silva, female    DOB: [birth year], 89 y.o.   MRN: [id number]   Chief Complaint: Ears feel full   HPI  Patient comes in stating that her ears feel full and her hearing has gotten a little worse. Denies pain or drainage.  Patient Active Problem List   Diagnosis Date Noted   Depression, major, single episode, moderate (HCC) 10/04/2021   Refractory migraine with aura 06/28/2021   History of CVA (cerebrovascular accident) 10/18/2018   Chondrocalcinosis of right knee 10/16/2018   Osteopenia 05/24/2015   GERD (gastroesophageal reflux disease) 01/08/2013   Hyperlipidemia with target LDL less than 100 01/08/2013   HTN (hypertension) 01/08/2013   Anxiety state 01/08/2013        Review of Systems  Constitutional:  Negative for diaphoresis.  Eyes:  Negative for pain.  Respiratory:  Negative for shortness of breath.   Cardiovascular:  Negative for chest pain, palpitations and leg swelling.  Gastrointestinal:  Negative for abdominal pain.  Endocrine: Negative for polydipsia.  Skin:  Negative for rash.  Neurological:  Negative for dizziness, weakness and headaches.  Hematological:  Does not bruise/bleed easily.  All other systems reviewed and are negative.      Objective:   Physical Exam Constitutional:      Appearance: Normal appearance.  HENT:     Right Ear: Tympanic membrane and ear canal normal.     Left Ear: Tympanic membrane and ear canal normal.     Mouth/Throat:     Mouth: Mucous membranes are moist.     Pharynx: No oropharyngeal exudate or posterior oropharyngeal erythema.  Cardiovascular:     Rate and Rhythm: Normal rate and regular rhythm.     Heart sounds: Normal heart sounds.  Pulmonary:     Effort: Pulmonary effort is normal.     Breath sounds: Normal breath sounds.  Skin:    General: Skin is warm.  Neurological:     General: No focal deficit present.     Mental Status: She is alert and oriented to person, place, and  time.  Psychiatric:        Mood and Affect: Mood normal.        Behavior: Behavior normal.    BP (!) 156/77   Pulse 64   Temp (!) 96.5 F (35.8 C) (Temporal)   Ht 5' 4 (1.626 m)   Wt 141 lb (64 kg)   SpO2 95%   BMI 24.20 kg/m         Assessment & Plan:   Christina Silva in today with chief complaint of Ears feel full   1. Congestion of both ears (Primary) Ears are clear Keep appointment with ENT as scheduled.    The above assessment and management plan was discussed with the patient. The patient verbalized understanding of and has agreed to the management plan. Patient is aware to call the clinic if symptoms persist or worsen. Patient is aware when to return to the clinic for a follow-up visit. Patient educated on when it is appropriate to go to the emergency department.   Mary-Margaret Gladis, FNP

## 2024-07-31 ENCOUNTER — Ambulatory Visit: Admitting: Nurse Practitioner

## 2024-09-02 ENCOUNTER — Telehealth: Payer: Self-pay | Admitting: Nurse Practitioner

## 2024-09-02 DIAGNOSIS — Z8673 Personal history of transient ischemic attack (TIA), and cerebral infarction without residual deficits: Secondary | ICD-10-CM

## 2024-09-02 NOTE — Telephone Encounter (Unsigned)
 Copied from CRM #[id number]. Topic: Referral - Request for Referral >> Sep 02, 2024  3:29 PM Zebedee SAUNDERS wrote: Did the patient discuss referral with their provider in the last year? Yes (If No - schedule appointment) (If Yes - send message)  Appointment offered? Yes  Type of order/referral and detailed reason for visit: Kelli R. Sheffield    Preference of office, provider, location: [facility] [address], [place], [place] [phone]  If referral order, have you been seen by this specialty before? Yes (If Yes, this issue or another issue? When? Where?  Can we respond through MyChart? Yes

## 2024-09-02 NOTE — Telephone Encounter (Unsigned)
 Copied from CRM [id number]. Topic: Referral - Request for Referral >> Sep 02, 2024  3:38 PM Zebedee SAUNDERS wrote: Did the patient discuss referral with their provider in the last year? Yes (If No - schedule appointment) (If Yes - send message)  Appointment offered? Yes  Type of order/referral and detailed reason for visit: Dr. Avis P. Rosemarie, MD  Preference of office, provider, location: [facility]  [address] [place], [place] [address] [phone]  If referral order, have you been seen by this specialty before? Yes (If Yes, this issue or another issue? When? Where?several years  Can we respond through MyChart? Yes

## 2024-09-02 NOTE — Telephone Encounter (Signed)
 Please review and advise.

## 2024-09-11 ENCOUNTER — Ambulatory Visit (INDEPENDENT_AMBULATORY_CARE_PROVIDER_SITE_OTHER): Payer: Self-pay | Admitting: Audiology

## 2024-09-11 ENCOUNTER — Ambulatory Visit (INDEPENDENT_AMBULATORY_CARE_PROVIDER_SITE_OTHER): Admitting: Otolaryngology

## 2024-09-11 ENCOUNTER — Encounter (INDEPENDENT_AMBULATORY_CARE_PROVIDER_SITE_OTHER): Payer: Self-pay | Admitting: Otolaryngology

## 2024-09-11 ENCOUNTER — Ambulatory Visit (INDEPENDENT_AMBULATORY_CARE_PROVIDER_SITE_OTHER): Admitting: Audiology

## 2024-09-11 VITALS — BP 136/69 | HR 78 | Ht 64.0 in | Wt 135.0 lb

## 2024-09-11 DIAGNOSIS — R42 Dizziness and giddiness: Secondary | ICD-10-CM | POA: Diagnosis not present

## 2024-09-11 DIAGNOSIS — H903 Sensorineural hearing loss, bilateral: Secondary | ICD-10-CM | POA: Diagnosis not present

## 2024-09-11 NOTE — Progress Notes (Signed)
" °  [address] [place], [place] [address] [phone]  Audiological Evaluation    Name: Christina Silva     DOB:   March 09, 1933      MRN:   [id number]                                                                                     Service Date: 09/11/2024     Accompanied by: self    Patient comes today after Dr. Tobie, ENT sent a referral for a hearing evaluation due to concerns with hearing loss.   Symptoms Yes Details  Hearing loss  [x]  Maybe a little change.  07-10-23:Right ear: Normal hearing sensitivity from [phone] Hz sloping to moderately-severe sensorineural hearing loss from 2000-8000 Hz. Left ear: Normal hearing sensitivity from [phone] Hz sloping to severe sensorineural hearing loss from 2000-8000 Hz.  Tinnitus  []  none  Ear pain/ infections/pressure  [x]  Some pain behind her right ear  Balance problems  []    Noise exposure history  [x]  Occupational    Previous ear surgeries  []  none  Family history of hearing loss  []  none  Amplification  []  none  Other  []      Otoscopy: Right ear: Clear external ear canal and notable landmarks visualized on the tympanic membrane. Left ear:  Clear external ear canal and notable landmarks visualized on the tympanic membrane.  Tympanometry: Right ear: Type A - Normal external ear canal volume with normal middle ear pressure and normal tympanic membrane compliance. Findings are consistent with normal middle ear function. Left ear: Type A - Normal external ear canal volume with normal middle ear pressure and normal tympanic membrane compliance. Findings are consistent with normal middle ear function.   Hearing Evaluation The hearing test results were completed under headphones and results are deemed to be of good reliability. Test technique:  conventional    Pure tone Audiometry: Right ear- Normal sloping to severe sensorineural hearing loss from 250 Hz - 8000 Hz. Left ear-  Normal sloping to severe sensorineural  hearing loss from 250 Hz - 8000 Hz.  Speech Audiometry: Right ear- Speech Reception Threshold (SRT) was obtained at 20 dBHL. Left ear-Speech Reception Threshold (SRT) was obtained at 20 dBHL.   Word Recognition Score Tested using NU-6 (recorded) Right ear: 84% was obtained at a presentation level of 75 dBHL with contralateral masking which is deemed as  good . Left ear: 96% was obtained at a presentation level of 75 dBHL with contralateral masking which is deemed as  excellent.   Impression: There is not a significant difference in pure-tone thresholds between ears., There is not a significant difference in the word recognition score in between ears.    Recommendations: Follow up with ENT as scheduled. Return for a hearing evaluation if concerns with hearing changes arise or per MD recommendation. Consider a communication needs assessment for amplification after medical clearance is obtained, if needed.   Adriaan Maltese MARIE LEROUX-MARTINEZ, AUD  "

## 2024-09-11 NOTE — Progress Notes (Signed)
 Dear Dr. Gladis, Here is my assessment for our mutual patient, Christina Silva. Thank you for allowing me the opportunity to care for your patient. Please do not hesitate to contact me should you have any other questions. Sincerely, Dr. Eldora Blanch  Otolaryngology Clinic Note Referring provider: Dr. Gladis HPI:  Christina Silva is a 89 y.o. female kindly referred by Dr. Gladis for evaluation of vertigo, and hearing loss  Initial visit (2024): She reports that she has rare vertigo - associated with head turning (like looking up to put drops in her eyes or turning over in bed). Happens maybe once every two weeks. Happens for a minute or so, and then stops. She has been taking Meclizine  but reports it does not help.  She did try Epley prescribed by her PCP and it did help some  She reports some hearing loss lonstanding, but not significant change (just gradually declined) Does not use hearing aids.  Patient denies: ear pain, fullness, drainage. Patient additionally denies: deep pain in ear canal, eustachian tube symptoms such as popping, crackling, sensitive to pressure changes Patient also denies barotrauma, ototoxic medication use Prior ear surgery: no  She does have significant noise exposure from when she was working in the past.  Patient has been Neurology for cryptogenic strokes and migraines. She is also seeing Cardiology for palpitations and lightheadedness, starting on amlodipine .  She has had multiple MRIs  --------------------------------------------------------- 09/11/2024 Reports hearing is declining and some ear fullness, now getting worse for about a year. Right feels like worse than left. No tinnitus. No vertigo. She did have an updated audio   H&N Surgery: denies Personal or FHx of bleeding dz or anesthesia difficulty: no   Independent Review of Additional Tests or Records:  MRI (04/2022): mastoids well aerated, no effusion. No obvious large retrocochlear lesions  noted   CTH (2023): Mastoids, ME well aertaed. No obvious otic capsule or cochlear bony pathology noted but cuts quite thick   CTA 04/09/2023 reviewed with respect to ears: msatoids and ME well aerated; no otic capsule pathology noted  07/10/2023 Audiogram was independently reviewed and interpreted by me and it reveals AU: normal downsloping to mod/mod-severe SNHL, symmetric; 88% word interpretation at 70dB; type A/A tympanogram    SNHL= Sensorineural hearing loss  08/2024 Audiogram was independently reviewed and interpreted by me and it reveals - A/A tymps; WRT 96% at 75dB and 84% at 75dB AD/AS; normal downsloping to mod/sev SNHL; some decline in mid-frequencies   SNHL= Sensorineural hearing loss  Christina Silva (07/28/2024): noted b/l ear congestion, ear exam without wax; Rx: ref to ENT  PMH/Meds/All/SocHx/FamHx/ROS:   Past Medical History:  Diagnosis Date   Anxiety    Cataract    GERD (gastroesophageal reflux disease)    Hyperlipidemia    Hypertension    SCCA (squamous cell carcinoma) of skin 11/02/2020   Mid Chest (in situ) (tx p bx)   SCCA (squamous cell carcinoma) of skin 11/02/2020   Right Forearm Posterior (in situ) (tx p bx)   SCCA (squamous cell carcinoma) of skin 11/02/2020   Left Submandibular Area (in situ) (tx p bx)   Stroke (HCC) 09/2018   Vertigo    patient denies     Past Surgical History:  Procedure Laterality Date   ABDOMINAL HYSTERECTOMY  1980   CATARACT EXTRACTION Right    CHOLECYSTECTOMY N/A 12/16/2019   Procedure: LAPAROSCOPIC CHOLECYSTECTOMY WITH INTRAOPERATIVE CHOLANGIOGRAM;  Surgeon: Vanderbilt Ned, MD;  Location: [facility] OR;  Service: General;  Laterality: N/A;   EYE  SURGERY     implantable loop recorder placement  11/12/2020    Medtronic Reveal Linq model [id number] implantable loop recorder (SN [id number]) implanted by Dr Kelsie for cryptogenic stroke   KNEE ARTHROSCOPY Left    ROTATOR CUFF REPAIR Left    SQUAMOUS CELL CARCINOMA EXCISION Left     TONSILLECTOMY      Family History  Problem Relation Age of Onset   Heart attack Mother    Hyperlipidemia Mother    Heart disease Mother    Transient ischemic attack Father    Stroke Father    Psychiatric Illness Sister    Heart disease Sister    Arthritis Brother    Epilepsy Brother    Colon cancer Neg Hx    Esophageal cancer Neg Hx    Rectal cancer Neg Hx    Stomach cancer Neg Hx    Pancreatic cancer Neg Hx      Social Connections: Moderately Integrated (07/14/2024)   Social Connection and Isolation Panel    Frequency of Communication with Friends and Family: More than three times a week    Frequency of Social Gatherings with Friends and Family: More than three times a week    Attends Religious Services: 1 to 4 times per year    Active Member of [employer] or Organizations: Yes    Attends Banker Meetings: More than 4 times per year    Marital Status: Widowed      Current Outpatient Medications:    acetaminophen  (TYLENOL ) 325 MG tablet, Take 650 mg by mouth every 6 (six) hours as needed for mild pain (pain score 1-3)., Disp: , Rfl:    aspirin  EC 81 MG tablet, Take 81 mg by mouth daily. Swallow whole., Disp: , Rfl:    cholecalciferol (VITAMIN D) 25 MCG (1000 UNIT) tablet, Take 2,000 Units by mouth daily., Disp: , Rfl:    Ciclopirox 0.77 % gel, SMARTSIG:1 Topical Daily, Disp: , Rfl:    diltiazem  (CARDIZEM  CD) 120 MG 24 hr capsule, Take 1 capsule (120 mg total) by mouth daily., Disp: 90 capsule, Rfl: 1   escitalopram  (LEXAPRO ) 20 MG tablet, Take 1 tablet (20 mg total) by mouth daily., Disp: 90 tablet, Rfl: 1   HYDROcodone -acetaminophen  (NORCO/VICODIN) 5-325 MG tablet, Take 1 tablet by mouth every 6 (six) hours as needed., Disp: 10 tablet, Rfl: 0   ibuprofen  (ADVIL ) 200 MG tablet, Take 200 mg by mouth every 6 (six) hours as needed for mild pain., Disp: , Rfl:    losartan  (COZAAR ) 100 MG tablet, Take 1 tablet (100 mg total) by mouth daily., Disp: 90 tablet, Rfl: 1    Multiple Vitamin (MULTIVITAMIN) tablet, Take 1 tablet by mouth daily., Disp: , Rfl:    pantoprazole  (PROTONIX ) 40 MG tablet, Take 1 tablet (40 mg total) by mouth daily., Disp: 90 tablet, Rfl: 1   Pyridoxine HCl (VITAMIN B6 PO), Take by mouth daily., Disp: , Rfl:    rosuvastatin  (CRESTOR ) 20 MG tablet, Take 1 tablet (20 mg total) by mouth daily., Disp: 90 tablet, Rfl: 1   Physical Exam:   BP 136/69 (BP Location: Left Arm, Patient Position: Sitting, Cuff Size: Normal)   Pulse 78   Ht 5' 4 (1.626 m)   Wt 135 lb (61.2 kg)   SpO2 90%   BMI 23.17 kg/m   Salient findings:  CN II-XII intact, facial sensation intact No gross nystagmus Given history and complaints, ear microscopy was indicated and performed for evaluation with findings as below in physical  exam section and in procedures; Bilateral EAC clear and TM intact with well pneumatized middle ear spaces No lesions of oral cavity/oropharynx; No obviously palpable neck masses/lymphadenopathy/thyromegaly No respiratory distress or stridor  Seprately Identifiable Procedures:  Procedure: Bilateral ear microscopy using microscope (CPT [phone]) Pre-procedure diagnosis: bilateral worsening hearing loss Post-procedure diagnosis: same Indication: see above; given patient's otologic complaints and history, for improved and comprehensive examination of external ear and tympanic membrane, bilateral otologic examination using microscope was performed. Prior to proceeding, verbal consent was obtained after discussion of R/B/A  Procedure: Patient was placed semi-recumbent. Both ear canals were examined using the microscope with findings above. Patient tolerated the procedure well.  Impression & Plans:  Zelia Yzaguirre is a 89 y.o. female with history of strokes, SVT, and migraines following neurology and cardiology now with:  1. Sensorineural hearing loss, bilateral   2. Vertigo    Intermittent vertigo which based on history appears to be consistent  with BPPV.  Now resolved. Prior MRIs and CT from otologic standpoint appear reassuring. She does have other non-otologic balance issues as well and migraines for which she follows neuro and cardiology.  Hearing getting worse --- with A/A tymps and some decline; given her sx, would recommend amplification, which she was provided with a resource sheet for. Tinnitus not a major issue  She will contact us  if she cannot find a place to get HA    Thank you for allowing me the opportunity to care for your patient. Please do not hesitate to contact me should you have any other questions.  Sincerely, Eldora Blanch, MD Otolarynoglogist (ENT), [facility] Phone: [phone] Fax: [phone]  09/11/2024, 2:55 PM   MDM:  Level 4 - 99214 Problem: chronic problem, worsening  Data complexity: mod - order/review test, review of note, independent imaging interpretation - Morbidity: low  - Prescription Drug prescribed or managed: n

## 2024-09-18 NOTE — Progress Notes (Unsigned)
 " Cardiology Office Note:    Date:  09/19/2024   ID:  Christina Silva, DOB [birth year], MRN [id number]  PCP:  Gladis Mustard, FNP   [facility] Providers Cardiologist:  Arria Naim, MD     Referring MD: Gladis Mustard, FNP   No chief complaint on file.   History of Present Illness:    Christina Silva is a 89 y.o. female with a hx of HTN, HLD, h/o CVA, GERD, anxiety and vertigo.  Patient was seen by Dr. Fabiha Rougeau in May 2019 for blood pressure management and lower extremity edema.  Echocardiogram at the time showed EF 55 to 60%, grade 1 DD, mild AI, mild to moderate MR/TR.  He February 2020, she suffered cryptogenic stroke and was placed on aspirin  and Plavix  for 1 month then aspirin  alone after that.  Repeat echocardiogram in February 2020 showed EF 60 to 65%, grade 1 DD, basal septal hypertrophy, mild AI, moderate aortic annular calcifications, mild mitral annular calcification.  She had a TIA in July 2020 and was restarted on Plavix .  Plavix  was later discontinued again by Dr. Rosemarie of neurology.  She was seen in August 2020 with atypical chest pain and negative troponin.  She was prescribed pantoprazole .  Follow-up Myoview  was normal.  She was started on amlodipine  for hypertension.  MRI of the brain obtained in January 2022 showed a new right posterior cerebral artery occlusion close to its origin and a stable appearance of moderate P1/P2 stenosis.  An implantable loop recorder was placed.  No arrhythmia noted to date.   She was seen this past year with complaints of palpitations. Event monitor showed some brief runs of SVT. She was started on diltiazem .   On follow up today she is doing very well. She does note occasional fluttering in her chest that doesn't last long. Walks and does her own house work. Feels well. Tolerating medication well. Notes BP at home typically 130/60.   Past Medical History:  Diagnosis Date   Anxiety    Cataract    GERD (gastroesophageal  reflux disease)    Hyperlipidemia    Hypertension    SCCA (squamous cell carcinoma) of skin 11/02/2020   Mid Chest (in situ) (tx p bx)   SCCA (squamous cell carcinoma) of skin 11/02/2020   Right Forearm Posterior (in situ) (tx p bx)   SCCA (squamous cell carcinoma) of skin 11/02/2020   Left Submandibular Area (in situ) (tx p bx)   Stroke (HCC) 09/2018   Vertigo    patient denies    Past Surgical History:  Procedure Laterality Date   ABDOMINAL HYSTERECTOMY  1980   CATARACT EXTRACTION Right    CHOLECYSTECTOMY N/A 12/16/2019   Procedure: LAPAROSCOPIC CHOLECYSTECTOMY WITH INTRAOPERATIVE CHOLANGIOGRAM;  Surgeon: Vanderbilt Ned, MD;  Location: [facility] OR;  Service: General;  Laterality: N/A;   EYE SURGERY     implantable loop recorder placement  11/12/2020    Medtronic Reveal Linq model [id number] implantable loop recorder (SN [id number]) implanted by Dr Kelsie for cryptogenic stroke   KNEE ARTHROSCOPY Left    ROTATOR CUFF REPAIR Left    SQUAMOUS CELL CARCINOMA EXCISION Left    TONSILLECTOMY      Current Medications: Current Meds  Medication Sig   aspirin  EC 81 MG tablet Take 81 mg by mouth daily. Swallow whole.   cholecalciferol (VITAMIN D) 25 MCG (1000 UNIT) tablet Take 2,000 Units by mouth daily.   diltiazem  (CARDIZEM  CD) 120 MG 24 hr capsule Take 1 capsule (120 mg  total) by mouth daily.   escitalopram  (LEXAPRO ) 20 MG tablet Take 1 tablet (20 mg total) by mouth daily.   HYDROcodone -acetaminophen  (NORCO/VICODIN) 5-325 MG tablet Take 1 tablet by mouth every 6 (six) hours as needed.   ibuprofen  (ADVIL ) 200 MG tablet Take 200 mg by mouth every 6 (six) hours as needed for mild pain.   losartan  (COZAAR ) 100 MG tablet Take 1 tablet (100 mg total) by mouth daily.   Multiple Vitamin (MULTIVITAMIN) tablet Take 1 tablet by mouth daily.   mupirocin ointment (BACTROBAN) 2 % Apply 1 Application topically 3 (three) times daily.   pantoprazole  (PROTONIX ) 40 MG tablet Take 1 tablet (40 mg total) by mouth  daily.   Pyridoxine HCl (VITAMIN B6 PO) Take by mouth daily.   rosuvastatin  (CRESTOR ) 20 MG tablet Take 1 tablet (20 mg total) by mouth daily.     Allergies:   Codeine, Lisinopril , Morphine, Other, Sulfa antibiotics, and Sulfonamide derivatives   Social History   Socioeconomic History   Marital status: Widowed    Spouse name: Not on file   Number of children: 2   Years of education: 10   Highest education level: Not on file  Occupational History   Occupation: retired  Tobacco Use   Smoking status: Former    Current packs/day: 0.00    Types: Cigarettes    Quit date: 01/09/1983    Years since quitting: 41.7   Smokeless tobacco: Never  Vaping Use   Vaping status: Never Used  Substance and Sexual Activity   Alcohol use: No   Drug use: No   Sexual activity: Yes  Other Topics Concern   Not on file  Social History Narrative   Lives alone handicap accessible home - daughter in [place] and son in [place].   Right Handed   Drinks caffeine occassionally   Social Drivers of Health   Tobacco Use: Medium Risk (09/19/2024)   Patient History    Smoking Tobacco Use: Former    Smokeless Tobacco Use: Never    Passive Exposure: Not on file  Financial Resource Strain: Low Risk (07/12/2023)   Overall Financial Resource Strain (CARDIA)    Difficulty of Paying Living Expenses: Not hard at all  Food Insecurity: No Food Insecurity (07/14/2024)   Epic    Worried About Programme Researcher, Broadcasting/film/video in the Last Year: Never true    Ran Out of Food in the Last Year: Never true  Transportation Needs: No Transportation Needs (07/14/2024)   Epic    Lack of Transportation (Medical): No    Lack of Transportation (Non-Medical): No  Physical Activity: Insufficiently Active (07/14/2024)   Exercise Vital Sign    Days of Exercise per Week: 3 days    Minutes of Exercise per Session: 30 min  Stress: No Stress Concern Present (07/14/2024)   [employer] of Occupational Health - Occupational Stress  Questionnaire    Feeling of Stress: Not at all  Social Connections: Moderately Integrated (07/14/2024)   Social Connection and Isolation Panel    Frequency of Communication with Friends and Family: More than three times a week    Frequency of Social Gatherings with Friends and Family: More than three times a week    Attends Religious Services: 1 to 4 times per year    Active Member of [employer] or Organizations: Yes    Attends Banker Meetings: More than 4 times per year    Marital Status: Widowed  Depression (PHQ2-9): Low Risk (07/28/2024)   Depression (PHQ2-9)  PHQ-2 Score: 2  Alcohol Screen: Low Risk (07/12/2023)   Alcohol Screen    Last Alcohol Screening Score (AUDIT): 0  Housing: Unknown (07/14/2024)   Epic    Unable to Pay for Housing in the Last Year: No    Number of Times Moved in the Last Year: Not on file    Homeless in the Last Year: No  Utilities: Not At Risk (07/14/2024)   Epic    Threatened with loss of utilities: No  Health Literacy: Adequate Health Literacy (07/14/2024)   B1300 Health Literacy    Frequency of need for help with medical instructions: Never     Family History: The patient's family history includes Arthritis in her brother; Epilepsy in her brother; Heart attack in her mother; Heart disease in her mother and sister; Hyperlipidemia in her mother; Psychiatric Illness in her sister; Stroke in her father; Transient ischemic attack in her father. There is no history of Colon cancer, Esophageal cancer, Rectal cancer, Stomach cancer, or Pancreatic cancer.  ROS:   Please see the history of present illness.     All other systems reviewed and are negative.  EKGs/Labs/Other Studies Reviewed:    The following studies were reviewed today:  Echo 10/18/2018 1. The left ventricle has normal systolic function with an ejection  fraction of 60-65%. The cavity size was normal. There is basal septal  hypertophy. Left ventricular diastolic Doppler parameters  are consistent  with impaired relaxation.   2. The right ventricle has normal systolic function. The cavity was  normal. There is no increase in right ventricular wall thickness.   3. The aortic valve is tricuspid Moderate thickening of the aortic valve  Moderate calcification of the aortic valve. Aortic valve regurgitation is  mild by color flow Doppler.. Moderate aortic annular calcification noted.   4. The mitral valve is normal in structure. Mild thickening of the mitral  valve leaflet. Mild calcification of the mitral valve leaflet. There is  mild mitral annular calcification present. No evidence of mitral valve  stenosis.   5. The tricuspid valve is normal in structure.   6. The pulmonic valve was grossly normal. Pulmonic valve regurgitation is  mild by color flow Doppler.   7. The aortic root is normal in size and structure.   8. Pulmonary hypertension is indeterminat,inadequate TR jet.   Cardiac event monitor 11/06/2022   Normal sinus rhythm   Several brief runs of nonsustained SVT - longest lasting 15 seconds.   Otherwise rare ectopy     Patch Wear Time:  3 days and 0 hours (2024-02-24T10:21:30-0500 to 2024-02-27T10:34:09-498)   Patient had a min HR of 46 bpm, max HR of 207 bpm, and avg HR of 71 bpm. Predominant underlying rhythm was Sinus Rhythm. 30 Supraventricular Tachycardia runs occurred, the run with the fastest interval lasting 8 beats with a max rate of 207 bpm, the  longest lasting 15.3 secs with an avg rate of 122 bpm. Isolated SVEs were rare (<1.0%), SVE Couplets were rare (<1.0%), and SVE Triplets were rare (<1.0%). Isolated VEs were rare (<1.0%, 431), VE Couplets were rare (<1.0%, 1), and VE Triplets were rare  (<1.0%, 2).   EKG Interpretation Date/Time:  Friday September 19 2024 13:21:15 EST Ventricular Rate:  72 PR Interval:  154 QRS Duration:  84 QT Interval:  418 QTC Calculation: 457 R Axis:   -35  Text Interpretation: Normal sinus rhythm Left axis  deviation Low voltage QRS Septal infarct , age undetermined When compared with ECG of  19-Jan-2023 14:07, No significant change was found Confirmed by Kimbrely Buckel [phone]) on 09/19/2024 1:32:41 PM    Recent Labs: 10/23/2023: TSH 2.200 04/21/2024: ALT 18; BUN 15; Creatinine, Ser 0.98; Hemoglobin 14.5; Platelets 290; Potassium 4.8; Sodium 140  Recent Lipid Panel    Component Value Date/Time   CHOL 177 04/21/2024 1421   TRIG 135 04/21/2024 1421   TRIG 149 02/26/2014 0930   HDL 66 04/21/2024 1421   HDL 68 02/26/2014 0930   CHOLHDL 2.7 04/21/2024 1421   CHOLHDL 3.6 10/18/2018 0625   VLDL 27 10/18/2018 0625   LDLCALC 88 04/21/2024 1421   LDLCALC 144 (H) 02/26/2014 0930     Risk Assessment/Calculations:           Physical Exam:    VS:  BP (!) 144/50 (BP Location: Left Arm, Patient Position: Sitting, Cuff Size: Normal)   Pulse 87   Ht 5' 4 (1.626 m)   Wt 140 lb 6.4 oz (63.7 kg)   SpO2 94%   BMI 24.10 kg/m     Wt Readings from Last 3 Encounters:  09/19/24 140 lb 6.4 oz (63.7 kg)  09/11/24 135 lb (61.2 kg)  07/28/24 141 lb (64 kg)     GEN:  Well nourished, well developed in no acute distress HEENT: Normal NECK: No JVD; No carotid bruits LYMPHATICS: No lymphadenopathy CARDIAC: RRR, no murmurs, rubs, gallops RESPIRATORY:  Clear to auscultation without rales, wheezing or rhonchi  ABDOMEN: Soft, non-tender, non-distended MUSCULOSKELETAL:  No edema; No deformity  SKIN: Warm and dry NEUROLOGIC:  Alert and oriented x 3 PSYCHIATRIC:  Normal affect   ASSESSMENT:    1. Primary hypertension   2. Palpitations   3. SVT (supraventricular tachycardia)       PLAN:    In order of problems listed above:   Hyperlipidemia: LDL 88 on Crestor  20 mg daily.  Given advanced age I would not increase further.   Hypertension: Blood pressure well controlled at home.   History of crytogenic stroke: No recurrence. No arrhythmia on ILR  4.   Palpitations. Brief SVT on monitor.  Clinically much better on diltiazem       Follow up in one year.  Medication Adjustments/Labs and Tests Ordered: Current medicines are reviewed at length with the patient today.  Concerns regarding medicines are outlined above.  Orders Placed This Encounter  Procedures   EKG 12-Lead   No orders of the defined types were placed in this encounter.   Patient Instructions  Medication Instructions:   *If you need a refill on your cardiac medications before your next appointment, please call your pharmacy*  Lab Work:    Testing/Procedures:   Follow-Up: At [facility], you and your health needs are our priority.  As part of our continuing mission to provide you with exceptional heart care, our providers are all part of one team.  This team includes your primary Cardiologist (physician) and Advanced Practice Providers or APPs (Physician Assistants and Nurse Practitioners) who all work together to provide you with the care you need, when you need it.  Your next appointment:      Provider:      We recommend signing up for the patient portal called MyChart.  Sign up information is provided on this After Visit Summary.  MyChart is used to connect with patients for Virtual Visits (Telemedicine).  Patients are able to view lab/test results, encounter notes, upcoming appointments, etc.  Non-urgent messages can be sent to your provider as well.  To learn more about what you can do with MyChart, go to [email or web address].              Signed, Chrsitopher Wik, MD  09/19/2024 1:38 PM    [facility]  "

## 2024-09-19 ENCOUNTER — Encounter: Payer: Self-pay | Admitting: Cardiology

## 2024-09-19 ENCOUNTER — Ambulatory Visit: Attending: Cardiology | Admitting: Cardiology

## 2024-09-19 ENCOUNTER — Encounter: Payer: Self-pay | Admitting: Audiology

## 2024-09-19 VITALS — BP 144/50 | HR 87 | Ht 64.0 in | Wt 140.4 lb

## 2024-09-19 DIAGNOSIS — R002 Palpitations: Secondary | ICD-10-CM | POA: Diagnosis not present

## 2024-09-19 DIAGNOSIS — I471 Supraventricular tachycardia, unspecified: Secondary | ICD-10-CM | POA: Diagnosis not present

## 2024-09-19 DIAGNOSIS — I10 Essential (primary) hypertension: Secondary | ICD-10-CM

## 2024-09-19 NOTE — Patient Instructions (Addendum)

## 2024-09-24 ENCOUNTER — Telehealth: Payer: Self-pay | Admitting: Family Medicine

## 2024-09-24 DIAGNOSIS — H539 Unspecified visual disturbance: Secondary | ICD-10-CM

## 2024-09-24 NOTE — Telephone Encounter (Signed)
 Referral placed

## 2024-09-24 NOTE — Telephone Encounter (Signed)
 Copied from CRM #[id number]. Topic: Referral - Request for Referral >> Sep 23, 2024  3:57 PM Zebedee SAUNDERS wrote: Did the patient discuss referral with their provider in the last year? Yes (If No - schedule appointment) (If Yes - send message)  Appointment offered? Yes  Type of order/referral and detailed reason for visit: Ophthalmologist   Preference of office, provider, location: [facility] Ophthalmologist  [address] [place], [place], [place] [address]  [phone]  If referral order, have you been seen by this specialty before? Yes (If Yes, this issue or another issue? When? Where?A year ago  Can we respond through MyChart? Yes

## 2024-10-21 ENCOUNTER — Ambulatory Visit: Payer: Self-pay | Admitting: Nurse Practitioner

## 2025-03-24 ENCOUNTER — Ambulatory Visit: Admitting: Adult Health
